# Patient Record
Sex: Female | Born: 1945
Health system: Southern US, Community
[De-identification: ages and names within clinical notes are randomized; demographics above are authoritative.]

## PROBLEM LIST (undated history)

## (undated) DIAGNOSIS — I10 Essential (primary) hypertension: Secondary | ICD-10-CM

## (undated) DIAGNOSIS — K219 Gastro-esophageal reflux disease without esophagitis: Secondary | ICD-10-CM

## (undated) DIAGNOSIS — N6019 Diffuse cystic mastopathy of unspecified breast: Secondary | ICD-10-CM

## (undated) DIAGNOSIS — R569 Unspecified convulsions: Secondary | ICD-10-CM

## (undated) DIAGNOSIS — K649 Unspecified hemorrhoids: Secondary | ICD-10-CM

## (undated) DIAGNOSIS — E785 Hyperlipidemia, unspecified: Secondary | ICD-10-CM

## (undated) DIAGNOSIS — N183 Chronic kidney disease, stage 3 unspecified: Secondary | ICD-10-CM

## (undated) DIAGNOSIS — R002 Palpitations: Secondary | ICD-10-CM

## (undated) DIAGNOSIS — G4733 Obstructive sleep apnea (adult) (pediatric): Secondary | ICD-10-CM

## (undated) DIAGNOSIS — F329 Major depressive disorder, single episode, unspecified: Secondary | ICD-10-CM

## (undated) DIAGNOSIS — R51 Headache: Secondary | ICD-10-CM

## (undated) DIAGNOSIS — F32A Depression, unspecified: Secondary | ICD-10-CM

## (undated) DIAGNOSIS — R519 Headache, unspecified: Secondary | ICD-10-CM

## (undated) HISTORY — PX: CARPAL TUNNEL RELEASE: SHX101

## (undated) HISTORY — DX: Gastro-esophageal reflux disease without esophagitis: K21.9

## (undated) HISTORY — DX: Essential (primary) hypertension: I10

## (undated) HISTORY — DX: Chronic kidney disease, stage 3 unspecified: N18.30

## (undated) HISTORY — PX: BREAST BIOPSY: SHX20

## (undated) HISTORY — DX: Diffuse cystic mastopathy of unspecified breast: N60.19

## (undated) HISTORY — PX: TRIGGER FINGER RELEASE: SHX641

## (undated) HISTORY — DX: Obstructive sleep apnea (adult) (pediatric): G47.33

## (undated) HISTORY — DX: Palpitations: R00.2

## (undated) HISTORY — DX: Unspecified hemorrhoids: K64.9

---
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
## 2004-09-21 ENCOUNTER — Encounter: Payer: Self-pay | Admitting: Family Medicine

## 2004-10-22 ENCOUNTER — Encounter: Payer: Self-pay | Admitting: Family Medicine

## 2004-11-21 ENCOUNTER — Encounter: Payer: Self-pay | Admitting: Family Medicine

## 2004-12-22 ENCOUNTER — Encounter: Payer: Self-pay | Admitting: Family Medicine

## 2004-12-22 HISTORY — PX: TOE SURGERY: SHX1073

## 2004-12-22 HISTORY — PX: ANKLE SURGERY: SHX546

## 2005-01-08 ENCOUNTER — Inpatient Hospital Stay: Payer: Self-pay | Admitting: Internal Medicine

## 2005-01-16 ENCOUNTER — Ambulatory Visit: Payer: Self-pay | Admitting: Obstetrics and Gynecology

## 2005-01-22 ENCOUNTER — Encounter: Payer: Self-pay | Admitting: Family Medicine

## 2005-02-19 ENCOUNTER — Encounter: Payer: Self-pay | Admitting: Family Medicine

## 2005-03-04 ENCOUNTER — Ambulatory Visit: Payer: Self-pay | Admitting: Obstetrics and Gynecology

## 2005-03-22 ENCOUNTER — Encounter: Payer: Self-pay | Admitting: Family Medicine

## 2005-03-24 ENCOUNTER — Inpatient Hospital Stay: Payer: Self-pay | Admitting: Specialist

## 2005-11-04 ENCOUNTER — Ambulatory Visit: Payer: Self-pay | Admitting: Internal Medicine

## 2005-12-11 ENCOUNTER — Ambulatory Visit: Payer: Self-pay | Admitting: Otolaryngology

## 2005-12-16 ENCOUNTER — Inpatient Hospital Stay: Payer: Self-pay | Admitting: Internal Medicine

## 2005-12-16 ENCOUNTER — Other Ambulatory Visit: Payer: Self-pay

## 2006-01-06 ENCOUNTER — Ambulatory Visit: Payer: Self-pay | Admitting: Internal Medicine

## 2006-01-16 ENCOUNTER — Ambulatory Visit: Payer: Self-pay

## 2006-01-29 ENCOUNTER — Ambulatory Visit: Payer: Self-pay | Admitting: Internal Medicine

## 2006-02-09 ENCOUNTER — Ambulatory Visit: Payer: Self-pay | Admitting: Internal Medicine

## 2006-02-13 ENCOUNTER — Ambulatory Visit: Payer: Self-pay | Admitting: Internal Medicine

## 2006-03-02 ENCOUNTER — Ambulatory Visit: Payer: Self-pay | Admitting: Internal Medicine

## 2006-04-02 ENCOUNTER — Inpatient Hospital Stay: Payer: Self-pay | Admitting: Internal Medicine

## 2006-04-02 ENCOUNTER — Other Ambulatory Visit: Payer: Self-pay

## 2006-04-18 ENCOUNTER — Ambulatory Visit: Payer: Self-pay | Admitting: Neurology

## 2006-04-27 ENCOUNTER — Encounter: Payer: Self-pay | Admitting: Internal Medicine

## 2006-05-22 ENCOUNTER — Encounter: Payer: Self-pay | Admitting: Internal Medicine

## 2006-06-21 ENCOUNTER — Encounter: Payer: Self-pay | Admitting: Internal Medicine

## 2006-07-22 ENCOUNTER — Encounter: Payer: Self-pay | Admitting: Internal Medicine

## 2006-08-22 ENCOUNTER — Encounter: Payer: Self-pay | Admitting: Internal Medicine

## 2006-09-21 ENCOUNTER — Encounter: Payer: Self-pay | Admitting: Internal Medicine

## 2006-10-22 ENCOUNTER — Encounter: Payer: Self-pay | Admitting: Internal Medicine

## 2006-11-21 ENCOUNTER — Encounter: Payer: Self-pay | Admitting: Internal Medicine

## 2006-11-23 ENCOUNTER — Ambulatory Visit: Payer: Self-pay | Admitting: Specialist

## 2006-12-22 ENCOUNTER — Encounter: Payer: Self-pay | Admitting: Internal Medicine

## 2006-12-22 HISTORY — PX: COLONOSCOPY: SHX174

## 2007-01-06 ENCOUNTER — Ambulatory Visit: Payer: Self-pay | Admitting: General Surgery

## 2007-01-22 ENCOUNTER — Encounter: Payer: Self-pay | Admitting: Internal Medicine

## 2007-02-20 ENCOUNTER — Encounter: Payer: Self-pay | Admitting: Internal Medicine

## 2007-02-24 ENCOUNTER — Ambulatory Visit: Payer: Self-pay | Admitting: Obstetrics and Gynecology

## 2007-03-09 ENCOUNTER — Encounter: Payer: Self-pay | Admitting: Specialist

## 2007-03-23 ENCOUNTER — Encounter: Payer: Self-pay | Admitting: Internal Medicine

## 2007-03-23 ENCOUNTER — Encounter: Payer: Self-pay | Admitting: Specialist

## 2007-04-12 ENCOUNTER — Ambulatory Visit: Payer: Self-pay | Admitting: Gastroenterology

## 2007-04-22 ENCOUNTER — Encounter: Payer: Self-pay | Admitting: Internal Medicine

## 2007-05-23 ENCOUNTER — Encounter: Payer: Self-pay | Admitting: Internal Medicine

## 2007-06-22 ENCOUNTER — Encounter: Payer: Self-pay | Admitting: Internal Medicine

## 2008-01-05 ENCOUNTER — Ambulatory Visit: Payer: Self-pay | Admitting: General Surgery

## 2008-02-24 ENCOUNTER — Encounter: Payer: Self-pay | Admitting: Specialist

## 2008-03-16 ENCOUNTER — Ambulatory Visit: Payer: Self-pay | Admitting: Specialist

## 2008-03-21 ENCOUNTER — Ambulatory Visit: Payer: Self-pay | Admitting: Internal Medicine

## 2008-03-22 ENCOUNTER — Encounter: Payer: Self-pay | Admitting: Specialist

## 2008-04-21 ENCOUNTER — Encounter: Payer: Self-pay | Admitting: Specialist

## 2008-07-17 ENCOUNTER — Ambulatory Visit: Payer: Self-pay | Admitting: Internal Medicine

## 2008-07-31 ENCOUNTER — Ambulatory Visit: Payer: Self-pay | Admitting: Internal Medicine

## 2008-08-04 ENCOUNTER — Ambulatory Visit: Payer: Self-pay | Admitting: Internal Medicine

## 2008-12-22 HISTORY — PX: MOUTH SURGERY: SHX715

## 2009-01-08 ENCOUNTER — Ambulatory Visit: Payer: Self-pay | Admitting: Obstetrics and Gynecology

## 2009-01-26 ENCOUNTER — Ambulatory Visit: Payer: Self-pay | Admitting: Internal Medicine

## 2009-03-21 ENCOUNTER — Encounter: Payer: Self-pay | Admitting: Internal Medicine

## 2009-03-22 ENCOUNTER — Encounter: Payer: Self-pay | Admitting: Internal Medicine

## 2009-04-21 ENCOUNTER — Encounter: Payer: Self-pay | Admitting: Internal Medicine

## 2009-05-22 ENCOUNTER — Encounter: Payer: Self-pay | Admitting: Internal Medicine

## 2009-08-01 ENCOUNTER — Other Ambulatory Visit: Payer: Self-pay | Admitting: Internal Medicine

## 2010-01-14 ENCOUNTER — Ambulatory Visit: Payer: Self-pay | Admitting: General Surgery

## 2010-01-17 ENCOUNTER — Ambulatory Visit: Payer: Self-pay | Admitting: Internal Medicine

## 2010-01-23 ENCOUNTER — Ambulatory Visit: Payer: Self-pay | Admitting: Internal Medicine

## 2010-07-18 ENCOUNTER — Other Ambulatory Visit: Payer: Self-pay | Admitting: Internal Medicine

## 2010-07-24 ENCOUNTER — Ambulatory Visit: Payer: Self-pay | Admitting: Internal Medicine

## 2011-01-27 ENCOUNTER — Other Ambulatory Visit: Payer: Self-pay | Admitting: Internal Medicine

## 2011-01-28 ENCOUNTER — Ambulatory Visit: Payer: Self-pay | Admitting: Internal Medicine

## 2011-04-02 ENCOUNTER — Ambulatory Visit: Payer: Self-pay | Admitting: General Practice

## 2012-01-27 ENCOUNTER — Other Ambulatory Visit: Payer: Self-pay | Admitting: Internal Medicine

## 2012-01-27 LAB — BASIC METABOLIC PANEL
Anion Gap: 9 (ref 7–16)
BUN: 15 mg/dL (ref 7–18)
Calcium, Total: 9 mg/dL (ref 8.5–10.1)
Chloride: 105 mmol/L (ref 98–107)
Co2: 28 mmol/L (ref 21–32)
Creatinine: 0.9 mg/dL (ref 0.60–1.30)
EGFR (Non-African Amer.): >60
Osmolality: 283 (ref 275–301)
Sodium: 142 mmol/L (ref 136–145)

## 2012-02-02 ENCOUNTER — Ambulatory Visit: Payer: Self-pay | Admitting: General Surgery

## 2012-02-04 ENCOUNTER — Ambulatory Visit: Payer: Self-pay | Admitting: Internal Medicine

## 2013-02-02 ENCOUNTER — Ambulatory Visit: Payer: Self-pay | Admitting: General Surgery

## 2014-02-09 ENCOUNTER — Ambulatory Visit: Payer: Self-pay | Admitting: General Surgery

## 2014-02-12 ENCOUNTER — Encounter: Payer: Self-pay | Admitting: General Surgery

## 2014-02-23 ENCOUNTER — Ambulatory Visit (INDEPENDENT_AMBULATORY_CARE_PROVIDER_SITE_OTHER): Payer: Medicare HMO | Admitting: General Surgery

## 2014-02-23 ENCOUNTER — Encounter: Payer: Self-pay | Admitting: General Surgery

## 2014-02-23 VITALS — BP 130/74 | HR 72 | Resp 14 | Ht 64.0 in | Wt 222.0 lb

## 2014-02-23 DIAGNOSIS — Z1239 Encounter for other screening for malignant neoplasm of breast: Secondary | ICD-10-CM

## 2014-02-23 DIAGNOSIS — N6019 Diffuse cystic mastopathy of unspecified breast: Secondary | ICD-10-CM

## 2014-02-23 NOTE — Patient Instructions (Addendum)
Patient to return to return in one year bilateral screening mammogram. Patient to continue to perform self breast exams monthy.

## 2014-02-23 NOTE — Progress Notes (Signed)
Patient ID: Abigail Moody, female   DOB: [birth year], 68 y.o.   MRN: [id number]  Chief Complaint  Patient presents with  . Follow-up    1 year follow up bilateral screening mammogram    HPI Abigail Moody is a 68 y.o. female who presents for a breast evaluation. The most recent mammogram was done on 02/08/14. Patient does perform regular self breast checks and gets regular mammograms done.    HPI  Past Medical History  Diagnosis Date  . Hypertension   . Diffuse cystic mastopathy   . GERD (gastroesophageal reflux disease)   . Hemorrhoid     Past Surgical History  Procedure Laterality Date  . Carpal tunnel release Bilateral   . Ankle surgery Right 2006  . Mouth surgery  2010  . Toe surgery Left 2006  . Breast biopsy Bilateral   . Tonsillectomy  1959  . Colonoscopy  2008    Dr. Epifanio Lesches  . Trigger finger release Right 2013-2014?    Family History  Problem Relation Age of Onset  . Cancer Father     Social History History  Substance Use Topics  . Smoking status: Never Smoker   . Smokeless tobacco: Never Used  . Alcohol Use: Yes    Allergies  Allergen Reactions  . Erythromycin Itching  . Levaquin [Levofloxacin] Other (See Comments)    Lethargic, no energy, cannot function for daily activities per patient.     Current Outpatient Prescriptions  Medication Sig Dispense Refill  . aspirin 325 MG tablet Take 325 mg by mouth daily.      . calcium-vitamin D 250-100 MG-UNIT per tablet Take 1 tablet by mouth 2 (two) times daily.      . fluticasone (FLONASE) 50 MCG/ACT nasal spray       . meloxicam (MOBIC) 15 MG tablet Take 15 mg by mouth daily.      . Multiple Vitamins-Minerals (MULTIVITAMIN WITH MINERALS) tablet Take 1 tablet by mouth daily.      Marland Kitchen PROAIR HFA 108 (90 BASE) MCG/ACT inhaler        No current facility-administered medications for this visit.    Review of Systems Review of Systems  Constitutional: Negative.   Respiratory: Negative.    Cardiovascular: Negative.     Blood pressure 130/74, pulse 72, resp. rate 14, height 5\' 4"  (1.626 m), weight 222 lb (100.699 kg).  Physical Exam Physical Exam  Constitutional: She is oriented to person, place, and time. She appears well-developed and well-nourished.  Eyes: Conjunctivae are normal.  Neck: Neck supple. No mass and no thyromegaly present.  Cardiovascular: Normal rate, regular rhythm and normal heart sounds.   Pulmonary/Chest: Breath sounds normal. Right breast exhibits no inverted nipple, no mass, no nipple discharge, no skin change and no tenderness. Left breast exhibits no inverted nipple, no mass, no nipple discharge, no skin change and no tenderness.  Lymphadenopathy:    She has no cervical adenopathy.    She has no axillary adenopathy.  Neurological: She is alert and oriented to person, place, and time.  Skin: Skin is warm and dry.    Data Reviewed Mammogram reviewed   Assessment    Stable exam FCD, prior biopsies    Plan    Patient to return to return in one year bilateral screening mammogram.       Bon Dowis G 02/24/2014, 6:28 AM

## 2014-02-24 ENCOUNTER — Encounter: Payer: Self-pay | Admitting: General Surgery

## 2014-02-24 DIAGNOSIS — N6019 Diffuse cystic mastopathy of unspecified breast: Secondary | ICD-10-CM | POA: Insufficient documentation

## 2014-03-02 ENCOUNTER — Ambulatory Visit: Payer: Self-pay | Admitting: Internal Medicine

## 2015-02-12 ENCOUNTER — Ambulatory Visit: Payer: Self-pay | Admitting: General Surgery

## 2015-02-22 ENCOUNTER — Encounter: Payer: Self-pay | Admitting: General Surgery

## 2015-02-22 ENCOUNTER — Ambulatory Visit (INDEPENDENT_AMBULATORY_CARE_PROVIDER_SITE_OTHER): Payer: PPO | Admitting: General Surgery

## 2015-02-22 VITALS — BP 124/82 | HR 64 | Resp 12 | Ht 64.0 in | Wt 212.0 lb

## 2015-02-22 DIAGNOSIS — Z87898 Personal history of other specified conditions: Secondary | ICD-10-CM | POA: Diagnosis not present

## 2015-02-22 DIAGNOSIS — Z1239 Encounter for other screening for malignant neoplasm of breast: Secondary | ICD-10-CM | POA: Diagnosis not present

## 2015-02-22 DIAGNOSIS — Z86018 Personal history of other benign neoplasm: Secondary | ICD-10-CM

## 2015-02-22 NOTE — Patient Instructions (Addendum)
Continue self breast exams. Call office for any new breast issues or concerns. Patient to return in one year with bilateral screening mammogram

## 2015-02-22 NOTE — Progress Notes (Signed)
Patient ID: Abigail Moody, female   DOB: 12-Oct-1946, 69 y.o.   MRN: [id number]  Chief Complaint  Patient presents with  . Follow-up    mammogram    HPI Abigail Moody is a 69 y.o. female.  who presents for her follow up breast evaluation. The most recent mammogram was done on 02-12-15.  Patient does perform regular self breast checks and gets regular mammograms done.  No new breast issues.  HPI  Past Medical History  Diagnosis Date  . Hypertension   . Diffuse cystic mastopathy   . GERD (gastroesophageal reflux disease)   . Hemorrhoid     Past Surgical History  Procedure Laterality Date  . Carpal tunnel release Bilateral   . Ankle surgery Right 2006  . Mouth surgery  2010  . Toe surgery Left 2006  . Breast biopsy Bilateral   . Tonsillectomy  1959  . Colonoscopy  2008    Dr. Epifanio Lesches  . Trigger finger release Right 2013-2014?    Family History  Problem Relation Age of Onset  . Cancer Father     prostate  . Cancer Brother     half brother/lung    Social History History  Substance Use Topics  . Smoking status: Never Smoker   . Smokeless tobacco: Never Used  . Alcohol Use: Yes    Allergies  Allergen Reactions  . Erythromycin Itching  . Levaquin [Levofloxacin] Other (See Comments)    Lethargic, no energy, cannot function for daily activities per patient.     Current Outpatient Prescriptions  Medication Sig Dispense Refill  . aspirin 325 MG tablet Take 325 mg by mouth daily.    . calcium-vitamin D 250-100 MG-UNIT per tablet Take 1 tablet by mouth 2 (two) times daily.    Marland Kitchen escitalopram (LEXAPRO) 10 MG tablet Take 10 mg by mouth daily.    . fluticasone (FLONASE) 50 MCG/ACT nasal spray     . meloxicam (MOBIC) 15 MG tablet Take 15 mg by mouth daily.    . Multiple Vitamins-Minerals (MULTIVITAMIN WITH MINERALS) tablet Take 1 tablet by mouth daily.    Marland Kitchen PROAIR HFA 108 (90 BASE) MCG/ACT inhaler      No current facility-administered medications for this visit.     Review of Systems Review of Systems  Constitutional: Negative.   Respiratory: Negative.   Cardiovascular: Negative.     Blood pressure 124/82, pulse 64, resp. rate 12, height 5\' 4"  (1.626 m), weight 212 lb (96.163 kg).  Physical Exam Physical Exam  Constitutional: She is oriented to person, place, and time. She appears well-developed and well-nourished.  Eyes: Conjunctivae are normal. No scleral icterus.  Neck: Neck supple.  Cardiovascular: Normal rate, regular rhythm and normal heart sounds.   Pulmonary/Chest: Effort normal and breath sounds normal. Right breast exhibits no inverted nipple, no mass, no nipple discharge, no skin change and no tenderness. Left breast exhibits no inverted nipple, no mass, no nipple discharge, no skin change and no tenderness.  Abdominal: Soft. Normal appearance and bowel sounds are normal. There is no hepatomegaly. There is no tenderness.  Lymphadenopathy:    She has no cervical adenopathy.    She has no axillary adenopathy.  Neurological: She is alert and oriented to person, place, and time.  Skin: Skin is warm and dry.  Multiple skin tags and keratotic lesions over trunk, neck-none suspicious  Data Reviewed Mammogram reviewed and stable.  Assessment    Stable physical exam. History of benign breast lumps.  Plan     Patient to return in one year with bilateral screening mammogram.         Junie Panning G 02/22/2015, 5:00 PM

## 2015-02-27 ENCOUNTER — Encounter: Payer: Self-pay | Admitting: General Surgery

## 2015-11-30 ENCOUNTER — Encounter: Payer: Self-pay | Admitting: Emergency Medicine

## 2015-11-30 ENCOUNTER — Emergency Department
Admission: EM | Admit: 2015-11-30 | Discharge: 2015-11-30 | Disposition: A | Discharge status: Home / Self Care | Payer: PRIVATE HEALTH INSURANCE | Attending: Emergency Medicine | Admitting: Emergency Medicine

## 2015-11-30 ENCOUNTER — Emergency Department: Payer: PRIVATE HEALTH INSURANCE

## 2015-11-30 DIAGNOSIS — I10 Essential (primary) hypertension: Secondary | ICD-10-CM | POA: Insufficient documentation

## 2015-11-30 DIAGNOSIS — M75101 Unspecified rotator cuff tear or rupture of right shoulder, not specified as traumatic: Secondary | ICD-10-CM | POA: Diagnosis not present

## 2015-11-30 DIAGNOSIS — S4991XA Unspecified injury of right shoulder and upper arm, initial encounter: Secondary | ICD-10-CM | POA: Diagnosis present

## 2015-11-30 DIAGNOSIS — Y9389 Activity, other specified: Secondary | ICD-10-CM | POA: Diagnosis not present

## 2015-11-30 DIAGNOSIS — Y9289 Other specified places as the place of occurrence of the external cause: Secondary | ICD-10-CM | POA: Insufficient documentation

## 2015-11-30 DIAGNOSIS — Y998 Other external cause status: Secondary | ICD-10-CM | POA: Insufficient documentation

## 2015-11-30 DIAGNOSIS — Z791 Long term (current) use of non-steroidal anti-inflammatories (NSAID): Secondary | ICD-10-CM | POA: Diagnosis not present

## 2015-11-30 DIAGNOSIS — Z79899 Other long term (current) drug therapy: Secondary | ICD-10-CM | POA: Diagnosis not present

## 2015-11-30 DIAGNOSIS — W010XXA Fall on same level from slipping, tripping and stumbling without subsequent striking against object, initial encounter: Secondary | ICD-10-CM | POA: Insufficient documentation

## 2015-11-30 DIAGNOSIS — Z7982 Long term (current) use of aspirin: Secondary | ICD-10-CM | POA: Diagnosis not present

## 2015-11-30 MED ORDER — ETODOLAC 500 MG PO TABS: 500.0000 mg | ORAL_TABLET | Freq: Two times a day (BID) | ORAL | Status: DC

## 2015-11-30 NOTE — ED Notes (Signed)
Patient presents to the ED after falling.  Patient states she was getting on the elevator to go home from work and the elevator wasn't level with the floor and patient states she tripped and fell into the elevator.  Patient is complaining of right shoulder pain, and right breast pain.  Patient denies loss of consciousness, denies hitting her head.

## 2015-11-30 NOTE — ED Provider Notes (Addendum)
CSN: [id number]     Arrival date & time 11/30/15  1817 History   First MD Initiated Contact with Patient 11/30/15 1904     Chief Complaint  Patient presents with  . Fall     (Consider location/radiation/quality/duration/timing/severity/associated sxs/prior Treatment) HPI  69 year old female was leaving to go home from work at [employer] when she tripped getting onto the elevator. Patient states the elevator was higher than the entrance to the elevator. She tripped, fell landing onto her right side. She complained of mild right breast pain that has improved as well as right shoulder pain along the lateral deltoid that is currently 2 out of 10. Pain is sharp, increased with in the right shoulder with attempted abduction or flexion. She gets relief with the arm by her side.  She has limited range of motion of the right shoulder. She denies any previous injury or surgeries to the right shoulder. She denies any head injury, neck pain, back pain or lower extremity discomfort. No loss of consciousness, nausea, vomiting. No numbness tingling or weakness in the upper or lower extremities. She has not had any medications for pain.  Past Medical History  Diagnosis Date  . Hypertension   . Diffuse cystic mastopathy   . GERD (gastroesophageal reflux disease)   . Hemorrhoid    Past Surgical History  Procedure Laterality Date  . Carpal tunnel release Bilateral   . Ankle surgery Right 2006  . Mouth surgery  2010  . Toe surgery Left 2006  . Breast biopsy Bilateral   . Tonsillectomy  1959  . Colonoscopy  2008    Dr. Epifanio Lesches  . Trigger finger release Right 2013-2014?   Family History  Problem Relation Age of Onset  . Cancer Father     prostate  . Cancer Brother     half brother/lung   Social History  Substance Use Topics  . Smoking status: Never Smoker   . Smokeless tobacco: Never Used  . Alcohol Use: Yes   OB History    No data available     Review of Systems  Constitutional: Negative for  fever, chills, activity change and fatigue.  HENT: Negative for congestion, sinus pressure and sore throat.   Eyes: Negative for visual disturbance.  Respiratory: Negative for cough, chest tightness and shortness of breath.   Cardiovascular: Negative for chest pain and leg swelling.  Gastrointestinal: Negative for nausea, vomiting, abdominal pain and diarrhea.  Genitourinary: Negative for dysuria.  Musculoskeletal: Positive for arthralgias. Negative for gait problem.  Skin: Negative for rash.  Neurological: Negative for weakness, numbness and headaches.  Hematological: Negative for adenopathy.  Psychiatric/Behavioral: Negative for behavioral problems, confusion and agitation.      Allergies  Erythromycin and Levaquin  Home Medications   Prior to Admission medications   Medication Sig Start Date End Date Taking? Authorizing Provider  aspirin 325 MG tablet Take 325 mg by mouth daily.    Historical Provider, MD  calcium-vitamin D 250-100 MG-UNIT per tablet Take 1 tablet by mouth 2 (two) times daily.    Historical Provider, MD  escitalopram (LEXAPRO) 10 MG tablet Take 10 mg by mouth daily.    Historical Provider, MD  etodolac (LODINE) 500 MG tablet Take 1 tablet (500 mg total) by mouth 2 (two) times daily. 11/30/15   Duanne Guess, PA-C  fluticasone Asencion Islam) 50 MCG/ACT nasal spray  02/12/14   Historical Provider, MD  meloxicam (MOBIC) 15 MG tablet Take 15 mg by mouth daily.    Historical Provider,  MD  Multiple Vitamins-Minerals (MULTIVITAMIN WITH MINERALS) tablet Take 1 tablet by mouth daily.    Historical Provider, MD  PROAIR HFA 108 (90 BASE) MCG/ACT inhaler  01/28/14   Historical Provider, MD   BP 169/88 mmHg  Pulse 78  Temp(Src) 98.5 F (36.9 C) (Oral)  Resp 16  Ht 5\' 4"  (1.626 m)  Wt 90.719 kg  BMI 34.31 kg/m2  SpO2 95% Physical Exam  Constitutional: She is oriented to person, place, and time. She appears well-developed and well-nourished. No distress.  HENT:  Head:  Normocephalic and atraumatic.  Mouth/Throat: Oropharynx is clear and moist.  Eyes: EOM are normal. Pupils are equal, round, and reactive to light. Right eye exhibits no discharge. Left eye exhibits no discharge.  Neck: Normal range of motion. Neck supple.  Cardiovascular: Normal rate and intact distal pulses.   Pulmonary/Chest: Effort normal. No respiratory distress.  Musculoskeletal:  Right Upper Extremity: Examination of the right shoulder and arm showed no bony abnormality or edema.  Patient has 60 of active abduction and flexion. Passively she can be increased to 100 of abduction and flexion..  The patient has moderate pain with shoulder motion, mostly abduction and flexion.  The patient has a mildly positive Hawkins test and a positive Impingement test. The patient is minimally along the deltoid muscle.  There is mild subacromial space tenderness with no AC joint tenderness.  The patient has no instability of the shoulder with anterior-posterior motion.  There is a negative sulcus sign.  The rotator cuff muscle strength is 4/5 with supraspinatus, 5/5 with internal rotation, and 5/5 with external rotation.  There is no crepitus with range of motion activities.    Neurological: The patient has sensation that is intact to light touch and pinprick bilaterally.  The patient has normal grip strength.  The patient has full biceps, wrist extension, grip, and interosseous strength.  The patient has 2 + DTRs bilaterally.  Vascular: The patient has less than 2 second capillary refill.  The patient has normal ulnar and radial pulses.  The patient has normal warmth to touch.     Neurological: She is alert and oriented to person, place, and time. She has normal reflexes.  Skin: Skin is warm and dry.  Psychiatric: She has a normal mood and affect. Her behavior is normal. Thought content normal.    ED Course  Procedures (including critical care time) Labs Review Labs Reviewed - No data to  display  Imaging Review Dg Shoulder Right  11/30/2015  CLINICAL DATA:  Acute right shoulder pain after fall tonight. EXAM: RIGHT SHOULDER - 2+ VIEW COMPARISON:  None. FINDINGS: There is no evidence of fracture or dislocation. Mild degenerative changes seen involving the right acromioclavicular joint. Soft tissues are unremarkable. IMPRESSION: Mild degenerative joint disease of the right acromioclavicular joint. No acute abnormality seen in the right shoulder. Electronically Signed   By: Marijo Conception, M.D.   On: 11/30/2015 19:54   I have personally reviewed and evaluated these images and lab results as part of my medical decision-making.   EKG Interpretation None      MDM   Final diagnoses:  Rotator cuff syndrome, right    69 year old female fell at work, injured her right shoulder. She denies any other pain or injury to her body. X-rays of the right shoulder negative for any acute bony abnormality, she has not before meals joint osteoarthritis. Pain is currently mild but she does have limited range of motion and weakness with supraspinatus strength testing.  We will treat with etodolac 500 mg 1 tab by mouth twice a day. She will take with meals. She will follow-up with Dr. Sabra Heck in 5-7 days. Return to the ER for any worsening symptoms urgent changes in her health. Work on gentle range of motion of the right shoulder. Sling was dispensed to help with pain as needed. She will come out to work on pendulum exercises.    Duanne Guess, PA-C 11/30/15 2007  Nance Pear, MD 11/30/15 2012  Duanne Guess, PA-C 11/30/15 2016  Nance Pear, MD 11/30/15 [phone]

## 2015-11-30 NOTE — Discharge Instructions (Signed)
Rotator Cuff Injury °Rotator cuff injury is any type of injury to the set of muscles and tendons that make up the stabilizing unit of your shoulder. This unit holds the ball of your upper arm bone (humerus) in the socket of your shoulder blade (scapula).  °CAUSES °Injuries to your rotator cuff most commonly come from sports or activities that cause your arm to be moved repeatedly over your head. Examples of this include throwing, weight lifting, swimming, or racquet sports. Long lasting (chronic) irritation of your rotator cuff can cause soreness and swelling (inflammation), bursitis, and eventual damage to your tendons, such as a tear (rupture). °SIGNS AND SYMPTOMS °Acute rotator cuff tear: °· Sudden tearing sensation followed by severe pain shooting from your upper shoulder down your arm toward your elbow. °· Decreased range of motion of your shoulder because of pain and muscle spasm. °· Severe pain. °· Inability to raise your arm out to the side because of pain and loss of muscle power (large tears). °Chronic rotator cuff tear: °· Pain that usually is worse at night and may interfere with sleep. °· Gradual weakness and decreased shoulder motion as the pain worsens. °· Decreased range of motion. °Rotator cuff tendinitis:  °· Deep ache in your shoulder and the outside upper arm over your shoulder. °· Pain that comes on gradually and becomes worse when lifting your arm to the side or turning it inward. °DIAGNOSIS °Rotator cuff injury is diagnosed through a medical history, physical exam, and imaging exam. The medical history helps determine the type of rotator cuff injury. Your health care provider will look at your injured shoulder, feel the injured area, and ask you to move your shoulder in different positions. X-ray exams typically are done to rule out other causes of shoulder pain, such as fractures. MRI is the exam of choice for the most severe shoulder injuries because the images show muscles and tendons.    °TREATMENT  °Chronic tear: °· Medicine for pain, such as acetaminophen or ibuprofen. °· Physical therapy and range-of-motion exercises may be helpful in maintaining shoulder function and strength. °· Steroid injections into your shoulder joint. °· Surgical repair of the rotator cuff if the injury does not heal with noninvasive treatment. °Acute tear: °· Anti-inflammatory medicines such as ibuprofen and naproxen to help reduce pain and swelling. °· A sling to help support your arm and rest your rotator cuff muscles. Long-term use of a sling is not advised. It may cause significant stiffening of the shoulder joint. °· Surgery may be considered within a few weeks, especially in younger, active people, to return the shoulder to full function. °· Indications for surgical treatment include the following: °¨ Age younger than 60 years. °¨ Rotator cuff tears that are complete. °¨ Physical therapy, rest, and anti-inflammatory medicines have been used for 6-8 weeks, with no improvement. °¨ Employment or sporting activity that requires constant shoulder use. °Tendinitis: °· Anti-inflammatory medicines such as ibuprofen and naproxen to help reduce pain and swelling. °· A sling to help support your arm and rest your rotator cuff muscles. Long-term use of a sling is not advised. It may cause significant stiffening of the shoulder joint. °· Severe tendinitis may require: °¨ Steroid injections into your shoulder joint. °¨ Physical therapy. °¨ Surgery. °HOME CARE INSTRUCTIONS  °· Apply ice to your injury: °¨ Put ice in a plastic bag. °¨ Place a towel between your skin and the bag. °¨ Leave the ice on for 20 minutes, 2-3 times a day. °· If you   have a shoulder immobilizer (sling and straps), wear it until told otherwise by your health care provider. °· You may want to sleep on several pillows or in a recliner at night to lessen swelling and pain. °· Only take over-the-counter or prescription medicines for pain, discomfort, or fever as  directed by your health care provider. °· Do simple hand squeezing exercises with a soft rubber ball to decrease hand swelling. °SEEK MEDICAL CARE IF:  °· Your shoulder pain increases, or new pain or numbness develops in your arm, hand, or fingers. °· Your hand or fingers are colder than your other hand. °SEEK IMMEDIATE MEDICAL CARE IF:  °· Your arm, hand, or fingers are numb or tingling. °· Your arm, hand, or fingers are increasingly swollen and painful, or they turn white or blue. °MAKE SURE YOU: °· Understand these instructions. °· Will watch your condition. °· Will get help right away if you are not doing well or get worse. °  °This information is not intended to replace advice given to you by your health care provider. Make sure you discuss any questions you have with your health care provider. °  °Document Released: 12/05/2000 Document Revised: 12/13/2013 Document Reviewed: 07/20/2013 °Elsevier Interactive Patient Education ©2016 [employer] ° °

## 2015-12-06 ENCOUNTER — Other Ambulatory Visit: Payer: Self-pay | Admitting: *Deleted

## 2015-12-06 DIAGNOSIS — Z1231 Encounter for screening mammogram for malignant neoplasm of breast: Secondary | ICD-10-CM

## 2015-12-18 ENCOUNTER — Other Ambulatory Visit (HOSPITAL_COMMUNITY): Payer: Self-pay | Admitting: Family Medicine

## 2015-12-18 DIAGNOSIS — S4991XA Unspecified injury of right shoulder and upper arm, initial encounter: Secondary | ICD-10-CM

## 2015-12-19 ENCOUNTER — Ambulatory Visit (HOSPITAL_COMMUNITY): Payer: Self-pay

## 2015-12-31 ENCOUNTER — Other Ambulatory Visit (HOSPITAL_COMMUNITY): Payer: Self-pay

## 2015-12-31 ENCOUNTER — Ambulatory Visit (HOSPITAL_COMMUNITY)
Admission: RE | Admit: 2015-12-31 | Discharge: 2015-12-31 | Disposition: A | Discharge status: Home / Self Care | Payer: PRIVATE HEALTH INSURANCE | Source: Ambulatory Visit | Attending: Family Medicine | Admitting: Family Medicine

## 2015-12-31 DIAGNOSIS — M7551 Bursitis of right shoulder: Secondary | ICD-10-CM | POA: Diagnosis not present

## 2015-12-31 DIAGNOSIS — M25511 Pain in right shoulder: Secondary | ICD-10-CM | POA: Diagnosis not present

## 2015-12-31 DIAGNOSIS — S4991XA Unspecified injury of right shoulder and upper arm, initial encounter: Secondary | ICD-10-CM | POA: Insufficient documentation

## 2015-12-31 DIAGNOSIS — W010XXA Fall on same level from slipping, tripping and stumbling without subsequent striking against object, initial encounter: Secondary | ICD-10-CM | POA: Diagnosis not present

## 2015-12-31 DIAGNOSIS — M75101 Unspecified rotator cuff tear or rupture of right shoulder, not specified as traumatic: Secondary | ICD-10-CM | POA: Diagnosis not present

## 2015-12-31 DIAGNOSIS — M7581 Other shoulder lesions, right shoulder: Secondary | ICD-10-CM | POA: Diagnosis not present

## 2016-01-02 ENCOUNTER — Other Ambulatory Visit: Payer: Self-pay

## 2016-01-09 ENCOUNTER — Ambulatory Visit: Payer: PRIVATE HEALTH INSURANCE | Attending: Orthopedic Surgery

## 2016-01-09 DIAGNOSIS — M25511 Pain in right shoulder: Secondary | ICD-10-CM

## 2016-01-09 DIAGNOSIS — R293 Abnormal posture: Secondary | ICD-10-CM

## 2016-01-09 DIAGNOSIS — R531 Weakness: Secondary | ICD-10-CM | POA: Diagnosis present

## 2016-01-09 DIAGNOSIS — M75101 Unspecified rotator cuff tear or rupture of right shoulder, not specified as traumatic: Secondary | ICD-10-CM

## 2016-01-09 NOTE — Patient Instructions (Signed)
Postural education Ice 20 mins, 3 times a day Rest- if it hurst, don't do it.  Positioning with pillow at night, under shoulder and between body and hand for improved comfort for sleep.  PT POC

## 2016-01-09 NOTE — Therapy (Signed)
[place], [place], [address] Phone: [phone]   Fax:  [phone]  Physical Therapy Evaluation  Patient Details  Name: Abigail Moody MRN: [id number] Date of Birth: [birth year] Referring Provider: [name]  Encounter Date: 01/09/2016      PT End of Session - 01/09/16 1334    Visit Number 1   Number of Visits 16   Date for PT Re-Evaluation 03/04/16   PT Start Time 0930   PT Stop Time 1015   PT Time Calculation (min) 45 min   Activity Tolerance Patient tolerated treatment well;Patient limited by pain   Behavior During Therapy [facility] for tasks assessed/performed      Past Medical History  Diagnosis Date  . Hypertension   . Diffuse cystic mastopathy   . GERD (gastroesophageal reflux disease)   . Hemorrhoid     Past Surgical History  Procedure Laterality Date  . Carpal tunnel release Bilateral   . Ankle surgery Right 2006  . Mouth surgery  2010  . Toe surgery Left 2006  . Breast biopsy Bilateral   . Tonsillectomy  1959  . Colonoscopy  2008    Dr. Epifanio Lesches  . Trigger finger release Right 2013-2014?    There were no vitals filed for this visit.  Visit Diagnosis:  RCT (rotator cuff tear), right - Plan: PT plan of care cert/re-cert  Pain in joint of right shoulder - Plan: PT plan of care cert/re-cert  Poor posture - Plan: PT plan of care cert/re-cert  Weakness - Plan: PT plan of care cert/re-cert      Subjective Assessment - 01/09/16 0936    Subjective Pt injured R shoulder on Dec 9th, 2015 when pt tripped at work. Pt unable to recall how she fell or braced herself. Pt reports R shoulder pain has progressively worsened since. Pt saw MD on Jan 9th an received cortisone shot. Pt had Xray on Dec 9th and that was negative. Pt had MRI on Jan 9th which showed tendinosis of the supraspinatus, infraspinatous, and subscapularis tendons and partial thickness tears of supraspinatous and subscap  tendons.     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 Yes   Pain Score 3    Pain Location Shoulder  1-5/10    Pain Orientation Right   Pain Descriptors / Indicators Shooting;Sharp;Aching;Constant   Pain Type Acute pain   Pain Onset More than a month ago   Pain Frequency Constant   Aggravating Factors  Reaching, overhead activites   Pain Relieving Factors rest, ice, medication   Effect of Pain on Daily Activities unable to do hair,             [facility] PT Assessment - 01/09/16 0947    Assessment   Medical Diagnosis R RTC tear   Referring Provider S. Norris   Onset Date/Surgical Date 11/30/15   Hand Dominance Left   Next MD Visit 01/14/16   Prior Therapy none   Precautions   Precautions None   Balance Screen   Has the patient fallen in the past 6 months Yes   How many times? 2   Has the patient had a decrease in activity level because of a fear of falling?  No   Is the patient reluctant to leave their home because of a fear of falling?  No   Home [employer] residence   Prior Function   Level of Independence Independent   Observation/Other Assessments   Focus on Therapeutic Outcomes (FOTO)  Intake: 73% limited, Predicted 43% limited   Sensation   Additional Comments TPs at R bicep, UT, anterior shoulder ( greater tubercle.    Posture/Postural Control   Posture/Postural Control Postural limitations   Postural Limitations Rounded Shoulders;Forward head;Increased thoracic kyphosis   ROM / Strength   AROM / PROM / Strength AROM;PROM;Strength   AROM   AROM Assessment Site Shoulder   Right/Left Shoulder Right   Right Shoulder Flexion 60 Degrees   Right Shoulder ABduction 57 Degrees   Right Shoulder Internal Rotation --  FIR: T12   Right Shoulder External Rotation --  FER: C1    PROM   PROM Assessment Site Shoulder   Right/Left Shoulder Right   Right Shoulder Extension 45 Degrees   Right Shoulder Flexion 140 Degrees  ERP   Right Shoulder ABduction 45 Degrees  ERP   Right Shoulder Internal Rotation 65 Degrees  @ 45 ABD   Right Shoulder External Rotation 50 Degrees  @ 45 ABD, ERP   Strength   Strength Assessment Site Shoulder   Right/Left Shoulder Right   Right Shoulder Flexion 2-/5   Right Shoulder ABduction 2-/5   Right Shoulder Internal Rotation 3+/5   Right Shoulder External Rotation 2/5                   [facility] Adult PT Treatment/Exercise - 01/09/16 0947    Self-Care   Self-Care RICE;Posture;Heat/Ice Application;Other Self-Care Comments   RICE rest - if it hurts, don't do it.    Posture postural education    Heat/Ice Application ice 20 mins, 3 times a week.    Other Self-Care Comments  Positioning with pillow at night, under shoulder and between body and hand for improved comfort for sleep.    Modalities   Modalities Cryotherapy   Cryotherapy   Number Minutes Cryotherapy 10 Minutes   Cryotherapy Location Shoulder  R shoulder, pt supine.    Type of Cryotherapy Ice pack   Manual Therapy   Manual Therapy Joint mobilization;Soft tissue mobilization;Passive ROM   Soft tissue mobilization STM to R biceps    Passive ROM R shoulder all directions                 PT Education - 01/09/16 1333    Education provided Yes   Education Details see Pt instructions : Posture, ice, positioning, rest.    Person(s) Educated Patient   Methods Explanation;Demonstration   Comprehension Verbalized understanding          PT Short Term Goals - 01/09/16 1343    PT SHORT TERM GOAL #1   Title Pt will be I with initial HEP for continued strengthening and mobility by 02/09/16.    Time 4   Period Weeks   Status New   PT SHORT TERM GOAL #2   Title R shoulder AROM will improve to 90 degrees pain-free for improved overhead reaching activities by  02/09/16   Time 4   Period Weeks   Status New   PT SHORT TERM GOAL #3   Title Pt will be able to demo and verbalize proper posture as it related to upper body, shoulder, and cervical spine by 02/09/16.    Time 4   Period Weeks   Status New  PT Long Term Goals - 01/09/16 1345    PT LONG TERM GOAL #1   Title FOTO score will improve from 73% limited to <43% to demo improved function and mobility by 03/04/16.    Time 8   Period Weeks   Status New   PT LONG TERM GOAL #2   Title Pt will be able to lift 2# overhead x 10 reps without pain in order to return to pain free functional activities for lifting overhead by 03/04/16.    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    Time 8   Period Weeks   Status New   PT LONG TERM GOAL #4   Title Pt pain will be <2/10 throughout day while participating in ADLs and work by 03/04/16.    Time 8   Period Weeks   Status New               Plan - 01/09/16 1339    Clinical Impression Statement Pt fell at work on Dec 9th when she tripped and injured her R shoulder. Pt had MRI on Jan 9th which showed tendinosis of the supraspinatus, infraspinatous, and subscapularis tendons and partial thickness tears of supraspinatous and subscap tendons.  Signs and symptoms are compatible with this presentation with pain, impaired mobility/ROM, and impaired strength, which limit pt's functional abilities with reaching, lifting, carrying, dressing, grooming.  Pt will benefit from oupt PT for 3 times a week for 8 weeks for UE strengthening, stretching, pt education in order to address these impairments and functional limitations and return pt to pain-free PLOF.    Pt will benefit from skilled therapeutic intervention in order to improve on the following deficits Decreased activity tolerance;Decreased  coordination;Decreased endurance;Decreased range of motion;Decreased mobility;Decreased strength;Impaired UE functional use;Impaired flexibility;Impaired perceived functional ability;Postural dysfunction;Pain   Rehab Potential Fair   PT Frequency 3x / week   PT Duration 8 weeks   PT Treatment/Interventions ADLs/Self Care Home Management;Cryotherapy;Electrical Stimulation;Iontophoresis 4mg /ml Dexamethasone;Moist Heat;Ultrasound;Functional mobility training;Therapeutic activities;Therapeutic exercise;Neuromuscular re-education;Patient/family education;Taping;Dry needling;Passive range of motion;Manual techniques   PT Next Visit Plan Gentle PROM, Manual therapy technique, Est HEP: pendulums. sub max isometrics to tolerance.    PT Home Exercise Plan Prescribe next visit: pendulum. isometrics   Consulted and Agree with Plan of Care Patient         Problem List Patient Active Problem List   Diagnosis Date Noted  . Fibrocystic breast 02/24/2014    Dollene Cleveland , PT, DPT  01/09/2016, 1:58 PM  [facility] [address] [place], [place], [address] Phone: [phone]   Fax:  [phone]  Name: Abigail Moody MRN: [id number] Date of Birth: 03-15-1946

## 2016-01-14 ENCOUNTER — Ambulatory Visit: Payer: PRIVATE HEALTH INSURANCE

## 2016-01-14 DIAGNOSIS — M75101 Unspecified rotator cuff tear or rupture of right shoulder, not specified as traumatic: Secondary | ICD-10-CM | POA: Diagnosis not present

## 2016-01-14 DIAGNOSIS — R293 Abnormal posture: Secondary | ICD-10-CM

## 2016-01-14 DIAGNOSIS — M25511 Pain in right shoulder: Secondary | ICD-10-CM

## 2016-01-14 DIAGNOSIS — R531 Weakness: Secondary | ICD-10-CM

## 2016-01-14 NOTE — Patient Instructions (Signed)
HOLD 5 secs, DO 10 reps, 3 times a day. 10% of max effort (pain- free).   Strengthening: Isometric Flexion  Using wall for resistance, press right fist into ball using light pressure. Hold ____ seconds. Repeat ____ times per set. Do ____ sets per session. Do ____ sessions per day.  SHOULDER: Abduction (Isometric)  Use wall as resistance. Press arm against pillow. Keep elbow straight. Hold ___ seconds. ___ reps per set, ___ sets per day, ___ days per week  Extension (Isometric)  Place left bent elbow and back of arm against wall. Press elbow against wall. Hold ____ seconds. Repeat ____ times. Do ____ sessions per day.  Internal Rotation (Isometric)  Place palm of right fist against door frame, with elbow bent. Press fist against door frame. Hold ____ seconds. Repeat ____ times. Do ____ sessions per day.   External Rotation (Isometric)  Place back of left fist against door frame, with elbow bent. Press fist against door frame. Hold ____ seconds. Repeat ____ times. Do ____ sessions per day.  Copyright  VHI. All rights reserved.

## 2016-01-14 NOTE — Therapy (Signed)
[place], [place], [address] Phone: [phone]   Fax:  [phone]  Physical Therapy Treatment  Patient Details  Name: Abigail Moody MRN: [id number] Date of Birth: 1946-11-02 Referring Provider: [name]  Encounter Date: 01/14/2016      PT End of Session - 01/14/16 1735    Visit Number 2   Number of Visits 16   Date for PT Re-Evaluation 03/04/16   PT Start Time [id number]   PT Stop Time 1100   PT Time Calculation (min) 45 min   Activity Tolerance Patient tolerated treatment well;Patient limited by pain   Behavior During Therapy [facility] for tasks assessed/performed      Past Medical History  Diagnosis Date  . Hypertension   . Diffuse cystic mastopathy   . GERD (gastroesophageal reflux disease)   . Hemorrhoid     Past Surgical History  Procedure Laterality Date  . Carpal tunnel release Bilateral   . Ankle surgery Right 2006  . Mouth surgery  2010  . Toe surgery Left 2006  . Breast biopsy Bilateral   . Tonsillectomy  1959  . Colonoscopy  2008    Dr. Epifanio Lesches  . Trigger finger release Right 2013-2014?    There were no vitals filed for this visit.  Visit Diagnosis:  RCT (rotator cuff tear), right  Pain in joint of right shoulder  Poor posture  Weakness      Subjective Assessment - 01/14/16 1024    Subjective Pt reports it feels a little better since last visit. 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 Pain today rates 3/10 at rest dull and sharp with movements 6/10 . Pain continues to increase at night time . Pt reports  compliance with icing program.     Currently in Pain? Yes   Pain Score 3    Pain Location Shoulder   Pain Orientation Right   Pain Descriptors / Indicators Shooting;Sharp;Constant;Aching   Pain Type Acute pain   Pain Onset More than a month ago   Pain Frequency Constant                         [facility] Adult PT Treatment/Exercise  - 01/14/16 1038    Exercises   Exercises Shoulder   Shoulder Exercises: ROM/Strengthening   Pendulum 10 x each    Other ROM/Strengthening Exercises ball on table AAROM flexion and ER / IR x 10 reps each .    Shoulder Exercises: Isometric Strengthening   Flexion Other (comment)   Flexion Limitations 10 x 5 "   Extension Limitations 10 x 5 "   External Rotation Limitations 10 x 5 "   Internal Rotation Limitations 10 x 5 "   ABduction Limitations 10 x 5 "   Modalities   Modalities Cryotherapy   Cryotherapy   Number Minutes Cryotherapy 10 Minutes   Cryotherapy Location Shoulder   Type of Cryotherapy Ice pack   Manual Therapy   Manual Therapy Joint mobilization;Soft tissue mobilization;Passive ROM   Soft tissue mobilization STM to R biceps    Passive ROM R shoulder all directions                 PT Education - 01/14/16 1734    Education provided Yes   Education Details HEP- Isometrics    Person(s) Educated Patient   Methods Demonstration;Explanation   Comprehension Verbalized understanding;Returned demonstration;Tactile cues required  PT Short Term Goals - 01/09/16 1343    PT SHORT TERM GOAL #1   Title Pt will be I with initial HEP for continued strengthening and mobility by 02/09/16.    Time 4   Period Weeks   Status New   PT SHORT TERM GOAL #2   Title R shoulder AROM will improve to 90 degrees pain-free for improved overhead reaching activities by 02/09/16   Time 4   Period Weeks   Status New   PT SHORT TERM GOAL #3   Title Pt will be able to demo and verbalize proper posture as it related to upper body, shoulder, and cervical spine by 02/09/16.    Time 4   Period Weeks   Status New           PT Long Term Goals - 01/09/16 1345    PT LONG TERM GOAL #1   Title FOTO score will improve from 73% limited to <43% to demo improved function and mobility by 03/04/16.    Time 8   Period Weeks   Status New   PT LONG TERM GOAL #2   Title Pt will be able  to lift 2# overhead x 10 reps without pain in order to return to pain free functional activities for lifting overhead by 03/04/16.    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    Time 8   Period Weeks   Status New   PT LONG TERM GOAL #4   Title Pt pain will be <2/10 throughout day while participating in ADLs and work by 03/04/16.    Time 8   Period Weeks   Status New               Plan - 01/14/16 1031    Clinical Impression Statement Pt had difficulty coordinating pendulum exercises today instructed for HEP. Pt creates movement at shoulder versus with body, which activates mm and increased pain at shoulder. Performed passively and movment was pain- free. Pt able to perform isometrics pain free in all directions and prescribed for HEP .    PT Next Visit Plan Gentle PROM, Manual therapy technique, Review HEP. Prescribe pain- free AAROM    PT Home Exercise Plan isometrics and pendulum prescriebd this visit   Consulted and Agree with Plan of Care Patient        Problem List Patient Active Problem List   Diagnosis Date Noted  . Fibrocystic breast 02/24/2014    Abigail Moody, PT 01/14/2016, 5:39 PM  [facility] [address] [place], [place], [address] Phone: [phone]   Fax:  [phone]  Name: Abigail Moody MRN: [id number] Date of Birth: 08-23-1946

## 2016-01-16 ENCOUNTER — Ambulatory Visit: Payer: PRIVATE HEALTH INSURANCE

## 2016-01-16 DIAGNOSIS — R531 Weakness: Secondary | ICD-10-CM

## 2016-01-16 DIAGNOSIS — M75101 Unspecified rotator cuff tear or rupture of right shoulder, not specified as traumatic: Secondary | ICD-10-CM

## 2016-01-16 DIAGNOSIS — M25511 Pain in right shoulder: Secondary | ICD-10-CM

## 2016-01-16 DIAGNOSIS — R293 Abnormal posture: Secondary | ICD-10-CM

## 2016-01-16 NOTE — Therapy (Signed)
[place], [place], [address] Phone: [phone]   Fax:  [phone]  Physical Therapy Treatment  Patient Details  Name: Abigail Moody MRN: [id number] Date of Birth: 05-12-1946 Referring Provider: [name]  Encounter Date: 01/16/2016      PT End of Session - 01/16/16 1158    Visit Number 3   Number of Visits 16   PT Start Time 1150   PT Stop Time 1240   PT Time Calculation (min) 50 min   Activity Tolerance Patient tolerated treatment well;Patient limited by pain   Behavior During Therapy [facility] for tasks assessed/performed      Past Medical History  Diagnosis Date  . Hypertension   . Diffuse cystic mastopathy   . GERD (gastroesophageal reflux disease)   . Hemorrhoid     Past Surgical History  Procedure Laterality Date  . Carpal tunnel release Bilateral   . Ankle surgery Right 2006  . Mouth surgery  2010  . Toe surgery Left 2006  . Breast biopsy Bilateral   . Tonsillectomy  1959  . Colonoscopy  2008    Dr. Epifanio Lesches  . Trigger finger release Right 2013-2014?    There were no vitals filed for this visit.  Visit Diagnosis:  RCT (rotator cuff tear), right  Pain in joint of right shoulder  Poor posture  Weakness      Subjective Assessment - 01/16/16 1149    Subjective Pt reports of increased pain today. Pt admits to using it more. Discussed avoiding activities that may increase pain.    Currently in Pain? Yes   Pain Score 4    Pain Location Shoulder   Pain Orientation Right   Pain Descriptors / Indicators Shooting;Sharp;Aching   Pain Type Acute pain   Pain Onset More than a month ago   Pain Frequency Constant                         [facility] Adult PT Treatment/Exercise - 01/16/16 0001    Shoulder Exercises: Seated   Retraction 10 reps  HOLD 5 secs    Shoulder Exercises: Standing   Other Standing Exercises UE RANGER on floor 20 x  flexion and IR/ER   Shoulder  Exercises: ROM/Strengthening   Pendulum 20 x each   circles both directions , flex/ ext, side to side.    Other ROM/Strengthening Exercises ball on table AAROM flexion and ER / IR x 20 reps each .    Shoulder Exercises: Isometric Strengthening   Flexion Other (comment)   Flexion Limitations 10 x 5 "   Extension Limitations 10 x 5 "   External Rotation Limitations 10 x 5 "   Internal Rotation Limitations 10 x 5 "   ABduction Limitations 10 x 5 "   Modalities   Modalities Cryotherapy   Cryotherapy   Number Minutes Cryotherapy 10 Minutes   Cryotherapy Location Shoulder   Type of Cryotherapy Ice pack   Manual Therapy   Manual Therapy Joint mobilization;Soft tissue mobilization;Passive ROM   Soft tissue mobilization STM to R biceps    Passive ROM R shoulder all directions                 PT Education - 01/16/16 1157    Education provided Yes   Education Details Reviewed isometric HEP- gave written program this visit- forgot at last visit.    Person(s) Educated Patient   Methods Explanation;Demonstration   Comprehension  Need further instruction;Verbalized understanding;Returned demonstration          PT Short Term Goals - 01/09/16 1343    PT SHORT TERM GOAL #1   Title Pt will be I with initial HEP for continued strengthening and mobility by 02/09/16.    Time 4   Period Weeks   Status New   PT SHORT TERM GOAL #2   Title R shoulder AROM will improve to 90 degrees pain-free for improved overhead reaching activities by 02/09/16   Time 4   Period Weeks   Status New   PT SHORT TERM GOAL #3   Title Pt will be able to demo and verbalize proper posture as it related to upper body, shoulder, and cervical spine by 02/09/16.    Time 4   Period Weeks   Status New           PT Long Term Goals - 01/09/16 1345    PT LONG TERM GOAL #1   Title FOTO score will improve from 73% limited to <43% to demo improved function and mobility by 03/04/16.    Time 8   Period Weeks    Status New   PT LONG TERM GOAL #2   Title Pt will be able to lift 2# overhead x 10 reps without pain in order to return to pain free functional activities for lifting overhead by 03/04/16.    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    Time 8   Period Weeks   Status New   PT LONG TERM GOAL #4   Title Pt pain will be <2/10 throughout day while participating in ADLs and work by 03/04/16.    Time 8   Period Weeks   Status New               Plan - 01/16/16 1159    Clinical Impression Statement Revewed isometrics pain-free in all directions. No increase in pain noted. Pt tolerated isometrics better today without any pain. R Shoulder PROM painful initially with mm gaurding, but was abolished when able to relax mm.    PT Next Visit Plan Gentle PROM, Manual therapy technique, Review HEP. Prescribe pain- free AAROM including table slides next visit.    PT Home Exercise Plan isometrics prescribed this visit   Consulted and Agree with Plan of Care Patient        Problem List Patient Active Problem List   Diagnosis Date Noted  . Fibrocystic breast 02/24/2014    Dollene Cleveland , PT  01/16/2016, 12:38 PM  [facility] [address] [place], [place], [address] Phone: [phone]   Fax:  [phone]  Name: Abigail Moody MRN: [id number] Date of Birth: [birth year]

## 2016-01-18 ENCOUNTER — Ambulatory Visit: Payer: PRIVATE HEALTH INSURANCE | Admitting: Physical Therapy

## 2016-01-18 DIAGNOSIS — R531 Weakness: Secondary | ICD-10-CM

## 2016-01-18 DIAGNOSIS — M25511 Pain in right shoulder: Secondary | ICD-10-CM

## 2016-01-18 DIAGNOSIS — R293 Abnormal posture: Secondary | ICD-10-CM

## 2016-01-18 DIAGNOSIS — M75101 Unspecified rotator cuff tear or rupture of right shoulder, not specified as traumatic: Secondary | ICD-10-CM

## 2016-01-18 NOTE — Therapy (Signed)
[place], [place], [address] Phone: [phone]   Fax:  [phone]  Physical Therapy Treatment  Patient Details  Name: Abigail Moody MRN: [id number] Date of Birth: 18-Aug-1946 Referring Provider: [name]  Encounter Date: 01/18/2016      PT End of Session - 01/18/16 1056    Visit Number 4   Number of Visits 16   Date for PT Re-Evaluation 03/04/16   PT Start Time 0931   PT Stop Time 1021   PT Time Calculation (min) 50 min   Activity Tolerance Patient tolerated treatment well;Patient limited by pain   Behavior During Therapy [facility] for tasks assessed/performed      Past Medical History  Diagnosis Date  . Hypertension   . Diffuse cystic mastopathy   . GERD (gastroesophageal reflux disease)   . Hemorrhoid     Past Surgical History  Procedure Laterality Date  . Carpal tunnel release Bilateral   . Ankle surgery Right 2006  . Mouth surgery  2010  . Toe surgery Left 2006  . Breast biopsy Bilateral   . Tonsillectomy  1959  . Colonoscopy  2008    Dr. Epifanio Lesches  . Trigger finger release Right 2013-2014?    There were no vitals filed for this visit.  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 Yes   Pain Score 4    Pain Location Shoulder   Pain Orientation Right   Pain Descriptors / Indicators Sore   Pain Type Acute pain   Pain Onset More than a month ago   Pain Frequency Constant   Aggravating Factors  reaching, overhead activities   Pain Relieving Factors ice, rest, meds                         [facility] Adult PT Treatment/Exercise - 01/18/16 0945    Shoulder Exercises: Supine   External Rotation Right;AAROM;10 reps   External  Rotation Limitations with cane   Flexion AAROM;10 reps;Right   Flexion Limitations limited motion due to pain; cane   Other Supine Exercises chest press with cane x 10   Shoulder Exercises: Pulleys   Flexion 2 minutes   ABduction 2 minutes   ABduction Limitations scaption   Shoulder Exercises: Isometric Strengthening   Flexion Limitations 10 x 5 "   Extension Limitations 10 x 5 "   External Rotation Limitations 10 x 5 "   Internal Rotation Limitations 10 x 5 "   Cryotherapy   Number Minutes Cryotherapy 10 Minutes   Cryotherapy Location Shoulder   Type of Cryotherapy Ice pack   Manual Therapy   Passive ROM R shoulder all directions                   PT Short Term Goals - 01/09/16 1343    PT SHORT TERM GOAL #1   Title Pt will be I with initial HEP for continued strengthening and mobility by 02/09/16.    Time 4   Period Weeks   Status New   PT SHORT TERM GOAL #2   Title R shoulder AROM will improve to 90 degrees pain-free for improved overhead reaching activities by  02/09/16   Time 4   Period Weeks   Status New   PT SHORT TERM GOAL #3   Title Pt will be able to demo and verbalize proper posture as it related to upper body, shoulder, and cervical spine by 02/09/16.    Time 4   Period Weeks   Status New           PT Long Term Goals - 01/09/16 1345    PT LONG TERM GOAL #1   Title FOTO score will improve from 73% limited to <43% to demo improved function and mobility by 03/04/16.    Time 8   Period Weeks   Status New   PT LONG TERM GOAL #2   Title Pt will be able to lift 2# overhead x 10 reps without pain in order to return to pain free functional activities for lifting overhead by 03/04/16.    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    Time 8   Period Weeks   Status New   PT LONG TERM GOAL #4   Title Pt  pain will be <2/10 throughout day while participating in ADLs and work by 03/04/16.    Time 8   Period Weeks   Status New               Plan - 01/18/16 1056    Clinical Impression Statement Pt with decreased ROM with AA exercises; PROM is pretty good with limited mobility.  Pt continues to have pain limiting movement.     PT Next Visit Plan Gentle PROM, Manual therapy technique, Review HEP. Prescribe pain- free AAROM including table slides next visit.    PT Home Exercise Plan isometrics prescribed this visit   Consulted and Agree with Plan of Care Patient        Problem List Patient Active Problem List   Diagnosis Date Noted  . Fibrocystic breast 02/24/2014   Laureen Abrahams, PT, DPT 01/18/2016 10:59 AM  [facility] [address] [place], [place], [address] Phone: [phone]   Fax:  [phone]  Name: Abigail Moody MRN: [id number] Date of Birth: 1946/01/23

## 2016-01-21 ENCOUNTER — Ambulatory Visit: Payer: PRIVATE HEALTH INSURANCE

## 2016-01-21 DIAGNOSIS — R293 Abnormal posture: Secondary | ICD-10-CM

## 2016-01-21 DIAGNOSIS — M25511 Pain in right shoulder: Secondary | ICD-10-CM

## 2016-01-21 DIAGNOSIS — M75101 Unspecified rotator cuff tear or rupture of right shoulder, not specified as traumatic: Secondary | ICD-10-CM | POA: Diagnosis not present

## 2016-01-21 DIAGNOSIS — R531 Weakness: Secondary | ICD-10-CM

## 2016-01-21 NOTE — Patient Instructions (Signed)
   PRESS And HOLD 5 secs . 15 reps each   Strengthening: Isometric Flexion  Using wall for resistance, press right fist into ball using light pressure. Hold ____ seconds. Repeat ____ times per set. Do ____ sets per session. Do ____ sessions per day.  SHOULDER: Abduction (Isometric)  Use wall as resistance. Press arm against pillow. Keep elbow straight. Hold ___ seconds. ___ reps per set, ___ sets per day, ___ days per week  Extension (Isometric)  Place left bent elbow and back of arm against wall. Press elbow against wall. Hold ____ seconds. Repeat ____ times. Do ____ sessions per day.  Internal Rotation (Isometric)  Place palm of right fist against door frame, with elbow bent. Press fist against door frame. Hold ____ seconds. Repeat ____ times. Do ____ sessions per day.  External Rotation (Isometric)  Place back of left fist against door frame, with elbow bent. Press fist against door frame. Hold ____ seconds. Repeat ____ times. Do ____ sessions per day.  Copyright  VHI. All rights reserved.   .External Rotation (Assistive)    Seated with elbow bent at right angle and held against side, slide arm on table surface in an outward arc. Repeat _15___ times. Do __3__ sessions per day. Activity: Use this motion to push light object out of the way.  Copyright  VHI. All rights reserved.   Closed Chain: Shoulder Flexion - sitting- slide Flexion (Passive)    Sitting upright, slide forearm forward along table, bending from the waist until a stretch is felt. Hold _5___ seconds. Repeat __15__ times. Do __3__ sessions per day.  Copyright  VHI. All rights reserved.

## 2016-01-21 NOTE — Therapy (Signed)
[place], [place], [address] Phone: [phone]   Fax:  [phone]  Physical Therapy Treatment  Patient Details  Name: Abigail Moody MRN: [id number] Date of Birth: 1946/09/19 Referring Provider: [name]  Encounter Date: 01/21/2016      PT End of Session - 01/21/16 1146    Visit Number 5   Number of Visits 16   Date for PT Re-Evaluation 03/04/16   PT Start Time [id number]   PT Stop Time 1155   PT Time Calculation (min) 50 min   Activity Tolerance Patient tolerated treatment well;Patient limited by pain   Behavior During Therapy [facility] for tasks assessed/performed      Past Medical History  Diagnosis Date  . Hypertension   . Diffuse cystic mastopathy   . GERD (gastroesophageal reflux disease)   . Hemorrhoid     Past Surgical History  Procedure Laterality Date  . Carpal tunnel release Bilateral   . Ankle surgery Right 2006  . Mouth surgery  2010  . Toe surgery Left 2006  . Breast biopsy Bilateral   . Tonsillectomy  1959  . Colonoscopy  2008    Dr. Epifanio Lesches  . Trigger finger release Right 2013-2014?    There were no vitals filed for this visit.  Visit Diagnosis:  RCT (rotator cuff tear), right  Pain in joint of right shoulder  Poor posture  Weakness      Subjective Assessment - 01/21/16 1108    Subjective Pt suspects shoulder is getting better. Pt notes "more mobility with it" and is able to "do a little bit more. " No pain at rest. "Twinges" with certain movements.    Currently in Pain? Yes   Pain Score 0-No pain   Pain Location Shoulder   Pain Orientation Right   Pain Descriptors / Indicators Sore   Pain Type Acute pain   Pain Onset More than a month ago                         [facility] Adult PT Treatment/Exercise - 01/21/16 0001    Shoulder Exercises: Seated   Retraction 20 reps  Standing   External Rotation AAROM;10 reps;15 reps   Flexion AAROM;15 reps   Shoulder Exercises: Standing   Other Standing Exercises UE RANGER on floor 5 x  flexion standing with increased pain, so discontinued.    Other Standing Exercises physioball flexion on table, ER, Flex, ABD x 15 reps   Shoulder Exercises: Pulleys   Flexion 2 minutes   Flexion Limitations pain - free    ABduction Other (comment)   ABduction Limitations Pain with ABD, so disconitnued.    Shoulder Exercises: ROM/Strengthening   Pendulum 20x each    Shoulder Exercises: Isometric Strengthening   Flexion Limitations 15 x 5 "   Extension Limitations 15 x 5 "   External Rotation Limitations 15 x 5 "   Internal Rotation Limitations 15 x 5 "   ABduction Limitations 15 x 5 "   Cryotherapy   Number Minutes Cryotherapy 10 Minutes   Cryotherapy Location Shoulder   Type of Cryotherapy Ice pack   Manual Therapy   Passive ROM R SHOULDER PROM all directions, gentle joint ditraction.                 PT Education - 01/21/16 1124    Education provided Yes   Education Details Gave pt prescribed isometric HEP again. Added table slides flexion  and ER.    Person(s) Educated Patient   Methods Explanation;Demonstration   Comprehension Verbalized understanding;Need further instruction          PT Short Term Goals - 01/21/16 1245    PT SHORT TERM GOAL #1   Title Pt will be I with initial HEP for continued strengthening and mobility by 02/09/16.    Time 4   Period Weeks   Status On-going   PT SHORT TERM GOAL #2   Time 4   Period Weeks   Status On-going   PT SHORT TERM GOAL #3   Title Pt will be able to demo and verbalize proper posture as it related to upper body, shoulder, and cervical spine by 02/09/16.    Time 4   Period Weeks   Status On-going           PT Long Term Goals - 01/21/16 1245    PT LONG TERM GOAL #1   Title FOTO score will improve from 73% limited to <43% to demo improved function and mobility by 03/04/16.    Time 8   Period Weeks   Status On-going   PT LONG TERM  GOAL #2   Title Pt will be able to lift 2# overhead x 10 reps without pain in order to return to pain free functional activities for lifting overhead by 03/04/16.    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    Time 8   Period Weeks   Status On-going   PT LONG TERM GOAL #4   Title Pt pain will be <2/10 throughout day while participating in ADLs and work by 03/04/16.    Time 8   Period Weeks   Status On-going               Plan - 01/21/16 1114    Clinical Impression Statement No pain with isometrics today, but pt does not remember isometrics being prescribed for HEP. Reprinted isometric ther ex HEP and added table AAROM for HEP. Pt had increased pain with pulley in ABD, so discontinued.    PT Next Visit Plan Gentle PROM, Manual therapy technique, Review HEP. Prescribe pain-free AAROM.   PT Home Exercise Plan isometrics  and  table AAROM ther ex   Consulted and Agree with Plan of Care Patient        Problem List Patient Active Problem List   Diagnosis Date Noted  . Fibrocystic breast 02/24/2014    Dollene Cleveland, PT 01/21/2016, 12:47 PM  [facility] [address] [place], [place], [address] Phone: [phone]   Fax:  [phone]  Name: MOONYEEN LANGELL MRN: [id number] Date of Birth: [birth year]

## 2016-01-23 ENCOUNTER — Ambulatory Visit: Payer: PRIVATE HEALTH INSURANCE | Attending: Orthopedic Surgery | Admitting: Physical Therapy

## 2016-01-23 DIAGNOSIS — M75101 Unspecified rotator cuff tear or rupture of right shoulder, not specified as traumatic: Secondary | ICD-10-CM | POA: Insufficient documentation

## 2016-01-23 DIAGNOSIS — R293 Abnormal posture: Secondary | ICD-10-CM | POA: Insufficient documentation

## 2016-01-23 DIAGNOSIS — R531 Weakness: Secondary | ICD-10-CM | POA: Insufficient documentation

## 2016-01-23 DIAGNOSIS — M25511 Pain in right shoulder: Secondary | ICD-10-CM | POA: Diagnosis present

## 2016-01-23 NOTE — Therapy (Signed)
[place], [place], [address] Phone: [phone]   Fax:  [phone]  Physical Therapy Treatment  Patient Details  Name: BLAKENEY SUITT MRN: [id number] Date of Birth: 1946-11-15 Referring Provider: [name]  Encounter Date: 01/23/2016      PT End of Session - 01/23/16 1219    Visit Number 6   Number of Visits 16   Date for PT Re-Evaluation 03/04/16   PT Start Time [id number]   PT Stop Time 1229   PT Time Calculation (min) 44 min   Activity Tolerance Patient tolerated treatment well;Patient limited by pain   Behavior During Therapy [facility] for tasks assessed/performed      Past Medical History  Diagnosis Date  . Hypertension   . Diffuse cystic mastopathy   . GERD (gastroesophageal reflux disease)   . Hemorrhoid     Past Surgical History  Procedure Laterality Date  . Carpal tunnel release Bilateral   . Ankle surgery Right 2006  . Mouth surgery  2010  . Toe surgery Left 2006  . Breast biopsy Bilateral   . Tonsillectomy  1959  . Colonoscopy  2008    Dr. Epifanio Lesches  . Trigger finger release Right 2013-2014?    There were no vitals filed for this visit.  Visit Diagnosis:  RCT (rotator cuff tear), right  Pain in joint of right shoulder  Poor posture  Weakness      Subjective Assessment - 01/23/16 1144    Subjective R shoulder is sore.  Has been using ice more; feels like she may have pulled something last time.   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 Yes   Pain Score 2    Pain Location Shoulder   Pain Orientation Right   Pain Descriptors / Indicators Sore;Sharp   Pain Type Acute pain   Pain Onset More than a month ago   Pain Frequency Intermittent   Aggravating Factors  reaching; overhead activities   Pain Relieving Factors rest, ice, meds                         [facility] Adult PT Treatment/Exercise - 01/23/16 1148    Shoulder  Exercises: Supine   External Rotation Right;AAROM;10 reps   External Rotation Limitations with cane   Flexion AAROM;10 reps;Right   Flexion Limitations limited motion due to pain; cane   Other Supine Exercises chest press with cane x 10   Shoulder Exercises: Seated   Retraction 10 reps;Theraband   Theraband Level (Shoulder Retraction) Level 1 (Yellow)   Horizontal ABduction Right;15 reps;AAROM   Horizontal ABduction Limitations UE ranger   Flexion AAROM;15 reps   Flexion Limitations UE ranger   Other Seated Exercises scaption and flexion on physioball x 10   Other Seated Exercises chair push up x 10   Shoulder Exercises: Sidelying   External Rotation Right;10 reps   Flexion Right;10 reps  with foam roll   Shoulder Exercises: Standing   Flexion AAROM;Right;10 reps   Flexion Limitations wall ladder   Shoulder Exercises: Pulleys   Flexion 2 minutes   ABduction 1 minute  0-90   ABduction Limitations scaption   Cryotherapy   Number Minutes Cryotherapy 10 Minutes   Cryotherapy Location Shoulder   Type of Cryotherapy Ice pack                  PT Short Term Goals - 01/21/16 1245  PT SHORT TERM GOAL #1   Title Pt will be I with initial HEP for continued strengthening and mobility by 02/09/16.    Time 4   Period Weeks   Status On-going   PT SHORT TERM GOAL #2   Time 4   Period Weeks   Status On-going   PT SHORT TERM GOAL #3   Title Pt will be able to demo and verbalize proper posture as it related to upper body, shoulder, and cervical spine by 02/09/16.    Time 4   Period Weeks   Status On-going           PT Long Term Goals - 01/21/16 1245    PT LONG TERM GOAL #1   Title FOTO score will improve from 73% limited to <43% to demo improved function and mobility by 03/04/16.    Time 8   Period Weeks   Status On-going   PT LONG TERM GOAL #2   Title Pt will be able to lift 2# overhead x 10 reps without pain in order to return to pain free functional activities  for lifting overhead by 03/04/16.    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    Time 8   Period Weeks   Status On-going   PT LONG TERM GOAL #4   Title Pt pain will be <2/10 throughout day while participating in ADLs and work by 03/04/16.    Time 8   Period Weeks   Status On-going               Plan - 01/23/16 1220    Clinical Impression Statement Pt tolerated AA and gravity minimized exercises today well.  Still has episodes of sharp pain with activation of muscles in R shoulder (short duration).  Will continue to benefit from PT to maximize function.   PT Next Visit Plan  Gentle PROM, Manual therapy technique, Review HEP. Prescribe pain-free AAROM.   PT Home Exercise Plan isometrics  and  table AAROM ther ex        Problem List Patient Active Problem List   Diagnosis Date Noted  . Fibrocystic breast 02/24/2014   Laureen Abrahams, PT, DPT 01/23/2016 1:02 PM  [facility] [address] [place], [place], [address] Phone: [phone]   Fax:  [phone]  Name: Abigail Moody MRN: [id number] Date of Birth: 1946/07/05

## 2016-01-25 ENCOUNTER — Ambulatory Visit: Payer: PRIVATE HEALTH INSURANCE | Admitting: Physical Therapy

## 2016-01-25 DIAGNOSIS — M25511 Pain in right shoulder: Secondary | ICD-10-CM

## 2016-01-25 DIAGNOSIS — M75101 Unspecified rotator cuff tear or rupture of right shoulder, not specified as traumatic: Secondary | ICD-10-CM | POA: Diagnosis not present

## 2016-01-25 DIAGNOSIS — R531 Weakness: Secondary | ICD-10-CM

## 2016-01-25 DIAGNOSIS — R293 Abnormal posture: Secondary | ICD-10-CM

## 2016-01-25 NOTE — Therapy (Signed)
[place], [place], [address] Phone: [phone]   Fax:  [phone]  Physical Therapy Treatment  Patient Details  Name: Abigail Moody MRN: [id number] Date of Birth: Dec 07, 1946 Referring Provider: [name]  Encounter Date: 01/25/2016      PT End of Session - 01/25/16 1010    Visit Number 7   Number of Visits 16   Date for PT Re-Evaluation 03/04/16   PT Start Time 0930   PT Stop Time 1020   PT Time Calculation (min) 50 min   Activity Tolerance Patient tolerated treatment well   Behavior During Therapy [facility] for tasks assessed/performed      Past Medical History  Diagnosis Date  . Hypertension   . Diffuse cystic mastopathy   . GERD (gastroesophageal reflux disease)   . Hemorrhoid     Past Surgical History  Procedure Laterality Date  . Carpal tunnel release Bilateral   . Ankle surgery Right 2006  . Mouth surgery  2010  . Toe surgery Left 2006  . Breast biopsy Bilateral   . Tonsillectomy  1959  . Colonoscopy  2008    Dr. Epifanio Lesches  . Trigger finger release Right 2013-2014?    There were no vitals filed for this visit.  Visit Diagnosis:  RCT (rotator cuff tear), right  Pain in joint of right shoulder  Poor posture  Weakness      Subjective Assessment - 01/25/16 0933    Subjective Sore after Wednesday's session; still a little sore today.   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 Yes   Pain Score 2    Pain Location Shoulder   Pain Orientation Right   Pain Descriptors / Indicators Sharp;Sore   Pain Type Acute pain   Pain Onset More than a month ago   Pain Frequency Intermittent   Aggravating Factors  reaching, overhead activities   Pain Relieving Factors rest, ice, meds                         [facility] Adult PT Treatment/Exercise - 01/25/16 0935    Shoulder Exercises: Supine   Protraction Right;10 reps   Protraction  Limitations supported UE to decreased static flexion hold   Flexion AROM;Right   ABduction AROM;Right;10 reps   ABduction Limitations to tolerance (80-90 degrees)   Shoulder Exercises: Sidelying   External Rotation Right;10 reps   External Rotation Weight (lbs) 1   Flexion Right;10 reps;Weights;Limitations   Flexion Weight (lbs) 1   Flexion Limitations with foam roll   Shoulder Exercises: Standing   Other Standing Exercises UE ranger at #17: flexion and scaption x 15   Shoulder Exercises: Pulleys   Flexion 2 minutes   ABduction 2 minutes   ABduction Limitations scaption, mild increase in pain   Shoulder Exercises: Therapy Ball   Flexion 10 reps   Flexion Limitations seated with 5 sec hold   Other Therapy Ball Exercises standing er with ball on mat x 10   Shoulder Exercises: ROM/Strengthening   Rhythmic Stabilization, Supine 3x20 sec RUE   Cryotherapy   Number Minutes Cryotherapy 10 Minutes   Cryotherapy Location Shoulder   Type of Cryotherapy Ice pack                  PT Short Term Goals - 01/21/16 1245    PT SHORT TERM GOAL #1   Title Pt will be I with initial  HEP for continued strengthening and mobility by 02/09/16.    Time 4   Period Weeks   Status On-going   PT SHORT TERM GOAL #2   Time 4   Period Weeks   Status On-going   PT SHORT TERM GOAL #3   Title Pt will be able to demo and verbalize proper posture as it related to upper body, shoulder, and cervical spine by 02/09/16.    Time 4   Period Weeks   Status On-going           PT Long Term Goals - 01/21/16 1245    PT LONG TERM GOAL #1   Title FOTO score will improve from 73% limited to <43% to demo improved function and mobility by 03/04/16.    Time 8   Period Weeks   Status On-going   PT LONG TERM GOAL #2   Title Pt will be able to lift 2# overhead x 10 reps without pain in order to return to pain free functional activities for lifting overhead by 03/04/16.    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    Time 8   Period Weeks   Status On-going   PT LONG TERM GOAL #4   Title Pt pain will be <2/10 throughout day while participating in ADLs and work by 03/04/16.    Time 8   Period Weeks   Status On-going               Plan - 01/25/16 1010    Clinical Impression Statement Pt able to perform some active exercises against gravity, scaption and abduction continue to be most painful motions. Progressing well towards goals.   PT Next Visit Plan  Gentle PROM, Manual therapy technique, Review HEP. Prescribe pain-free AAROM.   PT Home Exercise Plan isometrics  and  table AAROM ther ex   Consulted and Agree with Plan of Care Patient        Problem List Patient Active Problem List   Diagnosis Date Noted  . Fibrocystic breast 02/24/2014   Laureen Abrahams, PT, DPT 01/25/2016 10:12 AM  [facility] [address] [place], [place], [address] Phone: [phone]   Fax:  [phone]  Name: Abigail Moody MRN: [id number] Date of Birth: [birth year]

## 2016-01-28 ENCOUNTER — Ambulatory Visit: Payer: PRIVATE HEALTH INSURANCE | Admitting: Physical Therapy

## 2016-01-28 DIAGNOSIS — R531 Weakness: Secondary | ICD-10-CM

## 2016-01-28 DIAGNOSIS — M25511 Pain in right shoulder: Secondary | ICD-10-CM

## 2016-01-28 DIAGNOSIS — R293 Abnormal posture: Secondary | ICD-10-CM

## 2016-01-28 DIAGNOSIS — M75101 Unspecified rotator cuff tear or rupture of right shoulder, not specified as traumatic: Secondary | ICD-10-CM

## 2016-01-28 NOTE — Therapy (Signed)
[place], [place], [address] Phone: [phone]   Fax:  [phone]  Physical Therapy Treatment  Patient Details  Name: Abigail Moody MRN: [id number] Date of Birth: September 23, 1946 Referring Provider: [name]  Encounter Date: 01/28/2016      PT End of Session - 01/28/16 1023    Visit Number 8   Number of Visits 16   Date for PT Re-Evaluation 03/04/16   PT Start Time 0935   PT Stop Time 1025   PT Time Calculation (min) 50 min   Activity Tolerance Patient tolerated treatment well   Behavior During Therapy [facility] for tasks assessed/performed      Past Medical History  Diagnosis Date  . Hypertension   . Diffuse cystic mastopathy   . GERD (gastroesophageal reflux disease)   . Hemorrhoid     Past Surgical History  Procedure Laterality Date  . Carpal tunnel release Bilateral   . Ankle surgery Right 2006  . Mouth surgery  2010  . Toe surgery Left 2006  . Breast biopsy Bilateral   . Tonsillectomy  1959  . Colonoscopy  2008    Dr. Epifanio Lesches  . Trigger finger release Right 2013-2014?    There were no vitals filed for this visit.  Visit Diagnosis:  RCT (rotator cuff tear), right  Pain in joint of right shoulder  Poor posture  Weakness      Subjective Assessment - 01/28/16 0938    Subjective No pain                         [facility] Adult PT Treatment/Exercise - 01/28/16 0945    Self-Care   Other Self-Care Comments  Anatomy, healing process,  ttears discussed,  tendon/inflamation discussed.    Shoulder Exercises: Seated   Other Seated Exercises rolling shoulders.    Shoulder Exercises: Standing   Other Standing Exercises ball press   rolling 3 directions.     Shoulder Exercises: ROM/Strengthening   Other ROM/Strengthening Exercises cane standing abduction, flexion ER/IR  Cues to try using arm a little VS PROM.  5 to 10 reps   Other ROM/Strengthening Exercises UE ranger   Painfree.     Shoulder Exercises: Isometric Strengthening   Flexion Limitations 10 X 5   External Rotation --  5 X 5 painful so stopped   Internal Rotation Limitations 10 X4   ADduction --  10 X10   Cryotherapy   Number Minutes Cryotherapy 10 Minutes   Cryotherapy Location Shoulder   Type of Cryotherapy --  cold pack   Manual Therapy   Manual therapy comments Soft tissue work [place], shoulder                   PT Short Term Goals - 01/28/16 1026    PT SHORT TERM GOAL #1   Title Pt will be I with initial HEP for continued strengthening and mobility by 02/09/16.    Baseline minor cues   Time 4   Period Weeks   Status On-going   PT SHORT TERM GOAL #2   Title R shoulder AROM will improve to 90 degrees pain-free for improved overhead reaching activities by 02/09/16   Time 4   Period Weeks   Status On-going   PT SHORT TERM GOAL #3   Title Pt will be able to demo and verbalize proper posture as it related to upper body, shoulder, and cervical spine by 02/09/16.  Baseline able to do in clinic with minor cues   Time 4   Period Weeks   Status On-going           PT Long Term Goals - 01/21/16 1245    PT LONG TERM GOAL #1   Title FOTO score will improve from 73% limited to <43% to demo improved function and mobility by 03/04/16.    Time 8   Period Weeks   Status On-going   PT LONG TERM GOAL #2   Title Pt will be able to lift 2# overhead x 10 reps without pain in order to return to pain free functional activities for lifting overhead by 03/04/16.    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    Time 8   Period Weeks   Status On-going   PT LONG TERM GOAL #4   Title Pt pain will be <2/10 throughout day while participating in ADLs and work by 03/04/16.    Time 8   Period Weeks   Status On-going                Plan - 01/28/16 1024    Clinical Impression Statement Brief pain intermittantly  during exercises.  Sore post exercise VS painful at rest.     PT Next Visit Plan AA Cane standing for home?  MD note measure and check goals.     PT Home Exercise Plan continue   Consulted and Agree with Plan of Care Patient        Problem List Patient Active Problem List   Diagnosis Date Noted  . Fibrocystic breast 02/24/2014    [facility] 01/28/2016, 10:44 AM  [facility] [address] [place], [place], [address] Phone: [phone]   Fax:  [phone]  Name: Abigail Moody MRN: [id number] Date of Birth: 20-Dec-1946    Melvenia Needles, PTA 01/28/2016 10:44 AM Phone: [phone] Fax: [phone]

## 2016-01-30 ENCOUNTER — Ambulatory Visit: Payer: PRIVATE HEALTH INSURANCE | Admitting: Physical Therapy

## 2016-01-30 DIAGNOSIS — R293 Abnormal posture: Secondary | ICD-10-CM

## 2016-01-30 DIAGNOSIS — M25511 Pain in right shoulder: Secondary | ICD-10-CM

## 2016-01-30 DIAGNOSIS — M75101 Unspecified rotator cuff tear or rupture of right shoulder, not specified as traumatic: Secondary | ICD-10-CM

## 2016-01-30 DIAGNOSIS — R531 Weakness: Secondary | ICD-10-CM

## 2016-01-30 NOTE — Patient Instructions (Signed)
     Resistive Band Rowing    With resistive band anchored in door, grasp both ends. Keeping elbows bent, pull back, squeezing shoulder blades together. Hold _0___ seconds. Repeat __10__ times. Do __1__ sessions per day.    Yellow band.  Emphasis on pain free exercise.   http://gt2.exer.us/98   Copyright  VHI. All rights reserved.   Also issued from exercise drawer:  Bilateral External rotation, 10 X.   0 seconds holding.  Daily .Marland Kitchen Emphasis on pain free exercise.  Yellow band.

## 2016-01-30 NOTE — Therapy (Signed)
[place], [place], [address] Phone: [phone]   Fax:  [phone]  Physical Therapy Treatment  Patient Details  Name: Abigail Moody MRN: [id number] Date of Birth: Mar 23, 1946 Referring Provider: [name]  Encounter Date: 01/30/2016      PT End of Session - 01/30/16 1300    Visit Number 9   Number of Visits 16   Date for PT Re-Evaluation 03/04/16   PT Start Time 0935   PT Stop Time 1025   PT Time Calculation (min) 50 min   Activity Tolerance Patient tolerated treatment well   Behavior During Therapy [facility] for tasks assessed/performed      Past Medical History  Diagnosis Date  . Hypertension   . Diffuse cystic mastopathy   . GERD (gastroesophageal reflux disease)   . Hemorrhoid     Past Surgical History  Procedure Laterality Date  . Carpal tunnel release Bilateral   . Ankle surgery Right 2006  . Mouth surgery  2010  . Toe surgery Left 2006  . Breast biopsy Bilateral   . Tonsillectomy  1959  . Colonoscopy  2008    Dr. Epifanio Lesches  . Trigger finger release Right 2013-2014?    There were no vitals filed for this visit.  Visit Diagnosis:  RCT (rotator cuff tear), right  Pain in joint of right shoulder  Poor posture  Weakness      Subjective Assessment - 01/30/16 1229    Subjective Pain at rest 1/10,  pain with movement 3/10   Currently in Pain? Yes   Pain Score --  1 to 3/10    Pain Location Shoulder   Pain Orientation Right   Pain Descriptors / Indicators Sore;Sharp   Pain Radiating Towards deltoid intermittantly   Pain Frequency Intermittent   Aggravating Factors  reaching.    Pain Relieving Factors rest , ice, medication            [facility] PT Assessment - 01/30/16 0001    PROM   Right Shoulder Flexion 155 Degrees   Right Shoulder ABduction 60 Degrees   Right Shoulder Internal Rotation --  Reaches TI   Right Shoulder External Rotation 54 Degrees  humerus neutral                     [facility] Adult PT Treatment/Exercise - 01/30/16 0945    Shoulder Exercises: Supine   Other Supine Exercises cane chest press 5X  with 3/10 pain   Other Supine Exercises Cane flexion 5 X with 3/10 pain,     Shoulder Exercises: Seated   Row Strengthening;Both;10 reps;Theraband   Theraband Level (Shoulder Row) Level 1 (Yellow)   External Rotation Strengthening;Right;10 reps;Theraband   Theraband Level (Shoulder External Rotation) Level 1 (Yellow)   Flexion AAROM;10 reps   Abduction AAROM;10 reps   Shoulder Exercises: Standing   Retraction Limitations Scapular retraction 10X 5 second hold   Other Standing Exercises flexion cane tried , painful so stopped 2 reps with modifications.    Shoulder Exercises: Isometric Strengthening   Flexion Limitations 10 X5   Extension Limitations 10 X 5   Internal Rotation Limitations 10 X 5   ABduction Limitations 10 X 5   Cryotherapy   Number Minutes Cryotherapy 10 Minutes   Cryotherapy Location Shoulder   Type of Cryotherapy --  cold pack                PT Education - 01/30/16 1259    Education  provided Yes   Education Details rows and ER, yellow bands   Person(s) Educated Patient   Methods Explanation;Demonstration;Verbal cues;Handout   Comprehension Verbalized understanding;Returned demonstration          PT Short Term Goals - 01/30/16 9357    PT SHORT TERM GOAL #1   Title Pt will be I with initial HEP for continued strengthening and mobility by 02/09/16.    Baseline understands   Time 4   Period Weeks   Status Achieved   PT SHORT TERM GOAL #2   Title R shoulder AROM will improve to 90 degrees pain-free for improved overhead reaching activities by 02/09/16   Baseline 155  3/10 pain      Time 4   Period Weeks   Status Partially Met   PT SHORT TERM GOAL #3   Title Pt will be able to demo and verbalize proper posture as it related to upper body, shoulder, and cervical spine by 02/09/16.    Baseline  able to verbalize and demonstrate   Time 4   Period Weeks   Status Achieved           PT Long Term Goals - 01/21/16 1245    PT LONG TERM GOAL #1   Title FOTO score will improve from 73% limited to <43% to demo improved function and mobility by 03/04/16.    Time 8   Period Weeks   Status On-going   PT LONG TERM GOAL #2   Title Pt will be able to lift 2# overhead x 10 reps without pain in order to return to pain free functional activities for lifting overhead by 03/04/16.    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    Time 8   Period Weeks   Status On-going   PT LONG TERM GOAL #4   Title Pt pain will be <2/10 throughout day while participating in ADLs and work by 03/04/16.    Time 8   Period Weeks   Status On-going               Plan - 01/30/16 1301    Clinical Impression Statement ROM improving,  Isometric exercises mostly comfortable.  Cane exercises 3/10 pain.   She is independent with initial home exercises(STG#1) Pain with reaching 3/10, She understands and demonstrates good posture and how it relates to her shoulder.     PT Next Visit Plan See how MD visit went.  review row and ER bands.  Progress with AAROM  UE Ranger vs cane. FOTO next visit.   PT Home Exercise Plan ER and rows with yellow bands   Consulted and Agree with Plan of Care Patient        Problem List Patient Active Problem List   Diagnosis Date Noted  . Fibrocystic breast 02/24/2014    [facility] 01/30/2016, 1:10 PM  [place], [place], [address] Phone: [phone]   Fax:  [phone]  Name: Abigail Moody MRN: [id number] Date of Birth: 1946/05/26    Melvenia Needles, PTA 01/30/2016 1:10 PM Phone: [phone] Fax: [phone]

## 2016-02-01 ENCOUNTER — Ambulatory Visit: Payer: PRIVATE HEALTH INSURANCE | Admitting: Physical Therapy

## 2016-02-01 DIAGNOSIS — R531 Weakness: Secondary | ICD-10-CM

## 2016-02-01 DIAGNOSIS — R293 Abnormal posture: Secondary | ICD-10-CM

## 2016-02-01 DIAGNOSIS — M75101 Unspecified rotator cuff tear or rupture of right shoulder, not specified as traumatic: Secondary | ICD-10-CM

## 2016-02-01 DIAGNOSIS — M25511 Pain in right shoulder: Secondary | ICD-10-CM

## 2016-02-01 NOTE — Therapy (Signed)
[place], [place], [address] Phone: [phone]   Fax:  [phone]  Physical Therapy Treatment  Patient Details  Name: Abigail Moody MRN: [id number] Date of Birth: 23-Aug-1946 Referring Provider: [name]  Encounter Date: 02/01/2016      PT End of Session - 02/01/16 0951    Visit Number 10   Number of Visits 16   Date for PT Re-Evaluation 03/04/16   PT Start Time 0931   PT Stop Time 1030   PT Time Calculation (min) 59 min      Past Medical History  Diagnosis Date  . Hypertension   . Diffuse cystic mastopathy   . GERD (gastroesophageal reflux disease)   . Hemorrhoid     Past Surgical History  Procedure Laterality Date  . Carpal tunnel release Bilateral   . Ankle surgery Right 2006  . Mouth surgery  2010  . Toe surgery Left 2006  . Breast biopsy Bilateral   . Tonsillectomy  1959  . Colonoscopy  2008    Dr. Epifanio Lesches  . Trigger finger release Right 2013-2014?    There were no vitals filed for this visit.  Visit Diagnosis:  RCT (rotator cuff tear), right  Pain in joint of right shoulder  Poor posture  Weakness      Subjective Assessment - 02/01/16 1001    Subjective I saw MD yesterday and we have decided to do RTC surgery and debridement. Waiting to schedule. MD recommended to continue PT until surgery.    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 Yes   Pain Score 2   or 3   Pain Location Shoulder   Pain Orientation Right   Pain Descriptors / Indicators Sore;Sharp            [facility] PT Assessment - 02/01/16 0001    Observation/Other Assessments   Focus on Therapeutic Outcomes (FOTO)  57% limitation improved from 73% limited   AROM   Right Shoulder Flexion 90 Degrees   PROM   Right Shoulder Flexion 140 Degrees   Right Shoulder ABduction 90 Degrees   Right Shoulder Internal Rotation 70 Degrees   Right Shoulder External Rotation 60 Degrees   Strength   Right Shoulder Flexion 2-/5   Right Shoulder ABduction 2-/5   Right Shoulder Internal Rotation 4+/5   Right Shoulder External Rotation 4-/5                     [facility] Adult PT Treatment/Exercise - 02/01/16 0001    Shoulder Exercises: Supine   Other Supine Exercises supine cane all exercies x 10 -discomfort not pain   Shoulder Exercises: Seated   Flexion AAROM;10 reps   Flexion Limitations UE ranger seated and standing numerous reps   Shoulder Exercises: ROM/Strengthening   Other ROM/Strengthening Exercises UE ranger  Painfree.     Cryotherapy   Number Minutes Cryotherapy 10 Minutes   Cryotherapy Location Shoulder   Type of Cryotherapy Ice pack   Manual Therapy   Passive ROM all planes to tolerance                  PT Short Term Goals - 01/30/16 5110    PT SHORT TERM GOAL #1   Title Pt will be I with initial HEP for continued strengthening and mobility by 02/09/16.    Baseline understands   Time 4  Period Weeks   Status Achieved   PT SHORT TERM GOAL #2   Title R shoulder AROM will improve to 90 degrees pain-free for improved overhead reaching activities by 02/09/16   Baseline 155  3/10 pain      Time 4   Period Weeks   Status Partially Met   PT SHORT TERM GOAL #3   Title Pt will be able to demo and verbalize proper posture as it related to upper body, shoulder, and cervical spine by 02/09/16.    Baseline able to verbalize and demonstrate   Time 4   Period Weeks   Status Achieved           PT Long Term Goals - 01/21/16 1245    PT LONG TERM GOAL #1   Title FOTO score will improve from 73% limited to <43% to demo improved function and mobility by 03/04/16.    Time 8   Period Weeks   Status On-going   PT LONG TERM GOAL #2   Title Pt will be able to lift 2# overhead x 10 reps without pain in order to return to pain free functional activities for lifting overhead by 03/04/16.    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    Time 8   Period Weeks   Status On-going   PT LONG TERM GOAL #4   Title Pt pain will be <2/10 throughout day while participating in ADLs and work by 03/04/16.    Time 8   Period Weeks   Status On-going               Plan - 02/01/16 1003    Clinical Impression Statement RTC surgery to be scheduled. Will continue PT for now. Performed PROM, AAROM to tolerance with pt reporting discomfort but mild increase in pain.    PT Next Visit Plan Has pt scheduled surgery?  review row and ER bands.  Progress with AAROM  UE Ranger vs cane.         Problem List Patient Active Problem List   Diagnosis Date Noted  . Fibrocystic breast 02/24/2014    Dorene Ar, PTA 02/01/2016, 10:39 AM  [facility] [address] [place], [place], [address] Phone: [phone]   Fax:  [phone]  Name: Abigail Moody MRN: [id number] Date of Birth: Feb 05, 1946

## 2016-02-04 ENCOUNTER — Ambulatory Visit: Payer: PRIVATE HEALTH INSURANCE

## 2016-02-04 DIAGNOSIS — M25511 Pain in right shoulder: Secondary | ICD-10-CM

## 2016-02-04 DIAGNOSIS — R293 Abnormal posture: Secondary | ICD-10-CM

## 2016-02-04 DIAGNOSIS — M75101 Unspecified rotator cuff tear or rupture of right shoulder, not specified as traumatic: Secondary | ICD-10-CM | POA: Diagnosis not present

## 2016-02-04 DIAGNOSIS — R531 Weakness: Secondary | ICD-10-CM

## 2016-02-04 NOTE — Therapy (Signed)
[place], [place], [address] Phone: [phone]   Fax:  [phone]  Physical Therapy Treatment  Patient Details  Name: ARELIS NEUMEIER MRN: [id number] Date of Birth: 03-23-1946 Referring Provider: [name]  Encounter Date: 02/04/2016      PT End of Session - 02/04/16 1013    Visit Number 11   Number of Visits 16   Date for PT Re-Evaluation 03/04/16   PT Start Time 0932   PT Stop Time 1020   PT Time Calculation (min) 48 min   Activity Tolerance Patient tolerated treatment well   Behavior During Therapy [facility] for tasks assessed/performed      Past Medical History  Diagnosis Date  . Hypertension   . Diffuse cystic mastopathy   . GERD (gastroesophageal reflux disease)   . Hemorrhoid     Past Surgical History  Procedure Laterality Date  . Carpal tunnel release Bilateral   . Ankle surgery Right 2006  . Mouth surgery  2010  . Toe surgery Left 2006  . Breast biopsy Bilateral   . Tonsillectomy  1959  . Colonoscopy  2008    Dr. Epifanio Lesches  . Trigger finger release Right 2013-2014?    There were no vitals filed for this visit.  Visit Diagnosis:  RCT (rotator cuff tear), right  Pain in joint of right shoulder  Poor posture  Weakness      Subjective Assessment - 02/04/16 0938    Subjective Pain is 4/10 . Still waiting to hear about surgery. Pt suspects her cortisone shot is "wearing off" since she is hurting more.    Currently in Pain? Yes   Pain Score 4    Pain Location Shoulder   Pain Orientation Right   Pain Descriptors / Indicators Aching;Sharp   Pain Type Acute pain                         [facility] Adult PT Treatment/Exercise - 02/04/16 0001    Shoulder Exercises: Standing   Other Standing Exercises UE ranger from floor.    Other Standing Exercises physioball flexion on table  10 x 2    Shoulder Exercises: Pulleys   Flexion 1 minute   Flexion Limitations  increased pain, discontinued   Modalities   Modalities Electrical Stimulation   Cryotherapy   Number Minutes Cryotherapy 15 Minutes  with E stim    Cryotherapy Location Shoulder   Type of Cryotherapy Ice pack   Electrical Stimulation   Electrical Stimulation Location Shoulder   Electrical Stimulation Action IFC   Electrical Stimulation Parameters To tolerance -7 Hz   Electrical Stimulation Goals Pain   Manual Therapy   Soft tissue mobilization STM to biceps, deltoid,  and anterior shoulder with decreased pain form 5/10 to 4/10 .    Passive ROM all planes to tolerance                PT Education - 02/04/16 1013    Education provided No          PT Short Term Goals - 02/04/16 1018    PT SHORT TERM GOAL #1   Title Pt will be I with initial HEP for continued strengthening and mobility by 02/09/16.    Baseline understands   Time 4   Period Weeks   Status Achieved   PT SHORT TERM GOAL #2   Title R shoulder AROM will improve to 90 degrees pain-free for improved overhead reaching  activities by 02/09/16   Baseline 155  3/10 pain      Time 4   Period Weeks   Status Partially Met   PT SHORT TERM GOAL #3   Title Pt will be able to demo and verbalize proper posture as it related to upper body, shoulder, and cervical spine by 02/09/16.    Baseline able to verbalize and demonstrate   Time 4   Period Weeks   Status Achieved           PT Long Term Goals - 02/04/16 1019    PT LONG TERM GOAL #1   Title FOTO score will improve from 73% limited to <43% to demo improved function and mobility by 03/04/16.    Baseline 02/01/16: 57% limited   Time 8   Period Weeks   Status On-going   PT LONG TERM GOAL #2   Title Pt will be able to lift 2# overhead x 10 reps without pain in order to return to pain free functional activities for lifting overhead by 03/04/16.    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    Time 8   Period Weeks   Status On-going   PT LONG TERM GOAL #4   Title Pt pain will be <2/10 throughout day while participating in ADLs and work by 03/04/16.    Time 8   Period Weeks   Status On-going               Plan - 02/04/16 1015    Clinical Impression Statement Pt with increased pain today with AAROM ther ex. Incresed from 4/10 to 5/10 after ther ex. DIscontinued ther ex and ended with MT (STM) with PROM and IFC with ice and pain decreased from 5/10 to 3/10 after tx.  Per pt report, MD ordered pt to continue PT until surgery in order to maintain ROM.    PT Next Visit Plan Has pt scheduled surgery?  review row. Progress with AAROM to tolerance.  UE Ranger vs cane.   Consulted and Agree with Plan of Care Patient        Problem List Patient Active Problem List   Diagnosis Date Noted  . Fibrocystic breast 02/24/2014    Dollene Cleveland, PT 02/04/2016, 10:31 AM  [facility] [address] [place], [place], [address] Phone: [phone]   Fax:  [phone]  Name: SHANTELLA BLUBAUGH MRN: [id number] Date of Birth: 1946-10-03

## 2016-02-06 ENCOUNTER — Encounter: Payer: Worker's Compensation | Admitting: Physical Therapy

## 2016-02-08 ENCOUNTER — Ambulatory Visit: Payer: PRIVATE HEALTH INSURANCE | Admitting: Physical Therapy

## 2016-02-08 DIAGNOSIS — M25511 Pain in right shoulder: Secondary | ICD-10-CM

## 2016-02-08 DIAGNOSIS — M75101 Unspecified rotator cuff tear or rupture of right shoulder, not specified as traumatic: Secondary | ICD-10-CM | POA: Diagnosis not present

## 2016-02-08 DIAGNOSIS — R293 Abnormal posture: Secondary | ICD-10-CM

## 2016-02-08 DIAGNOSIS — R531 Weakness: Secondary | ICD-10-CM

## 2016-02-08 NOTE — Therapy (Addendum)
[place], [place], [address] Phone: [phone]   Fax:  [phone]  Physical Therapy Treatment  and Discharge Summary  Patient Details  Name: Abigail Moody MRN: [id number] Date of Birth: 11-Mar-1946 Referring Provider: [name]  Encounter Date: 02/08/2016      PT End of Session - 02/08/16 1021    Visit Number 12   Number of Visits 16   Date for PT Re-Evaluation 03/04/16   PT Start Time 2778      Past Medical History  Diagnosis Date  . Hypertension   . Diffuse cystic mastopathy   . GERD (gastroesophageal reflux disease)   . Hemorrhoid     Past Surgical History  Procedure Laterality Date  . Carpal tunnel release Bilateral   . Ankle surgery Right 2006  . Mouth surgery  2010  . Toe surgery Left 2006  . Breast biopsy Bilateral   . Tonsillectomy  1959  . Colonoscopy  2008    Dr. Epifanio Lesches  . Trigger finger release Right 2013-2014?    There were no vitals filed for this visit.  Visit Diagnosis:  No diagnosis found.      Subjective Assessment - 02/08/16 1017    Subjective They have not scheduled surgery yet.    Currently in Pain? Yes   Pain Score 4    Pain Location Shoulder   Pain Orientation Right   Aggravating Factors  reaching   Pain Relieving Factors rest ice meds            [facility] PT Assessment - 02/08/16 0001    PROM   Right Shoulder Flexion 140 Degrees   Right Shoulder ABduction 90 Degrees   Right Shoulder Internal Rotation 75 Degrees   Right Shoulder External Rotation 65 Degrees                     [facility] Adult PT Treatment/Exercise - 02/08/16 0001    Shoulder Exercises: Supine   Other Supine Exercises supine cane all exercies x 10 -discomfort not pain   Shoulder Exercises: Standing   External Rotation Right;20 reps;Theraband   Theraband Level (Shoulder External Rotation) Level 1 (Yellow)   Internal Rotation Right;20 reps;Theraband   Theraband Level  (Shoulder Internal Rotation) Level 1 (Yellow)   Extension Both;10 reps;20 reps;Theraband   Theraband Level (Shoulder Extension) Level 1 (Yellow)   Row 20 reps;Theraband   Theraband Level (Shoulder Row) Level 1 (Yellow)   Shoulder Exercises: Pulleys   Flexion 1 minute   Shoulder Exercises: ROM/Strengthening   Other ROM/Strengthening Exercises --  wall ladder for flexion x 10   Other ROM/Strengthening Exercises UE ranger  Painfree.     Shoulder Exercises: Isometric Strengthening   Flexion Limitations 10 X5   ADduction 5X10"   Cryotherapy   Number Minutes Cryotherapy 15 Minutes   Cryotherapy Location Shoulder   Type of Cryotherapy Other (comment)  vaso no pressure   Electrical Stimulation   Electrical Stimulation Location Shoulder   Electrical Stimulation Action IFC x 15 min   Electrical Stimulation Parameters to tolerance   Electrical Stimulation Goals Pain   Manual Therapy   Passive ROM all planes to tolerance                  PT Short Term Goals - 02/04/16 1018    PT SHORT TERM GOAL #1   Title Pt will be I with initial HEP for continued strengthening and mobility by 02/09/16.    Baseline  understands   Time 4   Period Weeks   Status Achieved   PT SHORT TERM GOAL #2   Title R shoulder AROM will improve to 90 degrees pain-free for improved overhead reaching activities by 02/09/16   Baseline 155  3/10 pain      Time 4   Period Weeks   Status Partially Met   PT SHORT TERM GOAL #3   Title Pt will be able to demo and verbalize proper posture as it related to upper body, shoulder, and cervical spine by 02/09/16.    Baseline able to verbalize and demonstrate   Time 4   Period Weeks   Status Achieved           PT Long Term Goals - 02/04/16 1019    PT LONG TERM GOAL #1   Title FOTO score will improve from 73% limited to <43% to demo improved function and mobility by 03/04/16.    Baseline 02/01/16: 57% limited   Time 8   Period Weeks   Status On-going   PT LONG  TERM GOAL #2   Title Pt will be able to lift 2# overhead x 10 reps without pain in order to return to pain free functional activities for lifting overhead by 03/04/16.    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    Time 8   Period Weeks   Status On-going   PT LONG TERM GOAL #4   Title Pt pain will be <2/10 throughout day while participating in ADLs and work by 03/04/16.    Time 8   Period Weeks   Status On-going               Plan - 02/08/16 1102    Clinical Impression Statement Pain increased from 4/10 to 5/10 with AAROM so repeated IFC and used vaso for pain post session. Increased pain with abduction ROM.    PT Next Visit Plan Has pt scheduled surgery?  review row. Progress with AAROM to tolerance.  UE Ranger vs cane.        Problem List Patient Active Problem List   Diagnosis Date Noted  . Fibrocystic breast 02/24/2014    Dorene Ar, PTA 02/08/2016, 12:15 PM   Addendum created 02/27/2016 By Dollene Cleveland, PT, DPT 02/27/2016 8:16 AM Phone: [phone] Fax: [phone]  PHYSICAL THERAPY DISCHARGE SUMMARY  Visits from Start of Care: 12  Current functional level related to goals / functional outcomes: Difficulty with dressing, bathing, grooming, reaching, lifting carrying. STGs #1 & #3 achieved. All others not met.     Remaining deficits: Pain, ROM, strength. Pt is planned to have surgery for RCT repair.  Date unknown.   Education / Equipment: HEP provided   Plan: Patient agrees to discharge.  Patient goals were partially met. Patient is being discharged due to not returning since the last visit.  ?????       [place], PT, DPT 02/27/2016 8:24 AM Phone: [phone] Fax: [phone]  Outpatient Rehabilitation [facility] [address] [place], [place],  [address] Phone: [phone]   Fax:  [phone]  Name: Abigail Moody MRN: [id number] Date of Birth: 04/06/1946

## 2016-02-12 DIAGNOSIS — F3342 Major depressive disorder, recurrent, in full remission: Secondary | ICD-10-CM | POA: Diagnosis not present

## 2016-02-12 DIAGNOSIS — K219 Gastro-esophageal reflux disease without esophagitis: Secondary | ICD-10-CM | POA: Diagnosis not present

## 2016-02-12 DIAGNOSIS — I10 Essential (primary) hypertension: Secondary | ICD-10-CM | POA: Diagnosis not present

## 2016-02-12 DIAGNOSIS — J4520 Mild intermittent asthma, uncomplicated: Secondary | ICD-10-CM | POA: Diagnosis not present

## 2016-02-12 DIAGNOSIS — E784 Other hyperlipidemia: Secondary | ICD-10-CM | POA: Diagnosis not present

## 2016-02-12 DIAGNOSIS — M25511 Pain in right shoulder: Secondary | ICD-10-CM | POA: Diagnosis not present

## 2016-02-14 ENCOUNTER — Ambulatory Visit
Admission: RE | Admit: 2016-02-14 | Discharge: 2016-02-14 | Disposition: A | Discharge status: Home / Self Care | Payer: PPO | Source: Ambulatory Visit | Attending: General Surgery | Admitting: General Surgery

## 2016-02-14 DIAGNOSIS — Z1231 Encounter for screening mammogram for malignant neoplasm of breast: Secondary | ICD-10-CM | POA: Diagnosis not present

## 2016-02-18 ENCOUNTER — Encounter: Payer: Self-pay | Admitting: General Surgery

## 2016-02-18 ENCOUNTER — Ambulatory Visit (INDEPENDENT_AMBULATORY_CARE_PROVIDER_SITE_OTHER): Payer: PPO | Admitting: General Surgery

## 2016-02-18 VITALS — BP 142/90 | HR 76 | Resp 16 | Ht 64.0 in | Wt 220.0 lb

## 2016-02-18 DIAGNOSIS — R3129 Other microscopic hematuria: Secondary | ICD-10-CM | POA: Diagnosis not present

## 2016-02-18 DIAGNOSIS — I10 Essential (primary) hypertension: Secondary | ICD-10-CM | POA: Diagnosis not present

## 2016-02-18 DIAGNOSIS — Z87898 Personal history of other specified conditions: Secondary | ICD-10-CM

## 2016-02-18 DIAGNOSIS — E559 Vitamin D deficiency, unspecified: Secondary | ICD-10-CM | POA: Diagnosis not present

## 2016-02-18 DIAGNOSIS — Z86018 Personal history of other benign neoplasm: Secondary | ICD-10-CM

## 2016-02-18 NOTE — Progress Notes (Signed)
Patient ID: Abigail Moody, female   DOB: 1946/09/01, 70 y.o.   MRN: [id number]  Chief Complaint  Patient presents with  . Follow-up    Mammogram    HPI Abigail Moody is a 70 y.o. female. who presents for a breast cancer follow up The most recent mammogram was done on .  Patient does perform regular self breast checks and gets regular mammograms done.  She has had a right shoulder injussry from a fall few months ago and is due to have surgery on rotator cuff. I have reviewed the history of present illness with the patient.   HPI  Past Medical History  Diagnosis Date  . Hypertension   . Diffuse cystic mastopathy   . GERD (gastroesophageal reflux disease)   . Hemorrhoid     Past Surgical History  Procedure Laterality Date  . Carpal tunnel release Bilateral   . Ankle surgery Right 2006  . Mouth surgery  2010  . Toe surgery Left 2006  . Breast biopsy Bilateral   . Tonsillectomy  1959  . Colonoscopy  2008    Dr. Epifanio Lesches  . Trigger finger release Right 2013-2014?    Family History  Problem Relation Age of Onset  . Cancer Father     prostate  . Cancer Brother     half brother/lung  . Breast cancer Other     Social History Social History  Substance Use Topics  . Smoking status: Never Smoker   . Smokeless tobacco: Never Used  . Alcohol Use: Yes    Allergies  Allergen Reactions  . Erythromycin Itching  . Levaquin [Levofloxacin] Other (See Comments)    Lethargic, no energy, cannot function for daily activities per patient.     Current Outpatient Prescriptions  Medication Sig Dispense Refill  . amLODipine (NORVASC) 5 MG tablet Take 5 mg by mouth daily.    Marland Kitchen aspirin 325 MG tablet Take 325 mg by mouth daily.    Marland Kitchen atorvastatin (LIPITOR) 10 MG tablet Take 10 mg by mouth daily.    . Biotin 10 MG TABS Take by mouth.    . calcium-vitamin D 250-100 MG-UNIT per tablet Take 1 tablet by mouth 2 (two) times daily.    Marland Kitchen escitalopram (LEXAPRO) 10 MG tablet Take 10 mg  by mouth daily.    . ferrous sulfate 325 (65 FE) MG tablet Take 325 mg by mouth daily with breakfast.    . fluticasone (FLONASE) 50 MCG/ACT nasal spray     . hydrochlorothiazide (HYDRODIURIL) 25 MG tablet Take 25 mg by mouth daily.    . meloxicam (MOBIC) 15 MG tablet Take 15 mg by mouth daily.    . Multiple Vitamins-Minerals (MULTIVITAMIN WITH MINERALS) tablet Take 1 tablet by mouth daily.    Marland Kitchen omeprazole (PRILOSEC) 20 MG capsule Take 20 mg by mouth daily.    Marland Kitchen PROAIR HFA 108 (90 BASE) MCG/ACT inhaler      No current facility-administered medications for this visit.    Review of Systems Review of Systems  Constitutional: Negative.   Respiratory: Negative.   Cardiovascular: Negative.     Blood pressure 142/90, pulse 76, resp. rate 16, height 5\' 4"  (1.626 m), weight 220 lb (99.791 kg).  Physical Exam Physical Exam  Constitutional: She is oriented to person, place, and time. She appears well-developed and well-nourished.  Eyes: Conjunctivae are normal. No scleral icterus.  Neck: Neck supple.  Cardiovascular: Normal rate and normal heart sounds.   Pulmonary/Chest: Effort normal and breath sounds normal.  Right breast exhibits no inverted nipple, no mass, no nipple discharge, no skin change and no tenderness. Left breast exhibits no inverted nipple, no mass, no nipple discharge, no skin change and no tenderness.  Abdominal: Soft. Bowel sounds are normal. There is no hepatomegaly. There is no tenderness.  Lymphadenopathy:    She has no cervical adenopathy.    She has no axillary adenopathy.  Neurological: She is alert and oriented to person, place, and time.  Skin: Skin is warm.    Data Reviewed Mammogram reviewed-stable  Assessment    Stable exam. History of breast masses    Plan  Follow up in 1 year b/l screening mammogram.     PCP:  Adin Hector This information has been scribed by Gaspar Cola CMA.     Randell Detter G 02/19/2016, 11:09 AM

## 2016-02-18 NOTE — Patient Instructions (Signed)
Follow up in 1 year b/l diagnostic mammogram.

## 2016-02-19 ENCOUNTER — Encounter: Payer: Self-pay | Admitting: General Surgery

## 2016-02-25 DIAGNOSIS — G4710 Hypersomnia, unspecified: Secondary | ICD-10-CM | POA: Diagnosis not present

## 2016-02-25 DIAGNOSIS — J4520 Mild intermittent asthma, uncomplicated: Secondary | ICD-10-CM | POA: Diagnosis not present

## 2016-02-25 DIAGNOSIS — K219 Gastro-esophageal reflux disease without esophagitis: Secondary | ICD-10-CM | POA: Diagnosis not present

## 2016-02-25 DIAGNOSIS — I10 Essential (primary) hypertension: Secondary | ICD-10-CM | POA: Diagnosis not present

## 2016-02-25 DIAGNOSIS — M1990 Unspecified osteoarthritis, unspecified site: Secondary | ICD-10-CM | POA: Diagnosis not present

## 2016-02-25 DIAGNOSIS — G4730 Sleep apnea, unspecified: Secondary | ICD-10-CM | POA: Diagnosis not present

## 2016-02-25 DIAGNOSIS — E784 Other hyperlipidemia: Secondary | ICD-10-CM | POA: Diagnosis not present

## 2016-02-25 DIAGNOSIS — R3129 Other microscopic hematuria: Secondary | ICD-10-CM | POA: Diagnosis not present

## 2016-02-25 DIAGNOSIS — R808 Other proteinuria: Secondary | ICD-10-CM | POA: Diagnosis not present

## 2016-02-25 DIAGNOSIS — F3342 Major depressive disorder, recurrent, in full remission: Secondary | ICD-10-CM | POA: Diagnosis not present

## 2016-02-25 DIAGNOSIS — E559 Vitamin D deficiency, unspecified: Secondary | ICD-10-CM | POA: Diagnosis not present

## 2016-03-18 ENCOUNTER — Ambulatory Visit: Payer: PRIVATE HEALTH INSURANCE | Attending: Orthopedic Surgery | Admitting: Physical Therapy

## 2016-03-18 ENCOUNTER — Encounter: Payer: Self-pay | Admitting: Physical Therapy

## 2016-03-18 DIAGNOSIS — R531 Weakness: Secondary | ICD-10-CM | POA: Diagnosis present

## 2016-03-18 DIAGNOSIS — M25511 Pain in right shoulder: Secondary | ICD-10-CM | POA: Insufficient documentation

## 2016-03-18 DIAGNOSIS — M75101 Unspecified rotator cuff tear or rupture of right shoulder, not specified as traumatic: Secondary | ICD-10-CM | POA: Diagnosis present

## 2016-03-18 NOTE — Therapy (Signed)
[place] MAIN [facility] SERVICES [address] [place], [place], [address] Phone: [phone]   Fax:  [phone]  Physical Therapy Evaluation  Patient Details  Name: Abigail Moody MRN: [id number] Date of Birth: 25-Jun-1946 Referring Provider: Netta Moody  Encounter Date: 03/18/2016      PT End of Session - 03/18/16 1720    Visit Number 1   Number of Visits 25   Date for PT Re-Evaluation 2016/07/04   Authorization Type g codes   PT Start Time 0510   PT Stop Time 0600   PT Time Calculation (min) 50 min      Past Medical History  Diagnosis Date  . Hypertension   . Diffuse cystic mastopathy   . GERD (gastroesophageal reflux disease)   . Hemorrhoid     Past Surgical History  Procedure Laterality Date  . Carpal tunnel release Bilateral   . Ankle surgery Right 2006  . Mouth surgery  2010  . Toe surgery Left 2006  . Breast biopsy Bilateral   . Tonsillectomy  1959  . Colonoscopy  2008    Dr. Epifanio Lesches  . Trigger finger release Right 2013-2014?    There were no vitals filed for this visit.  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, reaching overhead   Pain Onset More than a month ago            Abigail Moody PT Assessment - 03/19/16 0001    Assessment   Referring Provider Abigail Moody      PAIN: 10/10 to right shoulder POSTURE: WNL  PROM/AROM:  Right Shoulder flex 60, IR 20, ER 20  STRENGTH:  Graded on a 0-5 scale Muscle Group Left Right  Shoulder flex 5 1  Shoulder Abd 5 1  Shoulder Ext 5 1  Shoulder IR/ER 5 1  Elbow 5 2  Wrist/hand 5 3                                       SENSATION: WFL  FUNCTIONAL MOBILITY: Guarded and difficult    OUTCOME MEASURES: TEST Outcome  Interpretation  Quick dash  100% 0= no disabiity                                              PT Education - 03/18/16 1719    Education provided Yes   Education Details Sling    Person(s) Educated Patient   Methods Explanation   Comprehension Verbalized understanding          PT Short Term Goals - 02/04/16 1018    PT SHORT TERM GOAL #1   Title Pt will be I with initial HEP for continued strengthening and mobility by 02/09/16.    Baseline understands   Time 4   Period Weeks   Status Achieved   PT SHORT TERM GOAL #2   Title R shoulder AROM will improve to 90 degrees pain-free for improved overhead reaching activities by 02/09/16   Baseline 155  3/10 pain  Time 4   Period Weeks   Status Partially Met   PT SHORT TERM GOAL #3   Title Pt will be able to demo and verbalize proper posture as it related to upper body, shoulder, and cervical spine by 02/09/16.    Baseline able to verbalize and demonstrate   Time 4   Period Weeks   Status Achieved           PT Long Term Goals - 03/19/16 7035    PT LONG TERM GOAL #1   Title Patient will improve shoulder AROM to > 140 degrees of flexion, scaption, and abduction for improved ability to perform overhead activities   Time 12   Period Weeks   Status New   PT LONG TERM GOAL #2   Title Patient will demonstrate adequate shoulder ROM and strength to be able to shave and dress independently with pain less than 3/10   Time 12   Period Weeks   Status New   PT LONG TERM GOAL #3   Title R shoulder flexion AROM will iprove to 140 degrees in order to 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    Time 12   Period Weeks   Status New   PT LONG TERM GOAL #4   Title Pt pain will be <2/10 throughout day while participating in ADLs and work by 05/04/16.    Time 12   Status New               Plan - 03/18/16 1721    Clinical Impression Statement Patient is s/p surgery 03/12/16 for  rotator cuff . She has decreased ROM and strength and needs assist with ADL's.    Pt will benefit from skilled therapeutic intervention in order to improve on the following deficits Impaired flexibility;Decreased strength;Decreased range of motion;Decreased activity tolerance   Rehab Potential Good   PT Frequency 2x / week   PT Duration 12 weeks   PT Treatment/Interventions Electrical Stimulation;Cryotherapy;Moist Heat;Ultrasound;Manual techniques;Therapeutic activities;Therapeutic exercise   PT Home Exercise Plan Pendulum         Problem List Patient Active Problem List   Diagnosis Date Noted  . Fibrocystic breast 02/24/2014  [name], PT, DPT  [name] 03/19/2016, 9:58 AM  [place] MAIN [facility] SERVICES [address] [place], [place], [address] Phone: [phone]   Fax:  [phone]  Name: Abigail Moody MRN: [id number] Date of Birth: [birth year]

## 2016-03-20 ENCOUNTER — Ambulatory Visit: Payer: PRIVATE HEALTH INSURANCE | Admitting: Physical Therapy

## 2016-03-20 ENCOUNTER — Encounter: Payer: Self-pay | Admitting: Physical Therapy

## 2016-03-20 DIAGNOSIS — M75101 Unspecified rotator cuff tear or rupture of right shoulder, not specified as traumatic: Secondary | ICD-10-CM | POA: Diagnosis not present

## 2016-03-20 DIAGNOSIS — M25511 Pain in right shoulder: Secondary | ICD-10-CM

## 2016-03-20 DIAGNOSIS — R531 Weakness: Secondary | ICD-10-CM

## 2016-03-20 NOTE — Therapy (Signed)
[place] MAIN [facility] SERVICES [address] [place], [place], [address] Phone: [phone]   Fax:  [phone]  Physical Therapy Treatment  Patient Details  Name: PATTON SWISHER MRN: [id number] Date of Birth: May 20, 1946 Referring Provider: Netta Cedars  Encounter Date: 03/20/2016      PT End of Session - 03/20/16 1711    Visit Number 2   Number of Visits 25   Date for PT Re-Evaluation 06/20/16   Authorization Type g codes   PT Start Time 0445   PT Stop Time 0530   PT Time Calculation (min) 45 min      Past Medical History  Diagnosis Date  . Hypertension   . Diffuse cystic mastopathy   . GERD (gastroesophageal reflux disease)   . Hemorrhoid     Past Surgical History  Procedure Laterality Date  . Carpal tunnel release Bilateral   . Ankle surgery Right 2006  . Mouth surgery  2010  . Toe surgery Left 2006  . Breast biopsy Bilateral   . Tonsillectomy  1959  . Colonoscopy  2008    Dr. Epifanio Lesches  . Trigger finger release Right 2013-2014?    There were no vitals filed for this visit.  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  IFC with e-stim x 20 minutes crossed pattern electrodes PROM to left shoulder in supine and sidelying position  Patients pain level changes from 7/10 to 15/10 with treatment.                          PT Education - 03/20/16 1711    Education provided Yes   Education Details wearing sling   Person(s) Educated Patient   Methods Explanation   Comprehension Verbalized understanding          PT Short  Term Goals - 02/04/16 1018    PT SHORT TERM GOAL #1   Title Pt will be I with initial HEP for continued strengthening and mobility by 02/09/16.    Baseline understands   Time 4   Period Weeks   Status Achieved   PT SHORT TERM GOAL #2   Title R shoulder AROM will improve to 90 degrees pain-free for improved overhead reaching activities by 02/09/16   Baseline 155  3/10 pain      Time 4   Period Weeks   Status Partially Met   PT SHORT TERM GOAL #3   Title Pt will be able to demo and verbalize proper posture as it related to upper body, shoulder, and cervical spine by 02/09/16.    Baseline able to verbalize and demonstrate   Time 4   Period Weeks   Status Achieved           PT Long Term Goals - 03/19/16 4695    PT LONG TERM GOAL #1   Title Patient will improve shoulder AROM to > 140 degrees of flexion, scaption, and abduction for improved ability to perform overhead activities  Time 12   Period Weeks   Status New   PT LONG TERM GOAL #2   Title Patient will demonstrate adequate shoulder ROM and strength to be able to shave and dress independently with pain less than 3/10   Time 12   Period Weeks   Status New   PT LONG TERM GOAL #3   Title R shoulder flexion AROM will iprove to 140 degrees in order to 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    Time 12   Period Weeks   Status New   PT LONG TERM GOAL #4   Title Pt pain will be <2/10 throughout day while participating in ADLs and work by 05/04/16.    Time 12   Status New               Plan - 03/20/16 1712    Clinical Impression Statement Patient has high pain level 7/10 prior to PROM and therapy today. She is able to tolerate very little PROM in supine and sidelying positions with e-stim. She was instructed in how to put on the sling.    Pt will benefit from skilled therapeutic intervention in order to improve on the following deficits Impaired flexibility;Decreased strength;Decreased range of  motion;Decreased activity tolerance   Rehab Potential Good   PT Frequency 2x / week   PT Duration 12 weeks   PT Treatment/Interventions Electrical Stimulation;Cryotherapy;Moist Heat;Ultrasound;Manual techniques;Therapeutic activities;Therapeutic exercise   PT Home Exercise Plan Pendulum        Problem List Patient Active Problem List   Diagnosis Date Noted  . Fibrocystic breast 02/24/2014  [name], PT, DPT  Arelia Sneddon S 03/20/2016, 5:16 PM  [place] MAIN [facility] SERVICES [address] [place], [place], [address] Phone: [phone]   Fax:  [phone]  Name: KEYRI SALBERG MRN: [id number] Date of Birth: 01-26-1946

## 2016-03-24 ENCOUNTER — Encounter: Payer: Self-pay | Admitting: Physical Therapy

## 2016-03-24 ENCOUNTER — Ambulatory Visit: Payer: PRIVATE HEALTH INSURANCE | Attending: Orthopedic Surgery | Admitting: Physical Therapy

## 2016-03-24 DIAGNOSIS — M25611 Stiffness of right shoulder, not elsewhere classified: Secondary | ICD-10-CM | POA: Diagnosis present

## 2016-03-24 DIAGNOSIS — M75101 Unspecified rotator cuff tear or rupture of right shoulder, not specified as traumatic: Secondary | ICD-10-CM | POA: Diagnosis present

## 2016-03-24 DIAGNOSIS — R531 Weakness: Secondary | ICD-10-CM | POA: Insufficient documentation

## 2016-03-24 DIAGNOSIS — M6281 Muscle weakness (generalized): Secondary | ICD-10-CM | POA: Insufficient documentation

## 2016-03-24 DIAGNOSIS — M25511 Pain in right shoulder: Secondary | ICD-10-CM | POA: Diagnosis present

## 2016-03-24 NOTE — Therapy (Signed)
[place] MAIN [facility] SERVICES [address] [place], [place], [address] Phone: [phone]   Fax:  [phone]  Physical Therapy Treatment  Patient Details  Name: Abigail Moody MRN: [id number] Date of Birth: 03-30-1946 Referring Provider: Netta Cedars  Encounter Date: 03/24/2016      PT End of Session - 03/24/16 1406    Visit Number 3   Number of Visits 25   Date for PT Re-Evaluation 07-08-2016   Authorization Type g codes   PT Start Time 0145   PT Stop Time 0225   PT Time Calculation (min) 40 min   Equipment Utilized During Treatment Other (comment)  sling   Activity Tolerance Patient limited by pain      Past Medical History  Diagnosis Date  . Hypertension   . Diffuse cystic mastopathy   . GERD (gastroesophageal reflux disease)   . Hemorrhoid     Past Surgical History  Procedure Laterality Date  . Carpal tunnel release Bilateral   . Ankle surgery Right 2006  . Mouth surgery  2010  . Toe surgery Left 2006  . Breast biopsy Bilateral   . Tonsillectomy  1959  . Colonoscopy  2008    Dr. Epifanio Lesches  . Trigger finger release Right 2013-2014?    There were no vitals filed for this visit.  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 Patient was instructed in pendulum exercises and donning sling  correctly.                            PT Education - 03/24/16 1406    Education provided Yes   Education Details wearing sling   Person(s) Educated Patient   Methods Explanation   Comprehension Verbalized understanding          PT Short Term Goals - 02/04/16 1018    PT SHORT TERM GOAL #1   Title Pt will be I with initial HEP for continued strengthening and mobility by 02/09/16.    Baseline understands   Time 4   Period Weeks   Status Achieved   PT SHORT TERM GOAL #2   Title R shoulder AROM will improve to 90 degrees pain-free for improved overhead reaching activities by 02/09/16   Baseline 155  3/10 pain      Time 4   Period Weeks   Status Partially Met   PT SHORT TERM GOAL #3   Title Pt will be able to demo and verbalize proper posture as it related to upper body, shoulder, and cervical spine by 02/09/16.    Baseline able to verbalize and demonstrate   Time 4   Period Weeks   Status Achieved  PT Long Term Goals - 03/19/16 0948    PT LONG TERM GOAL #1   Title Patient will improve shoulder AROM to > 140 degrees of flexion, scaption, and abduction for improved ability to perform overhead activities   Time 12   Period Weeks   Status New   PT LONG TERM GOAL #2   Title Patient will demonstrate adequate shoulder ROM and strength to be able to shave and dress independently with pain less than 3/10   Time 12   Period Weeks   Status New   PT LONG TERM GOAL #3   Title R shoulder flexion AROM will iprove to 140 degrees in order to 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    Time 12   Period Weeks   Status New   PT LONG TERM GOAL #4   Title Pt pain will be <2/10 throughout day while participating in ADLs and work by 05/04/16.    Time 12   Status New               Plan - 03/24/16 1407    Clinical Impression Statement Patient was instructed in pedulum exercise and PROM to RUE. She has high level of pain during  PROM.    Pt will benefit from skilled therapeutic intervention in order to improve on the following deficits Impaired flexibility;Decreased strength;Decreased range of motion;Decreased activity tolerance   Rehab Potential Good   PT Frequency 2x / week   PT Duration 12 weeks   PT Treatment/Interventions Electrical Stimulation;Cryotherapy;Moist Heat;Ultrasound;Manual techniques;Therapeutic activities;Therapeutic exercise   PT Home Exercise Plan Pendulum        Problem List Patient Active Problem List   Diagnosis Date Noted  . Fibrocystic breast 02/24/2014    [name] 03/24/2016, 2:09 PM  [place] MAIN [facility] SERVICES [address] [place], [place], [address] Phone: [phone]   Fax:  [phone]  Name: Abigail Moody MRN: [id number] Date of Birth: 1946-07-11

## 2016-03-26 ENCOUNTER — Ambulatory Visit: Payer: PRIVATE HEALTH INSURANCE | Admitting: Physical Therapy

## 2016-03-26 ENCOUNTER — Encounter: Payer: Self-pay | Admitting: Physical Therapy

## 2016-03-26 DIAGNOSIS — M75101 Unspecified rotator cuff tear or rupture of right shoulder, not specified as traumatic: Secondary | ICD-10-CM | POA: Diagnosis not present

## 2016-03-26 DIAGNOSIS — R531 Weakness: Secondary | ICD-10-CM

## 2016-03-26 NOTE — Therapy (Signed)
[place] MAIN [facility] SERVICES [address] [place], [place], [address] Phone: [phone]   Fax:  [phone]  Physical Therapy Treatment  Patient Details  Name: Abigail Moody MRN: [id number] Date of Birth: February 04, 1946 Referring Provider: Netta Cedars  Encounter Date: 03/26/2016      PT End of Session - 03/26/16 1457    Visit Number 4   Number of Visits 25   Date for PT Re-Evaluation 06-24-16   Authorization Type g codes   PT Start Time 0145   PT Stop Time 0225   PT Time Calculation (min) 40 min   Equipment Utilized During Treatment Other (comment)  sling   Activity Tolerance Patient limited by pain      Past Medical History  Diagnosis Date  . Hypertension   . Diffuse cystic mastopathy   . GERD (gastroesophageal reflux disease)   . Hemorrhoid     Past Surgical History  Procedure Laterality Date  . Carpal tunnel release Bilateral   . Ankle surgery Right 2006  . Mouth surgery  2010  . Toe surgery Left 2006  . Breast biopsy Bilateral   . Tonsillectomy  1959  . Colonoscopy  2008    Dr. Epifanio Lesches  . Trigger finger release Right 2013-2014?    There were no vitals filed for this visit.  Visit Diagnosis:  RCT (rotator cuff tear), right  Weakness      Subjective Assessment - 03/26/16 1456    Subjective Patient reports that her shoulder is hurting 3/10 and her surgery was 03/12/16. She continues to have orders for PROM and no abduction to right shoulder.    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                             PT Education - 03/26/16 1456    Education provided Yes   Education Details donning sling corectly   Person(s) Educated Patient   Methods  Explanation          PT Short Term Goals - 02/04/16 1018    PT SHORT TERM GOAL #1   Title Pt will be I with initial HEP for continued strengthening and mobility by 02/09/16.    Baseline understands   Time 4   Period Weeks   Status Achieved   PT SHORT TERM GOAL #2   Title R shoulder AROM will improve to 90 degrees pain-free for improved overhead reaching activities by 02/09/16   Baseline 155  3/10 pain      Time 4   Period Weeks   Status Partially Met   PT SHORT TERM GOAL #3   Title Pt will be able to demo and verbalize proper posture as it related to upper body, shoulder, and cervical spine by 02/09/16.    Baseline able to verbalize and demonstrate   Time 4   Period Weeks   Status Achieved           PT Long Term Goals - 03/19/16 1884    PT LONG TERM GOAL #1   Title Patient will improve shoulder AROM to > 140 degrees of flexion, scaption, and abduction for improved ability to perform  overhead activities   Time 12   Period Weeks   Status New   PT LONG TERM GOAL #2   Title Patient will demonstrate adequate shoulder ROM and strength to be able to shave and dress independently with pain less than 3/10   Time 12   Period Weeks   Status New   PT LONG TERM GOAL #3   Title R shoulder flexion AROM will iprove to 140 degrees in order to 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    Time 12   Period Weeks   Status New   PT LONG TERM GOAL #4   Title Pt pain will be <2/10 throughout day while participating in ADLs and work by 05/04/16.    Time 12   Status New               Plan - 03/26/16 1457    Clinical Impression Statement Patient is allowed to perform IR/ER with elbow at side, PROM exercises flex/IR/ER, pendulum exercises and recieved e-stim IFC for pain control.   Pt will benefit from skilled therapeutic intervention in order to improve on the following deficits Impaired flexibility;Decreased strength;Decreased range of motion;Decreased activity  tolerance   Rehab Potential Good   PT Frequency 2x / week   PT Duration 12 weeks   PT Treatment/Interventions Electrical Stimulation;Cryotherapy;Moist Heat;Ultrasound;Manual techniques;Therapeutic activities;Therapeutic exercise   PT Home Exercise Plan Pendulum        Problem List Patient Active Problem List   Diagnosis Date Noted  . Fibrocystic breast 02/24/2014    [name] 03/26/2016, 2:59 PM  [place] MAIN [facility] SERVICES [address] [place], [place], [address] Phone: [phone]   Fax:  [phone]  Name: Abigail Moody MRN: [id number] Date of Birth: 09-14-1946

## 2016-03-31 ENCOUNTER — Encounter: Payer: PRIVATE HEALTH INSURANCE | Admitting: Physical Therapy

## 2016-04-01 ENCOUNTER — Encounter: Payer: Self-pay | Admitting: Physical Therapy

## 2016-04-01 ENCOUNTER — Ambulatory Visit: Payer: PRIVATE HEALTH INSURANCE | Admitting: Physical Therapy

## 2016-04-01 DIAGNOSIS — M25511 Pain in right shoulder: Secondary | ICD-10-CM

## 2016-04-01 DIAGNOSIS — M6281 Muscle weakness (generalized): Secondary | ICD-10-CM

## 2016-04-01 DIAGNOSIS — M75101 Unspecified rotator cuff tear or rupture of right shoulder, not specified as traumatic: Secondary | ICD-10-CM | POA: Diagnosis not present

## 2016-04-01 DIAGNOSIS — M25611 Stiffness of right shoulder, not elsewhere classified: Secondary | ICD-10-CM

## 2016-04-01 NOTE — Therapy (Signed)
[place] MAIN [facility] SERVICES [address] [place], [place], [address] Phone: [phone]   Fax:  [phone]  Physical Therapy Treatment  Patient Details  Name: Abigail Moody MRN: [id number] Date of Birth: [birth year] Referring Provider: Netta Cedars  Encounter Date: 04/01/2016      PT End of Session - 04/01/16 1757    Visit Number 5   Number of Visits 25   Date for PT Re-Evaluation 06/10/16   Authorization Type --   Authorization Time Period --   PT Start Time 1612   PT Stop Time 1645   PT Time Calculation (min) 33 min   Equipment Utilized During Treatment Other (comment)  sling   Activity Tolerance Patient limited by pain   Behavior During Therapy [facility] for tasks assessed/performed      Past Medical History  Diagnosis Date  . Hypertension   . Diffuse cystic mastopathy   . GERD (gastroesophageal reflux disease)   . Hemorrhoid     Past Surgical History  Procedure Laterality Date  . Carpal tunnel release Bilateral   . Ankle surgery Right 2006  . Mouth surgery  2010  . Toe surgery Left 2006  . Breast biopsy Bilateral   . Tonsillectomy  1959  . Colonoscopy  2008    Dr. Epifanio Lesches  . Trigger finger release Right 2013-2014?    There were no vitals filed for this visit.      Subjective Assessment - 04/01/16 1636    Subjective Patient reports that her shoulder is hurting 3/10 and her surgery was 03/12/16. She continues to have orders for PROM and no abduction to right shoulder.    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 Yes   Pain Score 4   R shoulder   Pain Descriptors / Indicators Aching   Pain Type Surgical pain   Pain Onset More than a month ago       Manual: PROM R shoulder: flexion, ER/IR at side x 20 each within precautions. Frequent cues for pursed lip breathing to facilitate relaxation and pt's tolerance. Pt tolerated PROM better this  session with pain 4/10.  Therex: R shouler pendulum 32mn x3  R shoulder isometrics with bent elbow: flexion, ext, IR and ER 10x 5 sec each submaximal contractions Cues for set up and proper technique of exercises and maintaining precautions.                           PT Education - 04/01/16 1756    Education provided Yes   Education Details continue pendulum exercise at home   Person(s) Educated Patient   Methods Explanation   Comprehension Verbalized understanding          PT Short Term Goals - 02/04/16 1018    PT SHORT TERM GOAL #1   Title Pt will be I with initial HEP for continued strengthening and mobility by 02/09/16.    Baseline understands   Time 4   Period Weeks   Status Achieved   PT SHORT TERM GOAL #2   Title R shoulder AROM will improve to 90 degrees pain-free for improved overhead reaching activities by 02/09/16   Baseline 155  3/10 pain      Time 4   Period Weeks   Status Partially Met   PT SHORT TERM GOAL #3   Title Pt will be able  to demo and verbalize proper posture as it related to upper body, shoulder, and cervical spine by 02/09/16.    Baseline able to verbalize and demonstrate   Time 4   Period Weeks   Status Achieved           PT Long Term Goals - 04/01/16 1803    PT LONG TERM GOAL #1   Title Patient will improve shoulder AROM to > 140 degrees of flexion, scaption, and abduction for improved ability to perform overhead activities   Time 12   Period Weeks   Status On-going   PT LONG TERM GOAL #2   Title Patient will demonstrate adequate shoulder ROM and strength to be able to shave and dress independently with pain less than 3/10   Time 12   Period Weeks   Status On-going   PT LONG TERM GOAL #3   Title R shoulder flexion AROM will iprove to 140 degrees in order to 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    Time 12   Period Weeks   Status On-going   PT LONG TERM GOAL #4   Title Pt pain will be  <2/10 throughout day while participating in ADLs and work by 05/04/16.    Time 12   Period Weeks   Status On-going               Plan - 04/01/16 1759    Clinical Impression Statement Pt tolerated treatment better this session with less pain with ROM. R shoulder flexion reached 100 degress.   Rehab Potential Good   PT Frequency 2x / week   PT Duration 12 weeks   PT Treatment/Interventions Electrical Stimulation;Cryotherapy;Moist Heat;Ultrasound;Manual techniques;Therapeutic activities;Therapeutic exercise   PT Next Visit Plan continue PROM and gentle strengthening within protocol   PT Home Exercise Plan Pendulum      Patient will benefit from skilled therapeutic intervention in order to improve the following deficits and impairments:  Impaired flexibility, Decreased strength, Decreased range of motion, Decreased activity tolerance  Visit Diagnosis: Pain in right shoulder - Plan: PT plan of care cert/re-cert  Muscle weakness (generalized) - Plan: PT plan of care cert/re-cert  Stiffness of right shoulder, not elsewhere classified - Plan: PT plan of care cert/re-cert     Problem List Patient Active Problem List   Diagnosis Date Noted  . Fibrocystic breast 02/24/2014    Neoma Laming, PT, DPT  04/01/2016, 6:40 PM [place] MAIN [facility] SERVICES [address] [place], [place], [address] Phone: [phone]   Fax:  [phone]  Name: TAMA GROSZ MRN: [id number] Date of Birth: 1946/08/19

## 2016-04-02 ENCOUNTER — Encounter: Payer: PRIVATE HEALTH INSURANCE | Admitting: Physical Therapy

## 2016-04-03 ENCOUNTER — Encounter: Payer: Self-pay | Admitting: Physical Therapy

## 2016-04-03 ENCOUNTER — Ambulatory Visit: Payer: PRIVATE HEALTH INSURANCE | Admitting: Physical Therapy

## 2016-04-03 DIAGNOSIS — M25511 Pain in right shoulder: Secondary | ICD-10-CM

## 2016-04-03 DIAGNOSIS — M6281 Muscle weakness (generalized): Secondary | ICD-10-CM

## 2016-04-03 DIAGNOSIS — M75101 Unspecified rotator cuff tear or rupture of right shoulder, not specified as traumatic: Secondary | ICD-10-CM | POA: Diagnosis not present

## 2016-04-03 DIAGNOSIS — M25611 Stiffness of right shoulder, not elsewhere classified: Secondary | ICD-10-CM

## 2016-04-03 NOTE — Therapy (Signed)
[place] MAIN [facility] SERVICES [address] [place], [place], [address] Phone: [phone]   Fax:  [phone]  Physical Therapy Treatment  Patient Details  Name: Abigail Moody MRN: [id number] Date of Birth: Jun 27, 1946 Referring Provider: Netta Cedars  Encounter Date: 04/03/2016      PT End of Session - 04/03/16 1635    Visit Number 6   Number of Visits 25   Date for PT Re-Evaluation 06/10/16   PT Start Time 1400   PT Stop Time 1440   PT Time Calculation (min) 40 min   Equipment Utilized During Treatment Other (comment)  sling   Activity Tolerance Patient limited by pain   Behavior During Therapy [facility] for tasks assessed/performed      Past Medical History  Diagnosis Date  . Hypertension   . Diffuse cystic mastopathy   . GERD (gastroesophageal reflux disease)   . Hemorrhoid     Past Surgical History  Procedure Laterality Date  . Carpal tunnel release Bilateral   . Ankle surgery Right 2006  . Mouth surgery  2010  . Toe surgery Left 2006  . Breast biopsy Bilateral   . Tonsillectomy  1959  . Colonoscopy  2008    Dr. Epifanio Lesches  . Trigger finger release Right 2013-2014?    There were no vitals filed for this visit.      Subjective Assessment - 04/03/16 1620    Subjective Patient reports she has been doing well and the pain has been managed decently. She continues to have orders for PROM and no abduction to right shoulder.    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 No/denies   Pain Score 2   R shoulder   Pain Orientation Right   Pain Descriptors / Indicators Aching   Pain Type Surgical pain   Pain Onset More than a month ago     Manual: ROM R shoulder flexion, ER and IR with no abduction per protocol Cues for pursed lip breathing to facilitate relaxation and tolerance of ROM.   R UE Therex: Pendulum 3 x 52mn Isometrics: flexion, ER, IR,  ext at submaximal contraction 10x5sec each  Cues for proper technique of exercises to target appropriate muscles and staying within protocol.  Applied ice to R shoulder at end of session up to 10 min (unbilled)                            PT Education - 04/03/16 1627    Education provided Yes   Education Details continue peudulum exercise, sling   Person(s) Educated Patient   Methods Explanation;Demonstration   Comprehension Verbalized understanding;Returned demonstration          PT Short Term Goals - 02/04/16 1018    PT SHORT TERM GOAL #1   Title Pt will be I with initial HEP for continued strengthening and mobility by 02/09/16.    Baseline understands   Time 4   Period Weeks   Status Achieved   PT SHORT TERM GOAL #2   Title R shoulder AROM will improve to 90 degrees pain-free for improved overhead reaching activities by 02/09/16   Baseline 155  3/10 pain      Time 4   Period Weeks   Status Partially Met   PT SHORT TERM GOAL #3   Title Pt will be able to demo and  verbalize proper posture as it related to upper body, shoulder, and cervical spine by 02/09/16.    Baseline able to verbalize and demonstrate   Time 4   Period Weeks   Status Achieved           PT Long Term Goals - 04/01/16 1803    PT LONG TERM GOAL #1   Title Patient will improve shoulder AROM to > 140 degrees of flexion, scaption, and abduction for improved ability to perform overhead activities   Time 12   Period Weeks   Status On-going   PT LONG TERM GOAL #2   Title Patient will demonstrate adequate shoulder ROM and strength to be able to shave and dress independently with pain less than 3/10   Time 12   Period Weeks   Status On-going   PT LONG TERM GOAL #3   Title R shoulder flexion AROM will iprove to 140 degrees in order to 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    Time 12   Period Weeks   Status On-going   PT LONG TERM GOAL #4   Title Pt pain  will be <2/10 throughout day while participating in ADLs and work by 05/04/16.    Time 12   Period Weeks   Status On-going               Plan - 04/03/16 1629    Clinical Impression Statement Pt tolerated PROM well this session with less stiffness present. Continues to guard against pain and requires cues for relaxation.   Rehab Potential Good   PT Frequency 2x / week   PT Duration 12 weeks   PT Treatment/Interventions Electrical Stimulation;Cryotherapy;Moist Heat;Ultrasound;Manual techniques;Therapeutic activities;Therapeutic exercise   PT Next Visit Plan continue PROM and gentle strengthening within protocol   PT Home Exercise Plan Pendulum   Consulted and Agree with Plan of Care Patient      Patient will benefit from skilled therapeutic intervention in order to improve the following deficits and impairments:  Impaired flexibility, Decreased strength, Decreased range of motion, Decreased activity tolerance  Visit Diagnosis: Muscle weakness (generalized)  Stiffness of right shoulder, not elsewhere classified  Pain in right shoulder     Problem List Patient Active Problem List   Diagnosis Date Noted  . Fibrocystic breast 02/24/2014    Neoma Laming, PT, DPT  04/03/2016, 4:46 PM [place] MAIN [facility] SERVICES [address] [place], [place], [address] Phone: [phone]   Fax:  [phone]  Name: Abigail Moody MRN: [id number] Date of Birth: March 07, 1946

## 2016-04-08 ENCOUNTER — Ambulatory Visit: Payer: PRIVATE HEALTH INSURANCE | Admitting: Physical Therapy

## 2016-04-08 ENCOUNTER — Encounter: Payer: Self-pay | Admitting: Physical Therapy

## 2016-04-08 DIAGNOSIS — M75101 Unspecified rotator cuff tear or rupture of right shoulder, not specified as traumatic: Secondary | ICD-10-CM | POA: Diagnosis not present

## 2016-04-08 DIAGNOSIS — M25611 Stiffness of right shoulder, not elsewhere classified: Secondary | ICD-10-CM

## 2016-04-08 DIAGNOSIS — M6281 Muscle weakness (generalized): Secondary | ICD-10-CM

## 2016-04-08 NOTE — Therapy (Signed)
[place] MAIN [facility] SERVICES [address] [place], [place], [address] Phone: [phone]   Fax:  [phone]  Physical Therapy Treatment  Patient Details  Name: Abigail Moody MRN: [id number] Date of Birth: September 28, 1946 Referring Provider: Netta Cedars  Encounter Date: 04/08/2016      PT End of Session - 04/08/16 1552    Visit Number 7   Number of Visits 25   Date for PT Re-Evaluation 06/10/16   PT Start Time 0315   PT Stop Time 0400   PT Time Calculation (min) 45 min   Equipment Utilized During Treatment Other (comment)  sling   Activity Tolerance Patient limited by pain   Behavior During Therapy [facility] for tasks assessed/performed      Past Medical History  Diagnosis Date  . Hypertension   . Diffuse cystic mastopathy   . GERD (gastroesophageal reflux disease)   . Hemorrhoid     Past Surgical History  Procedure Laterality Date  . Carpal tunnel release Bilateral   . Ankle surgery Right 2006  . Mouth surgery  2010  . Toe surgery Left 2006  . Breast biopsy Bilateral   . Tonsillectomy  1959  . Colonoscopy  2008    Dr. Epifanio Lesches  . Trigger finger release Right 2013-2014?    There were no vitals filed for this visit.      Subjective Assessment - 04/08/16 1551    Subjective Patient reports she has been doing well and the pain has been managed decently. She continues to have orders for PROM and no abduction to right shoulder.    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                            PT Education - 04/08/16 1551    Education provided Yes   Education Details  continue pendleum exercises   Person(s) Educated Patient   Methods Explanation   Comprehension Verbalized understanding             PT Long Term Goals - 04/01/16 1803    PT LONG TERM GOAL #1   Title Patient will improve shoulder AROM to > 140 degrees of flexion, scaption, and abduction for improved ability to perform overhead activities   Time 12   Period Weeks   Status On-going   PT LONG TERM GOAL #2   Title Patient will demonstrate adequate shoulder ROM and strength to be able to shave and dress independently with pain less than 3/10   Time 12   Period Weeks   Status On-going   PT LONG TERM GOAL #3   Title R shoulder flexion AROM will iprove to 140 degrees in order to 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    Time 12   Period Weeks   Status On-going   PT LONG TERM GOAL #4   Title Pt pain will be <2/10 throughout day while participating in ADLs and work by 05/04/16.    Time 12   Period Weeks   Status On-going  Plan - 04/08/16 1553    Clinical Impression Statement Patient tolerates PROM to left shoulder and e stim with some increased pain .    Rehab Potential Good   PT Frequency 2x / week   PT Duration 12 weeks   PT Treatment/Interventions Electrical Stimulation;Cryotherapy;Moist Heat;Ultrasound;Manual techniques;Therapeutic activities;Therapeutic exercise   PT Next Visit Plan continue PROM and gentle strengthening within protocol   PT Home Exercise Plan Pendulum   Consulted and Agree with Plan of Care Patient      Patient will benefit from skilled therapeutic intervention in order to improve the following deficits and impairments:  Impaired flexibility, Decreased strength, Decreased range of motion, Decreased activity tolerance  Visit Diagnosis: Muscle weakness (generalized)  Stiffness of right shoulder, not elsewhere classified  RCT (rotator cuff tear), right     Problem List Patient Active Problem List    Diagnosis Date Noted  . Fibrocystic breast 02/24/2014  [name], PT, DPT  [place] 04/08/2016, 3:56 PM  [place] MAIN [facility] SERVICES [address] [place], [place], [address] Phone: [phone]   Fax:  [phone]  Name: Abigail Moody MRN: [id number] Date of Birth: 21-Jan-1946

## 2016-04-10 ENCOUNTER — Ambulatory Visit: Payer: PRIVATE HEALTH INSURANCE | Admitting: Physical Therapy

## 2016-04-10 ENCOUNTER — Encounter: Payer: Self-pay | Admitting: Physical Therapy

## 2016-04-10 DIAGNOSIS — M75101 Unspecified rotator cuff tear or rupture of right shoulder, not specified as traumatic: Secondary | ICD-10-CM | POA: Diagnosis not present

## 2016-04-10 DIAGNOSIS — M6281 Muscle weakness (generalized): Secondary | ICD-10-CM

## 2016-04-10 NOTE — Therapy (Signed)
[place] MAIN [facility] SERVICES [address] [place], [place], [address] Phone: [phone]   Fax:  [phone]  Physical Therapy Treatment  Patient Details  Name: Abigail Moody MRN: [id number] Date of Birth: 1946-06-13 Referring Provider: Netta Cedars  Encounter Date: 04/10/2016      PT End of Session - 04/10/16 1525    Visit Number 8   Number of Visits 25   Date for PT Re-Evaluation 06/10/16   PT Start Time 0315   PT Stop Time 0355   PT Time Calculation (min) 40 min   Equipment Utilized During Treatment Other (comment)  sling   Activity Tolerance Patient limited by pain   Behavior During Therapy [facility] for tasks assessed/performed      Past Medical History  Diagnosis Date  . Hypertension   . Diffuse cystic mastopathy   . GERD (gastroesophageal reflux disease)   . Hemorrhoid     Past Surgical History  Procedure Laterality Date  . Carpal tunnel release Bilateral   . Ankle surgery Right 2006  . Mouth surgery  2010  . Toe surgery Left 2006  . Breast biopsy Bilateral   . Tonsillectomy  1959  . Colonoscopy  2008    Dr. Epifanio Lesches  . Trigger finger release Right 2013-2014?    There were no vitals filed for this visit.      Subjective Assessment - 04/10/16 1524    Subjective Patient reports she has been doing well and the pain has been managed decently. She continues to have orders for PROM and no abduction to right shoulder.    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                           PT Education - 04/10/16 1525    Education provided Yes   Education Details continue HEP   Person(s) Educated Patient   Methods Explanation   Comprehension Verbalized understanding             PT Long Term Goals - 04/01/16 1803    PT LONG TERM GOAL #1   Title Patient will improve shoulder AROM to > 140 degrees of flexion, scaption, and abduction for improved ability to perform overhead activities   Time 12   Period Weeks   Status On-going   PT LONG TERM GOAL #2   Title Patient will demonstrate adequate shoulder ROM and strength to be able to shave and dress independently with pain less than 3/10   Time 12   Period Weeks   Status On-going   PT LONG TERM GOAL #3   Title R shoulder flexion AROM will iprove to 140 degrees in order to 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    Time 12   Period Weeks   Status On-going   PT LONG TERM GOAL #4   Title Pt pain will be <2/10  throughout day while participating in ADLs and work by 05/04/16.    Time 12   Period Weeks   Status On-going               Plan - 04/10/16 1526    Clinical Impression Statement Patient performs PROM to  right shoulder and e-stim with some inreased pain that goes away.    Rehab Potential Good   PT Frequency 2x / week   PT Duration 12 weeks   PT Treatment/Interventions Electrical Stimulation;Cryotherapy;Moist Heat;Ultrasound;Manual techniques;Therapeutic activities;Therapeutic exercise   PT Next Visit Plan continue PROM and gentle strengthening within protocol   PT Home Exercise Plan Pendulum   Consulted and Agree with Plan of Care Patient      Patient will benefit from skilled therapeutic intervention in order to improve the following deficits and impairments:  Impaired flexibility, Decreased strength, Decreased range of motion, Decreased activity tolerance  Visit Diagnosis: Muscle weakness  (generalized)     Problem List Patient Active Problem List   Diagnosis Date Noted  . Fibrocystic breast 02/24/2014   [name], PT, DPT [place], [place] 04/10/2016, 3:27 PM  [place] MAIN [facility] SERVICES [address] [place], [place], [address] Phone: [phone]   Fax:  [phone]  Name: Abigail Moody MRN: [id number] Date of Birth: Aug 01, 1946

## 2016-04-14 ENCOUNTER — Encounter: Payer: Self-pay | Admitting: Physical Therapy

## 2016-04-14 ENCOUNTER — Ambulatory Visit: Payer: PRIVATE HEALTH INSURANCE | Admitting: Physical Therapy

## 2016-04-14 DIAGNOSIS — M6281 Muscle weakness (generalized): Secondary | ICD-10-CM

## 2016-04-14 DIAGNOSIS — M75101 Unspecified rotator cuff tear or rupture of right shoulder, not specified as traumatic: Secondary | ICD-10-CM | POA: Diagnosis not present

## 2016-04-14 DIAGNOSIS — M25611 Stiffness of right shoulder, not elsewhere classified: Secondary | ICD-10-CM

## 2016-04-14 NOTE — Therapy (Signed)
[place] MAIN [facility] SERVICES [address] [place], [place], [address] Phone: [phone]   Fax:  [phone]  Physical Therapy Treatment  Patient Details  Name: Abigail Moody MRN: [id number] Date of Birth: [birth year] Referring Provider: Netta Cedars  Encounter Date: 04/14/2016      PT End of Session - 04/14/16 1401    Visit Number 9   Number of Visits 25   Date for PT Re-Evaluation 06/10/16   PT Start Time 0145   PT Stop Time 0230   PT Time Calculation (min) 45 min   Equipment Utilized During Treatment Other (comment)  sling   Activity Tolerance Patient limited by pain   Behavior During Therapy [facility] for tasks assessed/performed      Past Medical History  Diagnosis Date  . Hypertension   . Diffuse cystic mastopathy   . GERD (gastroesophageal reflux disease)   . Hemorrhoid     Past Surgical History  Procedure Laterality Date  . Carpal tunnel release Bilateral   . Ankle surgery Right 2006  . Mouth surgery  2010  . Toe surgery Left 2006  . Breast biopsy Bilateral   . Tonsillectomy  1959  . Colonoscopy  2008    Dr. Epifanio Lesches  . Trigger finger release Right 2013-2014?    There were no vitals filed for this visit.      Subjective Assessment - 04/14/16 1400    Subjective Patient reports she has been doing well and the pain has been managed decently. She continues to have orders for PROM and no abduction to right shoulder.    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, reaching overhead   Pain Onset More than a month ago     PROM to RUE shoulder flex/ER/IR x 10 x 2 E-stim IFC crossed pattern to right shoulder Manual therapy AP glides grade 1 and 2, inferior glides grade 1 and 2 x 10 No reports of increased pain during therapy, end range discomfort during PROM                            PT Education - 04/14/16 1400    Education provided Yes   Education Details  continue HEP   Person(s) Educated Patient   Methods Explanation   Comprehension Verbalized understanding             PT Long Term Goals - 04/01/16 1803    PT LONG TERM GOAL #1   Title Patient will improve shoulder AROM to > 140 degrees of flexion, scaption, and abduction for improved ability to perform overhead activities   Time 12   Period Weeks   Status On-going   PT LONG TERM GOAL #2   Title Patient will demonstrate adequate shoulder ROM and strength to be able to shave and dress independently with pain less than 3/10   Time 12   Period Weeks   Status On-going   PT LONG TERM GOAL #3   Title R shoulder flexion AROM will iprove to 140 degrees in order to 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    Time 12   Period Weeks   Status On-going   PT LONG TERM GOAL #4   Title Pt pain will be <2/10 throughout day while participating in ADLs and work by 05/04/16.    Time 12   Period Weeks   Status On-going  Plan - 04/14/16 1401    Clinical Impression Statement Patient is able to perform PROM to right shoulder and e-stim with increased pain during end ranges.   Rehab Potential Good   PT Frequency 2x / week   PT Duration 12 weeks   PT Treatment/Interventions Electrical Stimulation;Cryotherapy;Moist Heat;Ultrasound;Manual techniques;Therapeutic activities;Therapeutic exercise   PT Next Visit Plan continue PROM and gentle strengthening within protocol   PT Home Exercise Plan Pendulum   Consulted and Agree with Plan of Care Patient      Patient will benefit from skilled therapeutic intervention in order to improve the following deficits and impairments:  Impaired flexibility, Decreased strength, Decreased range of motion, Decreased activity tolerance  Visit Diagnosis: Muscle weakness (generalized)  Stiffness of right shoulder, not elsewhere classified     Problem List Patient Active Problem List   Diagnosis Date Noted  .  Fibrocystic breast 02/24/2014  [name], PT, DPT  [place], Minette Headland S 04/14/2016, 2:30 PM  [place] MAIN [facility] SERVICES [address] [place], [place], [address] Phone: [phone]   Fax:  [phone]  Name: Abigail [place] MRN: [id number] Date of Birth: 1946/10/20

## 2016-04-16 ENCOUNTER — Ambulatory Visit: Payer: PRIVATE HEALTH INSURANCE | Admitting: Physical Therapy

## 2016-04-16 ENCOUNTER — Encounter: Payer: Self-pay | Admitting: Physical Therapy

## 2016-04-16 DIAGNOSIS — M75101 Unspecified rotator cuff tear or rupture of right shoulder, not specified as traumatic: Secondary | ICD-10-CM | POA: Diagnosis not present

## 2016-04-16 DIAGNOSIS — M6281 Muscle weakness (generalized): Secondary | ICD-10-CM

## 2016-04-16 NOTE — Therapy (Signed)
[place] MAIN [facility] SERVICES [address] [place], [place], [address] Phone: [phone]   Fax:  [phone]  Physical Therapy Treatment  Patient Details  Name: Abigail [place] MRN: [id number] Date of Birth: 03-02-1946 Referring Provider: Netta Cedars  Encounter Date: 04/16/2016      PT End of Session - 04/16/16 1352    Visit Number 10   Number of Visits 25   Date for PT Re-Evaluation 06/10/16   PT Start Time 0145   PT Stop Time 0230   PT Time Calculation (min) 45 min   Equipment Utilized During Treatment Other (comment)  sling   Activity Tolerance Patient limited by pain   Behavior During Therapy [facility] for tasks assessed/performed      Past Medical History  Diagnosis Date  . Hypertension   . Diffuse cystic mastopathy   . GERD (gastroesophageal reflux disease)   . Hemorrhoid     Past Surgical History  Procedure Laterality Date  . Carpal tunnel release Bilateral   . Ankle surgery Right 2006  . Mouth surgery  2010  . Toe surgery Left 2006  . Breast biopsy Bilateral   . Tonsillectomy  1959  . Colonoscopy  2008    Dr. Epifanio Lesches  . Trigger finger release Right 2013-2014?    There were no vitals filed for this visit.      Subjective Assessment - 04/16/16 1351    Subjective Patient reports she has been doing well and the pain has been managed decently, she does not sleep very well at night.  She continues to have orders for PROM and no abduction to right shoulder.    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, reaching overhead   Pain Onset More than a month ago      PROM RUE flex/ER/IR x 10 x 2 E - stim IFC  Crossed pattern to RUE x 20 mintues Manual therapy AP glides grade 2 and 3, inferior glides grade 2 and 3  no reports of pain in right shoulder                           PT Education - 04/16/16 1351    Education provided Yes   Education Details HEP   Person(s) Educated Patient   Methods Explanation   Comprehension Verbalized understanding             PT Long Term Goals - 04/01/16 1803    PT LONG TERM GOAL #1   Title Patient will improve shoulder AROM to > 140 degrees of flexion, scaption, and abduction for improved ability to perform overhead activities   Time 12   Period Weeks   Status On-going   PT LONG TERM GOAL #2   Title Patient will demonstrate adequate shoulder ROM and strength to be able to shave and dress independently with pain less than 3/10   Time 12   Period Weeks   Status On-going   PT LONG TERM GOAL #3   Title R shoulder flexion AROM will iprove to 140 degrees in order to 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    Time 12   Period Weeks   Status On-going   PT LONG TERM GOAL #4   Title Pt pain will be <2/10 throughout day while participating in ADLs and work by 05/04/16.    Time 12   Period Weeks  Status On-going               Plan - 04/16/16 1352    Clinical Impression Statement Patient has some discomfort in end ranges of flexibiity for right  shoulder flex and ER. She continues to perform PROM exericses    Rehab Potential Good   PT Frequency 2x / week   PT Duration 12 weeks   PT Treatment/Interventions Electrical Stimulation;Cryotherapy;Moist Heat;Ultrasound;Manual techniques;Therapeutic activities;Therapeutic exercise   PT Next Visit Plan continue PROM and gentle strengthening within protocol   PT Home Exercise Plan Pendulum   Consulted and Agree with Plan of Care Patient      Patient will benefit from skilled therapeutic intervention in order to improve the following deficits and impairments:  Impaired flexibility, Decreased strength, Decreased range of motion, Decreased activity tolerance  Visit Diagnosis: Muscle weakness (generalized)     Problem List Patient Active Problem List   Diagnosis Date Noted  . Fibrocystic breast 02/24/2014   [name], PT, DPT [place], Minette Headland S 04/16/2016, 2:25 PM  [place] MAIN [facility] SERVICES [address] [place], [place], [address] Phone: [phone]   Fax:  [phone]  Name: Abigail [place] MRN: [id number] Date of Birth: Jan 08, 1946

## 2016-04-21 ENCOUNTER — Encounter: Payer: Self-pay | Admitting: Physical Therapy

## 2016-04-21 ENCOUNTER — Ambulatory Visit: Payer: PRIVATE HEALTH INSURANCE | Attending: Orthopedic Surgery | Admitting: Physical Therapy

## 2016-04-21 DIAGNOSIS — M6281 Muscle weakness (generalized): Secondary | ICD-10-CM | POA: Diagnosis present

## 2016-04-21 DIAGNOSIS — M25511 Pain in right shoulder: Secondary | ICD-10-CM | POA: Diagnosis present

## 2016-04-21 DIAGNOSIS — M25611 Stiffness of right shoulder, not elsewhere classified: Secondary | ICD-10-CM | POA: Insufficient documentation

## 2016-04-21 NOTE — Therapy (Signed)
[place] MAIN [facility] SERVICES [address] [place], [place], [address] Phone: [phone]   Fax:  [phone]  Physical Therapy Treatment/DC Summary  Patient Details  Name: BAYLEE MALANGA MRN: [id number] Date of Birth: June 11, 1946 Referring Provider: Netta Cedars  Encounter Date: 04/21/2016      PT End of Session - 04/21/16 1404    Visit Number 11   Number of Visits 25   Date for PT Re-Evaluation 06/10/16   PT Start Time 0150   PT Stop Time 0230   PT Time Calculation (min) 40 min   Equipment Utilized During Treatment Other (comment)  sling   Activity Tolerance Patient limited by pain   Behavior During Therapy [facility] for tasks assessed/performed      Past Medical History  Diagnosis Date  . Hypertension   . Diffuse cystic mastopathy   . GERD (gastroesophageal reflux disease)   . Hemorrhoid     Past Surgical History  Procedure Laterality Date  . Carpal tunnel release Bilateral   . Ankle surgery Right 2006  . Mouth surgery  2010  . Toe surgery Left 2006  . Breast biopsy Bilateral   . Tonsillectomy  1959  . Colonoscopy  2008    Dr. Epifanio Lesches  . Trigger finger release Right 2013-2014?    There were no vitals filed for this visit.      Subjective Assessment - 04/21/16 1358    Subjective Patient is having some pain but it is not so severe. She is not taking strong pain medicine. 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 She still has bad nights and cannot get comfortable. She is still wearing the brace.    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, reaching overhead   Pain Onset More than a month ago      Therapeutic exercise: PROM to right shoulder x 10 Manual therapy to right shoulder inferior glide grade AP grade 2 and 3 10 bouts E- stim  IFC to right shoulder crossed pattern  Patient has discomfort in end ranges 0 -140                            PT Education - 04/21/16  1403    Education provided Yes   Education Details HEP   Person(s) Educated Patient   Methods Explanation   Comprehension Verbalized understanding             PT Long Term Goals - 04/01/16 1803    PT LONG TERM GOAL #1   Title Patient will improve shoulder AROM to > 140 degrees of flexion, scaption, and abduction for improved ability to perform overhead activities   Time 12   Period Weeks   Status On-going   PT LONG TERM GOAL #2   Title Patient will demonstrate adequate shoulder ROM and strength to be able to shave and dress independently with pain less than 3/10   Time 12   Period Weeks   Status On-going   PT LONG TERM GOAL #3   Title R shoulder flexion AROM will iprove to 140 degrees in order to 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    Time 12   Period Weeks   Status On-going   PT LONG TERM GOAL #4   Title Pt pain will be <2/10 throughout day while participating in ADLs and work by 05/04/16.  Time 12   Period Weeks   Status On-going               Plan - 04/21/16 1405    Clinical Impression Statement Patient performs PROM to RUE and has endrange and mid range discomfort. She continues to have orders for no abduction to right shoulder and PROM to right shoulder.    Rehab Potential Good   PT Frequency 2x / week   PT Duration 12 weeks   PT Treatment/Interventions Electrical Stimulation;Cryotherapy;Moist Heat;Ultrasound;Manual techniques;Therapeutic activities;Therapeutic exercise   PT Next Visit Plan continue PROM and gentle strengthening within protocol   PT Home Exercise Plan Pendulum   Consulted and Agree with Plan of Care Patient      Patient will benefit from skilled therapeutic intervention in order to improve the following deficits and impairments:  Impaired flexibility, Decreased strength, Decreased range of motion, Decreased activity tolerance  Visit Diagnosis: Muscle weakness (generalized)     Problem List Patient  Active Problem List   Diagnosis Date Noted  . Fibrocystic breast 02/24/2014  [name], PT, DPT  [place], Minette Headland S 04/21/2016, 2:29 PM  [place] MAIN [facility] SERVICES [address] [place], [place], [address] Phone: [phone]   Fax:  [phone]  Name: LACHRISTA HAESSLY MRN: [id number] Date of Birth: 01-Jun-1946

## 2016-04-23 ENCOUNTER — Ambulatory Visit: Payer: PRIVATE HEALTH INSURANCE | Admitting: Physical Therapy

## 2016-04-23 ENCOUNTER — Encounter: Payer: Self-pay | Admitting: Physical Therapy

## 2016-04-23 DIAGNOSIS — M6281 Muscle weakness (generalized): Secondary | ICD-10-CM

## 2016-04-23 NOTE — Therapy (Signed)
[place] MAIN [facility] SERVICES [address] [place], [place], [address] Phone: [phone]   Fax:  [phone]  Physical Therapy Treatment  Patient Details  Name: Abigail [place] MRN: [id number] Date of Birth: Aug 29, 1946 Referring Provider: Netta Cedars  Encounter Date: 04/23/2016      PT End of Session - 04/23/16 1505    Visit Number 12   Number of Visits 25   Date for PT Re-Evaluation 06/10/16   PT Start Time 0200   PT Stop Time 0230   PT Time Calculation (min) 30 min   Equipment Utilized During Treatment Other (comment)  sling   Activity Tolerance Patient limited by pain   Behavior During Therapy [facility] for tasks assessed/performed      Past Medical History  Diagnosis Date  . Hypertension   . Diffuse cystic mastopathy   . GERD (gastroesophageal reflux disease)   . Hemorrhoid     Past Surgical History  Procedure Laterality Date  . Carpal tunnel release Bilateral   . Ankle surgery Right 2006  . Mouth surgery  2010  . Toe surgery Left 2006  . Breast biopsy Bilateral   . Tonsillectomy  1959  . Colonoscopy  2008    Dr. Epifanio Lesches  . Trigger finger release Right 2013-2014?    There were no vitals filed for this visit.      Subjective Assessment - 04/23/16 1447    Subjective Patient saw the MD and has new orders.   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                            PT Education - 04/23/16 1505    Education provided Yes   Education Details pulley instructions   Person(s) Educated Patient   Comprehension Verbalized understanding             PT Long Term Goals - 04/01/16 1803    PT LONG TERM GOAL #1   Title Patient will improve shoulder AROM to > 140 degrees of flexion, scaption, and abduction for improved ability to perform overhead activities   Time 12   Period Weeks   Status On-going   PT LONG TERM GOAL #2   Title Patient will demonstrate adequate shoulder ROM and strength to be able to shave and dress independently with pain less than 3/10   Time 12   Period Weeks   Status On-going   PT LONG TERM GOAL #3   Title R shoulder flexion AROM will iprove to 140 degrees in order to 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    Time 12   Period Weeks   Status On-going   PT LONG TERM GOAL #4   Title Pt pain will be <2/10 throughout day while participating in ADLs and  work by 05/04/16.    Time 12   Period Weeks   Status On-going               Plan - 04/23/16 1506    Clinical Impression Statement Patient performs AAROM in sitting and supine with arm ranger, table top and cane assisted, and theraball for support.    Rehab Potential Good   PT Frequency 2x / week   PT Duration 12 weeks   PT Treatment/Interventions Electrical Stimulation;Cryotherapy;Moist Heat;Ultrasound;Manual techniques;Therapeutic activities;Therapeutic exercise   PT Next Visit Plan continue PROM and gentle strengthening within protocol   PT Home Exercise Plan Pendulum   Consulted and Agree with Plan of Care Patient      Patient will benefit from skilled therapeutic intervention in order to improve the following deficits and impairments:  Impaired flexibility, Decreased strength, Decreased range of motion, Decreased activity tolerance  Visit  Diagnosis: Muscle weakness (generalized)     Problem List Patient Active Problem List   Diagnosis Date Noted  . Fibrocystic breast 02/24/2014  [name], PT, DPT  [place], Minette Headland S 04/23/2016, 3:07 PM  [place] MAIN [facility] SERVICES [address] [place], [place], [address] Phone: [phone]   Fax:  [phone]  Name: Abigail [place] MRN: [id number] Date of Birth: 24-Dec-1945

## 2016-04-28 ENCOUNTER — Encounter: Payer: PRIVATE HEALTH INSURANCE | Admitting: Physical Therapy

## 2016-04-29 ENCOUNTER — Encounter: Payer: Self-pay | Admitting: Physical Therapy

## 2016-04-29 ENCOUNTER — Ambulatory Visit: Payer: PRIVATE HEALTH INSURANCE | Admitting: Physical Therapy

## 2016-04-29 DIAGNOSIS — M6281 Muscle weakness (generalized): Secondary | ICD-10-CM

## 2016-04-30 ENCOUNTER — Encounter: Payer: PRIVATE HEALTH INSURANCE | Admitting: Physical Therapy

## 2016-04-30 NOTE — Therapy (Signed)
[place] MAIN [facility] SERVICES [address] [place], [place], [address] Phone: [phone]   Fax:  [phone]  Physical Therapy Treatment  Patient Details  Name: HEDAYA CHINERY MRN: [id number] Date of Birth: 1946-09-09 Referring Provider: Netta Cedars  Encounter Date: 04/29/2016      PT End of Session - 04/29/16 1801    Visit Number 13   Number of Visits 25   Date for PT Re-Evaluation 06/10/16   PT Start Time (p) 0535   PT Stop Time 0615   PT Time Calculation (min) (p) 40 min   Equipment Utilized During Treatment Other (comment)  sling   Activity Tolerance Patient limited by pain   Behavior During Therapy [facility] for tasks assessed/performed      Past Medical History  Diagnosis Date  . Hypertension   . Diffuse cystic mastopathy   . GERD (gastroesophageal reflux disease)   . Hemorrhoid     Past Surgical History  Procedure Laterality Date  . Carpal tunnel release Bilateral   . Ankle surgery Right 2006  . Mouth surgery  2010  . Toe surgery Left 2006  . Breast biopsy Bilateral   . Tonsillectomy  1959  . Colonoscopy  2008    Dr. Epifanio Lesches  . Trigger finger release Right 2013-2014?    There were no vitals filed for this visit.      Subjective Assessment - 04/29/16 1800    Subjective Patient saw the MD and has new orders progressing to [facility] and then to AROM.   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 Patient has gone back to  work , light duty, and is having increased pain to right shoulder.                           PT Education - 04/29/16 1801    Education provided Yes   Education Details pulley instructions and AAROM exercises   Person(s) Educated Patient   Comprehension Verbalized understanding             PT Long Term Goals - 04/01/16 1803    PT LONG TERM GOAL #1   Title Patient will improve shoulder AROM to > 140 degrees of flexion, scaption, and abduction for improved ability to perform overhead activities   Time 12   Period Weeks   Status On-going   PT LONG TERM GOAL #2   Title Patient will demonstrate adequate shoulder ROM and strength to be able to shave and dress independently with pain less than 3/10   Time 12   Period Weeks   Status On-going   PT LONG TERM GOAL #3   Title R shoulder flexion AROM will iprove to 140 degrees in order to 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    Time 12   Period Weeks   Status On-going   PT LONG TERM  GOAL #4   Title Pt pain will be <2/10 throughout day while participating in ADLs and work by 05/04/16.    Time 12   Period Weeks   Status On-going               Plan - 04/30/16 [id number]    Clinical Impression Statement Patient is able to perform AAROM in sitting and supine with arm ranger, finger ladder, and cane assisted ROM with pain behaviors.    Rehab Potential Good   PT Frequency 2x / week   PT Duration 12 weeks   PT Treatment/Interventions Electrical Stimulation;Cryotherapy;Moist Heat;Ultrasound;Manual techniques;Therapeutic activities;Therapeutic exercise   PT Next Visit Plan continue PROM and gentle strengthening within protocol   PT Home Exercise Plan Pendulum   Consulted and Agree with Plan of Care Patient      Patient will benefit from skilled therapeutic intervention in order to improve the following deficits and impairments:  Impaired flexibility, Decreased strength, Decreased range of  motion, Decreased activity tolerance  Visit Diagnosis: Muscle weakness (generalized)     Problem List Patient Active Problem List   Diagnosis Date Noted  . Fibrocystic breast 02/24/2014  [name], PT, DPT  [place] S 04/30/2016, 9:23 AM  [place] MAIN [facility] SERVICES [address] [place], [place], [address] Phone: [phone]   Fax:  [phone]  Name: CYLAH FRIEDT MRN: [id number] Date of Birth: 1946-09-09

## 2016-05-01 ENCOUNTER — Ambulatory Visit: Payer: PRIVATE HEALTH INSURANCE | Admitting: Physical Therapy

## 2016-05-01 ENCOUNTER — Encounter: Payer: Self-pay | Admitting: Physical Therapy

## 2016-05-01 DIAGNOSIS — M6281 Muscle weakness (generalized): Secondary | ICD-10-CM | POA: Diagnosis not present

## 2016-05-01 DIAGNOSIS — M25511 Pain in right shoulder: Secondary | ICD-10-CM

## 2016-05-01 DIAGNOSIS — M25611 Stiffness of right shoulder, not elsewhere classified: Secondary | ICD-10-CM

## 2016-05-01 NOTE — Patient Instructions (Addendum)
Extension (Isometric)    Place left bent elbow and back of arm against wall. Press elbow against wall. Hold __5__ seconds. Repeat _5-10___ times. Do __2__ sessions per day.  http://gt2.exer.us/112   Copyright  VHI. All rights reserved.  Flexion (Isometric)    Press right fist against wall. Hold __5__ seconds. Repeat __5-10__ times. Do _2___ sessions per day.  http://gt2.exer.us/114   Copyright  VHI. All rights reserved.  Internal Rotation (Isometric)    Place palm of right fist against door frame, with elbow bent. Press fist against door frame. Hold _5___ seconds. Repeat _5-10___ times. Do _2___ sessions per day.  http://gt2.exer.us/108   Copyright  VHI. All rights reserved.  SHOULDER: Abduction (Isometric)    Use wall as resistance. Press arm against pillow. Keep elbow bent. Hold _5__ seconds. _5-10__ reps per set, _2__ sets per day, __5_ days per week  Copyright  VHI. All rights reserved.  Extension - Prone (Dumbbell)    Lie with right arm hanging off side of bed. Lift hand back and up. Repeat __10__ times per set. Do ___2_ sets per session. Do __5__ sessions per week. Use ___0_ lb weight.   Copyright  VHI. All rights reserved.  External Rotation (Eccentric), Active - Side-Lying    Lie on side, right  arm on top, elbow bent to 90, towel under elbow. slowly lift forearm of affected arm. Slowly lower affected arm. _10__ reps per set, 2___ sets per day, _5__ days per week.   http://ecce.exer.us/189   Copyright  VHI. All rights reserved.  FLEXION: Side-Lying - Elbow Flexed (Active)    Lie on left side, right arm bent. Raise arm up above head, keeping elbow bent. Use _0__ lbs. Perform __2_ sets of _10__ repetitions. Perform _2__ sessions per day.  Copyright  VHI. All rights reserved.

## 2016-05-01 NOTE — Therapy (Signed)
[place] MAIN [facility] SERVICES [address] [place], [place], [address] Phone: [phone]   Fax:  [phone]  Physical Therapy Treatment  Patient Details  Name: Abigail Moody MRN: [id number] Date of Birth: [birth year] Referring Provider: Netta Cedars  Encounter Date: 05/01/2016      PT End of Session - 05/01/16 1721    Visit Number 14   Number of Visits 25   Date for PT Re-Evaluation 06/10/16   PT Start Time [id number]   PT Stop Time 1730   PT Time Calculation (min) 45 min   Equipment Utilized During Treatment Other (comment)   Activity Tolerance Patient limited by pain   Behavior During Therapy [facility] for tasks assessed/performed      Past Medical History  Diagnosis Date  . Hypertension   . Diffuse cystic mastopathy   . GERD (gastroesophageal reflux disease)   . Hemorrhoid     Past Surgical History  Procedure Laterality Date  . Carpal tunnel release Bilateral   . Ankle surgery Right 2006  . Mouth surgery  2010  . Toe surgery Left 2006  . Breast biopsy Bilateral   . Tonsillectomy  1959  . Colonoscopy  2008    Dr. Epifanio Lesches  . Trigger finger release Right 2013-2014?    There were no vitals filed for this visit.      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 Yes   Pain Score 6    Pain Location Shoulder   Pain Orientation Right   Pain Descriptors / Indicators Aching;Sore;Throbbing   Pain Type Surgical pain   Pain Onset More than a month ago        TREATMENT: UE ranger RUE in sitting: Flexion/extension x10 Circles x10 Horizontal abduction/adduction x10  Standing with UE ranger RUE: Flexion/extension x5 Circles x5 With min VCs to avoid shoulder elevation and to increase elbow extension for  better shoulder ROM;  Instructed patient in RUE isometric exercise with wall: Flexion, extension, IR, abduction 5 sec hold x5 each with min VCs for correct positioning and technique; Also instructed patient to avoid painful ROM;  Prone: RUE shoulder extension AROM x10 RUE low row AROM x10 RUE mid row AAROM x10 Patient required min VCs for increasing scapular retraction and to avoid painful ROM; she had increased difficulty with rows due to weakness;  Left sidelying: RUE shoulder ER 2x10 AROM with min VCs to keep elbow against side for better rotation; RUE shoulder flexion AROM with tactile cues to increase ROM to tolerance x10 reps;  Supine: RUE shoulder abduction AAROM to 90 degrees x10 reps; BUE wand chest press x10 with min VCs to use LUE to assist with lifting RUE; BUE wand shoulder flexion x10 with cues to avoid painful ROM;  Patient required min-moderate verbal/tactile cues for correct exercise technique; She was able to tolerate better ROM with less guarding with increased repetition. She reports increased shoulder pain with advanced exercise, but then less discomfort at rest; Advanced HEP   Finished with cryotherapy to right shoulder in sitting x5 min Lysle Morales)                       PT Education - 05/01/16 1721    Education provided Yes  Education Details shoulder ROM, strengthening, HEP   Person(s) Educated Patient   Methods Explanation;Verbal cues;Handout   Comprehension Verbalized understanding;Returned demonstration;Verbal cues required             PT Long Term Goals - 04/01/16 1803    PT LONG TERM GOAL #1   Title Patient will improve shoulder AROM to > 140 degrees of flexion, scaption, and abduction for improved ability to perform overhead activities   Time 12   Period Weeks   Status On-going   PT LONG TERM GOAL #2   Title Patient will demonstrate adequate shoulder ROM and strength to be able to shave and dress independently with pain  less than 3/10   Time 12   Period Weeks   Status On-going   PT LONG TERM GOAL #3   Title R shoulder flexion AROM will iprove to 140 degrees in order to 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    Time 12   Period Weeks   Status On-going   PT LONG TERM GOAL #4   Title Pt pain will be <2/10 throughout day while participating in ADLs and work by 05/04/16.    Time 12   Period Weeks   Status On-going               Plan - 05/01/16 1722    Clinical Impression Statement Instructed patient in advanced RUE shoulder ROM and strengthening exercise. Initiated prone and isometric strengthening. She seems to be most limited with weakness as she is able to tolerate better ROM with AAROM. Patient would benefit from additional skilled PT intervention to improve shoulder ROM and reduce pain with ADLs.    Rehab Potential Good   PT Frequency 2x / week   PT Duration 12 weeks   PT Treatment/Interventions Electrical Stimulation;Cryotherapy;Moist Heat;Ultrasound;Manual techniques;Therapeutic activities;Therapeutic exercise   PT Next Visit Plan AAROM/strengthening as tolerated;    PT Home Exercise Plan advanced- see patient instructions;    Consulted and Agree with Plan of Care Patient      Patient will benefit from skilled therapeutic intervention in order to improve the following deficits and impairments:  Impaired flexibility, Decreased strength, Decreased range of motion, Decreased activity tolerance  Visit Diagnosis: Muscle weakness (generalized)  Stiffness of right shoulder, not elsewhere classified  Pain in right shoulder     Problem List Patient Active Problem List   Diagnosis Date Noted  . Fibrocystic breast 02/24/2014    Darroll Bredeson PT, DPT 05/01/2016, 5:25 PM  [place] MAIN [facility] SERVICES [address] [place], [place], [address] Phone: [phone]   Fax:  [phone]  Name: Abigail Moody MRN:  [id number] Date of Birth: [birth year]

## 2016-05-05 ENCOUNTER — Ambulatory Visit: Payer: PRIVATE HEALTH INSURANCE | Admitting: Physical Therapy

## 2016-05-06 ENCOUNTER — Ambulatory Visit: Payer: PRIVATE HEALTH INSURANCE

## 2016-05-06 DIAGNOSIS — M6281 Muscle weakness (generalized): Secondary | ICD-10-CM

## 2016-05-07 ENCOUNTER — Ambulatory Visit: Payer: PRIVATE HEALTH INSURANCE | Admitting: Physical Therapy

## 2016-05-08 ENCOUNTER — Ambulatory Visit: Payer: PRIVATE HEALTH INSURANCE | Attending: Orthopedic Surgery | Admitting: Physical Therapy

## 2016-05-08 ENCOUNTER — Encounter: Payer: Self-pay | Admitting: Physical Therapy

## 2016-05-08 DIAGNOSIS — M75101 Unspecified rotator cuff tear or rupture of right shoulder, not specified as traumatic: Secondary | ICD-10-CM | POA: Insufficient documentation

## 2016-05-08 DIAGNOSIS — M25611 Stiffness of right shoulder, not elsewhere classified: Secondary | ICD-10-CM | POA: Insufficient documentation

## 2016-05-08 DIAGNOSIS — M25511 Pain in right shoulder: Secondary | ICD-10-CM | POA: Diagnosis present

## 2016-05-08 DIAGNOSIS — R531 Weakness: Secondary | ICD-10-CM | POA: Insufficient documentation

## 2016-05-08 DIAGNOSIS — M6281 Muscle weakness (generalized): Secondary | ICD-10-CM | POA: Diagnosis present

## 2016-05-12 ENCOUNTER — Encounter: Payer: PRIVATE HEALTH INSURANCE | Admitting: Physical Therapy

## 2016-05-12 NOTE — Therapy (Signed)
[facility] [place], [place], [address] Phone: [phone]   Fax:  [phone]  Physical Therapy Treatment  Patient Details  Name: Abigail Moody MRN: [id number] Date of Birth: 1946/01/18 Referring Provider: Netta Cedars  Encounter Date: 05/08/2016      PT End of Session - 05/12/16 0815    Visit Number 16   Number of Visits 25   Date for PT Re-Evaluation 06/10/16   PT Start Time 0930   PT Stop Time 1015   PT Time Calculation (min) 45 min   Activity Tolerance Patient limited by pain   Behavior During Therapy [facility] for tasks assessed/performed      Past Medical History  Diagnosis Date  . Hypertension   . Diffuse cystic mastopathy   . GERD (gastroesophageal reflux disease)   . Hemorrhoid     Past Surgical History  Procedure Laterality Date  . Carpal tunnel release Bilateral   . Ankle surgery Right 2006  . Mouth surgery  2010  . Toe surgery Left 2006  . Breast biopsy Bilateral   . Tonsillectomy  1959  . Colonoscopy  2008    Dr. Epifanio Lesches  . Trigger finger release Right 2013-2014?    There were no vitals filed for this visit.                                 PT Education - 05/12/16 [phone]    Education provided Yes   Education Details plan of care, exercise form/rationale, importance of gaining ROM   Person(s) Educated Patient   Methods Explanation;Tactile cues;Verbal cues   Comprehension Verbalized understanding;Verbal cues required;Tactile cues required;Need further instruction          PT Short Term Goals - 02/04/16 1018    PT SHORT TERM GOAL #1   Title Pt will be I with initial HEP for continued strengthening and mobility by 02/09/16.    Baseline understands   Time 4   Period Weeks   Status Achieved   PT SHORT TERM GOAL #2   Title R shoulder AROM will improve to 90 degrees pain-free for improved overhead reaching activities by 02/09/16   Baseline 155  3/10 pain       Time 4   Period Weeks   Status Partially Met   PT SHORT TERM GOAL #3   Title Pt will be able to demo and verbalize proper posture as it related to upper body, shoulder, and cervical spine by 02/09/16.    Baseline able to verbalize and demonstrate   Time 4   Period Weeks   Status Achieved           PT Long Term Goals - 04/01/16 1803    PT LONG TERM GOAL #1   Title Patient will improve shoulder AROM to > 140 degrees of flexion, scaption, and abduction for improved ability to perform overhead activities   Time 12   Period Weeks   Status On-going   PT LONG TERM GOAL #2   Title Patient will demonstrate adequate shoulder ROM and strength to be able to shave and dress independently with pain less than 3/10   Time 12   Period Weeks   Status On-going   PT LONG TERM GOAL #3   Title R shoulder flexion AROM will iprove to 140 degrees in order to 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    Time  12   Period Weeks   Status On-going   PT LONG TERM GOAL #4   Title Pt pain will be <2/10 throughout day while participating in ADLs and work by 05/04/16.    Time 12   Period Weeks   Status On-going               Plan - 05/12/16 0815    Clinical Impression Statement pt protocol calls for return of ROM at this point which will continue to be focus of treatment with appropriate strengthening as progressed   Rehab Potential Good   PT Treatment/Interventions Electrical Stimulation;Cryotherapy;Moist Heat;Ultrasound;Manual techniques;Therapeutic activities;Therapeutic exercise   PT Next Visit Plan AAROM/strengthening as tolerated;    Consulted and Agree with Plan of Care Patient      Patient will benefit from skilled therapeutic intervention in order to improve the following deficits and impairments:     Visit Diagnosis: Muscle weakness (generalized)  Stiffness of right shoulder, not elsewhere classified  Pain in right shoulder     Problem List Patient  Active Problem List   Diagnosis Date Noted  . Fibrocystic breast 02/24/2014    This note completed for visit on 05/08/2016.  Markesia Crilly C. Oksana Deberry PT, DPT 05/12/2016 8:18 AM   [place], [place], [address] Phone: [phone]   Fax:  [phone]  Name: Abigail Moody MRN: [id number] Date of Birth: [birth year]

## 2016-05-13 ENCOUNTER — Ambulatory Visit: Payer: PRIVATE HEALTH INSURANCE | Admitting: Physical Therapy

## 2016-05-13 DIAGNOSIS — M6281 Muscle weakness (generalized): Secondary | ICD-10-CM

## 2016-05-13 DIAGNOSIS — M75101 Unspecified rotator cuff tear or rupture of right shoulder, not specified as traumatic: Secondary | ICD-10-CM

## 2016-05-13 DIAGNOSIS — M25611 Stiffness of right shoulder, not elsewhere classified: Secondary | ICD-10-CM

## 2016-05-13 DIAGNOSIS — R531 Weakness: Secondary | ICD-10-CM

## 2016-05-13 DIAGNOSIS — M25511 Pain in right shoulder: Secondary | ICD-10-CM

## 2016-05-13 NOTE — Therapy (Addendum)
[place], [place], [address] Phone: [phone]   Fax:  [phone]  Physical Therapy Treatment  Patient Details  Name: Abigail Moody MRN: [id number] Date of Birth: 06/27/1946 Referring Provider: Netta Cedars  Encounter Date: 05/13/2016      PT End of Session - 05/13/16 1315    Visit Number 17   Number of Visits 25   Date for PT Re-Evaluation 06/10/16   PT Start Time 1102   PT Stop Time 1200   PT Time Calculation (min) 58 min      Past Medical History  Diagnosis Date  . Hypertension   . Diffuse cystic mastopathy   . GERD (gastroesophageal reflux disease)   . Hemorrhoid     Past Surgical History  Procedure Laterality Date  . Carpal tunnel release Bilateral   . Ankle surgery Right 2006  . Mouth surgery  2010  . Toe surgery Left 2006  . Breast biopsy Bilateral   . Tonsillectomy  1959  . Colonoscopy  2008    Dr. Epifanio Lesches  . Trigger finger release Right 2013-2014?    There were no vitals filed for this visit.      Subjective Assessment - 05/13/16 1105    Currently in Pain? Yes   Pain Score 4    Pain Location Shoulder   Pain Orientation Right   Aggravating Factors  reaching, doing har, donning shirts   Pain Relieving Factors ice                         [facility] Adult PT Treatment/Exercise - 05/13/16 0001    Shoulder Exercises: Supine   Other Supine Exercises supine cane chest press with small ROM pullovers, ER all very small slow ROM due to increased pain today.    Shoulder Exercises: Standing   Other Standing Exercises UE ranger from floor. Flexion, Extension, IR   Manual Therapy   Passive ROM flexion, abduction ER                PT Education - 05/12/16 0814    Education provided Yes   Education Details plan of care, exercise form/rationale, importance of gaining ROM   Person(s) Educated Patient   Methods Explanation;Tactile cues;Verbal cues   Comprehension Verbalized understanding;Verbal cues required;Tactile cues required;Need further instruction          PT Short Term Goals - 02/04/16 1018    PT SHORT TERM GOAL #1   Title Pt will be I with initial HEP for continued strengthening and mobility by 02/09/16.    Baseline understands   Time 4   Period Weeks   Status Achieved   PT SHORT TERM GOAL #2   Title R shoulder AROM will improve to 90 degrees pain-free for improved overhead reaching activities by 02/09/16   Baseline 155  3/10 pain      Time 4   Period Weeks   Status Partially Met   PT SHORT TERM GOAL #3   Title Pt will be able to demo and verbalize proper posture as it related to upper body, shoulder, and cervical spine by 02/09/16.    Baseline able to verbalize and demonstrate   Time 4   Period Weeks   Status Achieved           PT Long Term Goals - 04/01/16 1803    PT LONG TERM GOAL #1   Title Patient will improve shoulder AROM to > 140 degrees  of flexion, scaption, and abduction for improved ability to perform overhead activities   Time 12   Period Weeks   Status On-going   PT LONG TERM GOAL #2   Title Patient will demonstrate adequate shoulder ROM and strength to be able to shave and dress independently with pain less than 3/10   Time 12   Period Weeks   Status On-going   PT LONG TERM GOAL #3   Title R shoulder flexion AROM will iprove to 140 degrees in order to 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    Time 12   Period Weeks   Status On-going   PT LONG TERM GOAL #4   Title Pt pain will be <2/10 throughout day while participating in ADLs and work by 05/04/16.    Time 12   Period Weeks   Status On-going               Plan - 05/13/16 1131    Clinical Impression Statement Instructed pt in AAROM and PROM as tolerated. She reports increased pain today possibly due to the weather. Used UE ranger to work on shoulder flexion, extension and IR with good tolerance.    PT  Next Visit Plan AAROM/strengthening as tolerated;       Patient will benefit from skilled therapeutic intervention in order to improve the following deficits and impairments:  Impaired flexibility, Decreased strength, Decreased range of motion, Decreased activity tolerance  Visit Diagnosis: Muscle weakness (generalized)  Stiffness of right shoulder, not elsewhere classified  Pain in right shoulder  RCT (rotator cuff tear), right  Weakness     Problem List Patient Active Problem List   Diagnosis Date Noted  . Fibrocystic breast 02/24/2014    Dorene Ar, PTA 05/13/2016, 1:16 PM  [place], [place], [address] Phone: [phone]   Fax:  [phone]  Name: Abigail Moody MRN: [id number] Date of Birth: 1946/02/11

## 2016-05-14 ENCOUNTER — Ambulatory Visit: Payer: PRIVATE HEALTH INSURANCE | Admitting: Physical Therapy

## 2016-05-15 ENCOUNTER — Ambulatory Visit: Payer: PRIVATE HEALTH INSURANCE | Admitting: Physical Therapy

## 2016-05-15 DIAGNOSIS — M25511 Pain in right shoulder: Secondary | ICD-10-CM

## 2016-05-15 DIAGNOSIS — M6281 Muscle weakness (generalized): Secondary | ICD-10-CM

## 2016-05-15 DIAGNOSIS — M25611 Stiffness of right shoulder, not elsewhere classified: Secondary | ICD-10-CM

## 2016-05-15 NOTE — Therapy (Signed)
[place], [place], [address] Phone: [phone]   Fax:  [phone]  Physical Therapy Treatment  Patient Details  Name: Abigail Moody MRN: [id number] Date of Birth: [birth year] Referring Provider: Netta Cedars  Encounter Date: 05/15/2016      PT End of Session - 05/15/16 1551    Visit Number 16   Number of Visits 25   Date for PT Re-Evaluation 06-24-16   Authorization Type g codes   PT Start Time 3825   PT Stop Time 0445   PT Time Calculation (min) 56 min      Past Medical History  Diagnosis Date  . Hypertension   . Diffuse cystic mastopathy   . GERD (gastroesophageal reflux disease)   . Hemorrhoid     Past Surgical History  Procedure Laterality Date  . Carpal tunnel release Bilateral   . Ankle surgery Right 2006  . Mouth surgery  2010  . Toe surgery Left 2006  . Breast biopsy Bilateral   . Tonsillectomy  1959  . Colonoscopy  2008    Dr. Epifanio Lesches  . Trigger finger release Right 2013-2014?    There were no vitals filed for this visit.      Subjective Assessment - 05/15/16 1551    Currently in Pain? Yes   Pain Score 5    Pain Location Shoulder   Pain Descriptors / Indicators Throbbing            [facility] PT Assessment - 05/15/16 0001    PROM   Right Shoulder Flexion 120 Degrees   Right Shoulder ABduction 100 Degrees   Right Shoulder Internal Rotation 45 Degrees   Right Shoulder External Rotation 35 Degrees                     [facility] Adult PT Treatment/Exercise - 05/15/16 0001    Shoulder Exercises: Supine   Other Supine Exercises supine clasped hands pullovers and chest press x 10 each    Shoulder Exercises: Standing   Other Standing Exercises UE ranger from floor.  and from 6 inch step to 90 degrees then horizontal abdct and addct at 90 flexion also on wall level 12 and stepping forward to 120 degrees   Cryotherapy   Number Minutes Cryotherapy 15 Minutes    Cryotherapy Location Shoulder   Type of Cryotherapy Ice pack   Manual Therapy   Passive ROM flexion, abduction ER, IR                PT Education - 05/15/16 1449    Education provided Yes   Education Details being self aware of shoulder shrug and try to correct it   Person(s) Educated Patient   Methods Explanation   Comprehension Verbalized understanding;Verbal cues required;Tactile cues required          PT Short Term Goals - 02/04/16 1018    PT SHORT TERM GOAL #1   Title Pt will be I with initial HEP for continued strengthening and mobility by 02/09/16.    Baseline understands   Time 4   Period Weeks   Status Achieved   PT SHORT TERM GOAL #2   Title R shoulder AROM will improve to 90 degrees pain-free for improved overhead reaching activities by 02/09/16   Baseline 155  3/10 pain      Time 4   Period Weeks   Status Partially Met   PT SHORT TERM GOAL #3   Title Pt will be  able to demo and verbalize proper posture as it related to upper body, shoulder, and cervical spine by 02/09/16.    Baseline able to verbalize and demonstrate   Time 4   Period Weeks   Status Achieved           PT Long Term Goals - 05/15/16 1700    PT LONG TERM GOAL #1   Title Patient will improve shoulder AROM to > 140 degrees of flexion, scaption, and abduction for improved ability to perform overhead activities   Time 12   Period Weeks   Status On-going   PT LONG TERM GOAL #2   Title Patient will demonstrate adequate shoulder ROM and strength to be able to shave and dress independently with pain less than 3/10   Period Weeks   Status On-going   PT LONG TERM GOAL #3   Title R shoulder flexion AROM will iprove to 140 degrees in order to 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    Time 12   Period Weeks   Status On-going   PT LONG TERM GOAL #4   Title Pt pain will be <2/10 throughout day while participating in ADLs and work by 05/04/16.    Time 12   Period  Weeks   Status On-going               Plan - 05/15/16 1658    Clinical Impression Statement Pt deomstrates less pain behaviors today with AAROM exericses. She continues to be limited by pain with PROM. UE ranger used for AAROm today with good tolerance. Used airdyne UBE with no resistance for 2 minutes forward with good tolerance.    PT Next Visit Plan AAROM/strengthening as tolerated; UE ranger and airdyne-( 120 rpm)      Patient will benefit from skilled therapeutic intervention in order to improve the following deficits and impairments:  Impaired flexibility, Decreased strength, Decreased range of motion, Decreased activity tolerance  Visit Diagnosis: Muscle weakness (generalized)  Stiffness of right shoulder, not elsewhere classified  Pain in right shoulder     Problem List Patient Active Problem List   Diagnosis Date Noted  . Fibrocystic breast 02/24/2014    Dorene Ar, PTA 05/15/2016, 5:07 PM  [facility] [address] [place], [place], [address] Phone: [phone]   Fax:  [phone]  Name: Abigail Moody MRN: [id number] Date of Birth: 01-30-1946

## 2016-05-15 NOTE — Therapy (Signed)
[place] MAIN [facility] SERVICES [address] [place], [place], [address] Phone: [phone]   Fax:  [phone]  Physical Therapy Treatment  Patient Details  Name: Abigail Moody MRN: [id number] Date of Birth: March 16, 1946 Referring Provider: Netta Cedars  Encounter Date: 05/06/2016      PT End of Session - 05/15/16 1449    Visit Number 15   Number of Visits 25   Date for PT Re-Evaluation 06/10/16   PT Start Time 1645   PT Stop Time 1730   PT Time Calculation (min) 45 min   Equipment Utilized During Treatment Other (comment)   Activity Tolerance Patient limited by pain   Behavior During Therapy [facility] for tasks assessed/performed      Past Medical History  Diagnosis Date  . Hypertension   . Diffuse cystic mastopathy   . GERD (gastroesophageal reflux disease)   . Hemorrhoid     Past Surgical History  Procedure Laterality Date  . Carpal tunnel release Bilateral   . Ankle surgery Right 2006  . Mouth surgery  2010  . Toe surgery Left 2006  . Breast biopsy Bilateral   . Tonsillectomy  1959  . Colonoscopy  2008    Dr. Epifanio Lesches  . Trigger finger release Right 2013-2014?    There were no vitals filed for this visit.      Subjective Assessment - 05/15/16 1448    Subjective pt reports her shoulder is not as sore as it was last week. 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 Yes   Pain Score 4    Pain Location Shoulder   Pain Orientation Right   Pain Onset More than a month ago      Therex:   UE ranger RUE in sitting: Flexion/extension x10 ABCs x10 Horizontal abduction/adduction x10  Standing with UE ranger RUE: Flexion/extension 2 x5 with min A  Circles x5 With min VCs to avoid shoulder shrug  IProne: RUE shoulder extension AROM 2x10 RUE low row AROM 2x10 RUE mid row AAROM 2x10 Patient  required min VCs for increasing scapular retraction avoid shoulder shrug Left sidelying: RUE shoulder ER 2x10 AROM with min VCs to reduce scapular protraction/elevation  Supine: AAROM into shoulder flexion with and without assist to keep arm at 90 deg flexion. 10 s x 6 unsupported    Finished with cryotherapy to right shoulder in sitting x5 min Lysle Morales)                              PT Education - 05/15/16 1449    Education provided Yes   Education Details being self aware of shoulder shrug and try to correct it   Person(s) Educated Patient   Methods Explanation   Comprehension Verbalized understanding;Verbal cues required;Tactile cues required             PT Long Term Goals - 04/01/16 1803    PT LONG TERM GOAL #1   Title Patient will improve shoulder AROM to > 140 degrees of flexion, scaption, and abduction for improved ability to perform overhead activities   Time 12   Period Weeks   Status On-going   PT LONG TERM GOAL #2   Title Patient will demonstrate adequate shoulder ROM and strength to be able to shave and dress  independently with pain less than 3/10   Time 12   Period Weeks   Status On-going   PT LONG TERM GOAL #3   Title R shoulder flexion AROM will iprove to 140 degrees in order to 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    Time 12   Period Weeks   Status On-going   PT LONG TERM GOAL #4   Title Pt pain will be <2/10 throughout day while participating in ADLs and work by 05/04/16.    Time 12   Period Weeks   Status On-going               Plan - 05/15/16 1450    Clinical Impression Statement pt still struggling to move her arm against gravity AAROM. progressed shoulder stability with her holding her arm at 90 deg flexion without assist, hopefully to progress to rhythmic stabilization. pt does well with verbal/tactile cues for scapular retraction.    Rehab Potential Good   PT Frequency 2x / week   PT  Duration 12 weeks   PT Treatment/Interventions Electrical Stimulation;Cryotherapy;Moist Heat;Ultrasound;Manual techniques;Therapeutic activities;Therapeutic exercise   PT Next Visit Plan AAROM/strengthening as tolerated;    PT Home Exercise Plan advanced- see patient instructions;    Consulted and Agree with Plan of Care Patient      Patient will benefit from skilled therapeutic intervention in order to improve the following deficits and impairments:  Impaired flexibility, Decreased strength, Decreased range of motion, Decreased activity tolerance  Visit Diagnosis: Muscle weakness (generalized)     Problem List Patient Active Problem List   Diagnosis Date Noted  . Fibrocystic breast 02/24/2014   Gorden Harms. Maebry Obrien, PT, DPT [phone]  Maxson Oddo 05/15/2016, 2:51 PM  [place] MAIN [facility] SERVICES [address] [place], [place], [address] Phone: [phone]   Fax:  [phone]  Name: Abigail Moody MRN: [id number] Date of Birth: 10-16-1946

## 2016-05-20 ENCOUNTER — Encounter: Payer: PRIVATE HEALTH INSURANCE | Admitting: Physical Therapy

## 2016-05-21 ENCOUNTER — Ambulatory Visit: Payer: PRIVATE HEALTH INSURANCE | Admitting: Physical Therapy

## 2016-05-21 ENCOUNTER — Encounter: Payer: Self-pay | Admitting: Physical Therapy

## 2016-05-21 DIAGNOSIS — M25511 Pain in right shoulder: Secondary | ICD-10-CM

## 2016-05-21 DIAGNOSIS — M6281 Muscle weakness (generalized): Secondary | ICD-10-CM | POA: Diagnosis not present

## 2016-05-21 DIAGNOSIS — M25611 Stiffness of right shoulder, not elsewhere classified: Secondary | ICD-10-CM

## 2016-05-21 NOTE — Therapy (Signed)
[place], [place], [address] Phone: [phone]   Fax:  [phone]  Physical Therapy Treatment  Patient Details  Name: KRISY DIX MRN: [id number] Date of Birth: [birth year] Referring Provider: Netta Cedars  Encounter Date: 05/21/2016      PT End of Session - 05/21/16 1641    Visit Number 17   Number of Visits 25   Date for PT Re-Evaluation 06/10/16   PT Start Time 4097   PT Stop Time 1733   PT Time Calculation (min) 58 min   Activity Tolerance Patient limited by pain   Behavior During Therapy [facility] for tasks assessed/performed      Past Medical History  Diagnosis Date  . Hypertension   . Diffuse cystic mastopathy   . GERD (gastroesophageal reflux disease)   . Hemorrhoid     Past Surgical History  Procedure Laterality Date  . Carpal tunnel release Bilateral   . Ankle surgery Right 2006  . Mouth surgery  2010  . Toe surgery Left 2006  . Breast biopsy Bilateral   . Tonsillectomy  1959  . Colonoscopy  2008    Dr. Epifanio Lesches  . Trigger finger release Right 2013-2014?    There were no vitals filed for this visit.      Subjective Assessment - 05/21/16 1639    Subjective Pt reports she is moving back to full duty at work tomorrow. Has been sore. Bra strap has been bothering her shoulder.    Currently in Pain? Yes   Pain Score 3    Pain Location Shoulder   Pain Orientation Right   Pain Descriptors / Indicators Sore   Aggravating Factors  reaching OH, self care   Pain Relieving Factors ice                         [facility] Adult PT Treatment/Exercise - 05/21/16 0001    Shoulder Exercises: Supine   Flexion Limitations AAROM x10   Other Supine Exercises isometric holds within ROM (placed and guarded by PT) 10X5S   Shoulder Exercises: Seated   Retraction 15 reps   External Rotation Limitations no resistance x20   Flexion Limitations 0lb in mirror, AROM with good form  x20   ABduction Limitations X20 in mirror   Shoulder Exercises: Pulleys   Flexion Other (comment)  5 min   Modalities   Modalities Moist Heat   Moist Heat Therapy   Number Minutes Moist Heat 10 Minutes   Moist Heat Location Shoulder;Lumbar Spine   Manual Therapy   Passive ROM flx, abd, IR, ER, ischemic release RUT, trigger point release R pec minor                PT Education - 05/21/16 1640    Education provided Yes   Education Details exercise form/rationale, progression to strength   Person(s) Educated Patient   Methods Explanation;Demonstration;Tactile cues;Verbal cues   Comprehension Verbalized understanding;Returned demonstration;Tactile cues required;Verbal cues required;Need further instruction          PT Short Term Goals - 02/04/16 1018    PT SHORT TERM GOAL #1   Title Pt will be I with initial HEP for continued strengthening and mobility by 02/09/16.    Baseline understands   Time 4   Period Weeks   Status Achieved   PT SHORT TERM GOAL #2   Title R shoulder AROM will improve to 90 degrees pain-free for improved overhead reaching activities by  02/09/16   Baseline 155  3/10 pain      Time 4   Period Weeks   Status Partially Met   PT SHORT TERM GOAL #3   Title Pt will be able to demo and verbalize proper posture as it related to upper body, shoulder, and cervical spine by 02/09/16.    Baseline able to verbalize and demonstrate   Time 4   Period Weeks   Status Achieved           PT Long Term Goals - 05/15/16 1700    PT LONG TERM GOAL #1   Title Patient will improve shoulder AROM to > 140 degrees of flexion, scaption, and abduction for improved ability to perform overhead activities   Time 12   Period Weeks   Status On-going   PT LONG TERM GOAL #2   Title Patient will demonstrate adequate shoulder ROM and strength to be able to shave and dress independently with pain less than 3/10   Period Weeks   Status On-going   PT LONG TERM GOAL #3   Title  R shoulder flexion AROM will iprove to 140 degrees in order to 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    Time 12   Period Weeks   Status On-going   PT LONG TERM GOAL #4   Title Pt pain will be <2/10 throughout day while participating in ADLs and work by 05/04/16.    Time 12   Period Weeks   Status On-going               Plan - 05/21/16 1648    Clinical Impression Statement Move into strength phase per MD order. Strengthening focusing on moving against gravity in an appropriate motion to decrease shoulder hike and pain. Mirror utilized for visual cues as well as tactile cuing from PT. Will continue to benefit from skilled PT in order to return GHJ strength and ROM to functional levels.    PT Treatment/Interventions Electrical Stimulation;Cryotherapy;Moist Heat;Ultrasound;Manual techniques;Therapeutic activities;Therapeutic exercise   PT Next Visit Plan AAROM/strengthening as tolerated; UE ranger and airdyne-( 120 rpm), may try KT tape for pain   PT Home Exercise Plan advanced- see patient instructions;    Consulted and Agree with Plan of Care Patient      Patient will benefit from skilled therapeutic intervention in order to improve the following deficits and impairments:  Impaired flexibility, Decreased strength, Decreased range of motion, Decreased activity tolerance  Visit Diagnosis: Muscle weakness (generalized)  Stiffness of right shoulder, not elsewhere classified  Pain in right shoulder     Problem List Patient Active Problem List   Diagnosis Date Noted  . Fibrocystic breast 02/24/2014    Miles Leyda C. Adaia Matthies PT, DPT 05/21/2016 5:30 PM   [place], [place], [address] Phone: [phone]   Fax:  [phone]  Name: LYNDI HOLBEIN MRN: [id number] Date of Birth: [birth year]

## 2016-05-22 ENCOUNTER — Encounter: Payer: PRIVATE HEALTH INSURANCE | Admitting: Physical Therapy

## 2016-05-26 ENCOUNTER — Encounter: Payer: Self-pay | Admitting: Physical Therapy

## 2016-05-26 ENCOUNTER — Ambulatory Visit: Payer: PRIVATE HEALTH INSURANCE | Attending: Orthopedic Surgery | Admitting: Physical Therapy

## 2016-05-26 ENCOUNTER — Encounter: Payer: PRIVATE HEALTH INSURANCE | Admitting: Physical Therapy

## 2016-05-26 DIAGNOSIS — M25511 Pain in right shoulder: Secondary | ICD-10-CM | POA: Diagnosis present

## 2016-05-26 DIAGNOSIS — M25611 Stiffness of right shoulder, not elsewhere classified: Secondary | ICD-10-CM | POA: Insufficient documentation

## 2016-05-26 DIAGNOSIS — M6281 Muscle weakness (generalized): Secondary | ICD-10-CM | POA: Insufficient documentation

## 2016-05-26 DIAGNOSIS — M75101 Unspecified rotator cuff tear or rupture of right shoulder, not specified as traumatic: Secondary | ICD-10-CM | POA: Diagnosis present

## 2016-05-26 NOTE — Therapy (Signed)
[place], [place], [address] Phone: [phone]   Fax:  [phone]  Physical Therapy Treatment  Patient Details  Name: Abigail Moody MRN: [id number] Date of Birth: 21-Aug-1946 Referring Provider: Netta Cedars  Encounter Date: 05/26/2016      PT End of Session - 05/26/16 1721    Visit Number 18   Number of Visits 25   Date for PT Re-Evaluation 06/10/16   PT Start Time 1632   PT Stop Time 1726   PT Time Calculation (min) 54 min   Activity Tolerance Patient tolerated treatment well   Behavior During Therapy [facility] for tasks assessed/performed      Past Medical History  Diagnosis Date  . Hypertension   . Diffuse cystic mastopathy   . GERD (gastroesophageal reflux disease)   . Hemorrhoid     Past Surgical History  Procedure Laterality Date  . Carpal tunnel release Bilateral   . Ankle surgery Right 2006  . Mouth surgery  2010  . Toe surgery Left 2006  . Breast biopsy Bilateral   . Tonsillectomy  1959  . Colonoscopy  2008    Dr. Epifanio Lesches  . Trigger finger release Right 2013-2014?    There were no vitals filed for this visit.      Subjective Assessment - 05/26/16 1634    Subjective uses a pillow at work to support arm. feels that trigger point release last visit was helpful. is trying to move arm at home.    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 Yes   Pain Score 4    Pain Location Shoulder   Pain Orientation Right   Pain Descriptors / Indicators Aching   Aggravating Factors  reaching, using arm, hanging without support                         [facility] Adult PT Treatment/Exercise - 05/26/16 0001    Shoulder Exercises: Supine   External Rotation 20 reps   Theraband Level (Shoulder External Rotation) Level 1 (Yellow)   Flexion Limitations AAROM x10   Other Supine Exercises isometric holds within ROM (placed and guarded by PT)  10X5S   Shoulder Exercises: Seated   Abduction 20 reps   ABduction Weight (lbs) 0   ABduction Limitations elbows flexed   Other Seated Exercises unweight biceps curls x20   Shoulder Exercises: Prone   Other Prone Exercises CKC rocking, feet on floor hands on table   Shoulder Exercises: Standing   Other Standing Exercises triceps kick 0lb x20   Electrical Stimulation   Electrical Stimulation Location Shoulder   Electrical Stimulation Action IFC   Electrical Stimulation Parameters 10 min L8   Electrical Stimulation Goals Pain   Manual Therapy   Passive ROM flx, ABD ER stretching; inf gr4 mobs end range abd                PT Education - 05/26/16 1720    Education provided Yes   Education Details exercise form/rationale   Person(s) Educated Patient   Methods Explanation;Demonstration;Tactile cues;Verbal cues   Comprehension Verbalized understanding;Returned demonstration;Verbal cues required;Tactile cues required;Need further instruction          PT Short Term Goals - 02/04/16 1018    PT SHORT TERM GOAL #1   Title Pt will be I with initial HEP for continued strengthening and mobility by 02/09/16.    Baseline understands   Time  4   Period Weeks   Status Achieved   PT SHORT TERM GOAL #2   Title R shoulder AROM will improve to 90 degrees pain-free for improved overhead reaching activities by 02/09/16   Baseline 155  3/10 pain      Time 4   Period Weeks   Status Partially Met   PT SHORT TERM GOAL #3   Title Pt will be able to demo and verbalize proper posture as it related to upper body, shoulder, and cervical spine by 02/09/16.    Baseline able to verbalize and demonstrate   Time 4   Period Weeks   Status Achieved           PT Long Term Goals - 05/15/16 1700    PT LONG TERM GOAL #1   Title Patient will improve shoulder AROM to > 140 degrees of flexion, scaption, and abduction for improved ability to perform overhead activities   Time 12   Period Weeks    Status On-going   PT LONG TERM GOAL #2   Title Patient will demonstrate adequate shoulder ROM and strength to be able to shave and dress independently with pain less than 3/10   Period Weeks   Status On-going   PT LONG TERM GOAL #3   Title R shoulder flexion AROM will iprove to 140 degrees in order to 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    Time 12   Period Weeks   Status On-going   PT LONG TERM GOAL #4   Title Pt pain will be <2/10 throughout day while participating in ADLs and work by 05/04/16.    Time 12   Period Weeks   Status On-going               Plan - 05/26/16 1722    Clinical Impression Statement Incorporated strengthening exercises today within available ROM without shoulder hike. Discomfort noted in biceps tendon.    PT Treatment/Interventions Electrical Stimulation;Cryotherapy;Moist Heat;Ultrasound;Manual techniques;Therapeutic activities;Therapeutic exercise   PT Next Visit Plan AAROM/strengthening as tolerated; UE ranger and airdyne-( 120 rpm), may try KT tape for pain   PT Home Exercise Plan advanced- see patient instructions;       Patient will benefit from skilled therapeutic intervention in order to improve the following deficits and impairments:  Impaired flexibility, Decreased strength, Decreased range of motion, Decreased activity tolerance  Visit Diagnosis: Muscle weakness (generalized)  Stiffness of right shoulder, not elsewhere classified  Pain in right shoulder     Problem List Patient Active Problem List   Diagnosis Date Noted  . Fibrocystic breast 02/24/2014    Jessilynn Taft C. Mabell Esguerra PT, DPT 05/26/2016 5:24 PM   [place] [facility] [address] [place], [place], [address] Phone: [phone]   Fax:  [phone]  Name: Abigail Moody MRN: [id number] Date of Birth: 10-Aug-1946

## 2016-05-27 DIAGNOSIS — H2513 Age-related nuclear cataract, bilateral: Secondary | ICD-10-CM | POA: Diagnosis not present

## 2016-05-29 ENCOUNTER — Ambulatory Visit: Payer: PRIVATE HEALTH INSURANCE | Admitting: Physical Therapy

## 2016-05-29 DIAGNOSIS — M25511 Pain in right shoulder: Secondary | ICD-10-CM

## 2016-05-29 DIAGNOSIS — M6281 Muscle weakness (generalized): Secondary | ICD-10-CM

## 2016-05-29 DIAGNOSIS — M25611 Stiffness of right shoulder, not elsewhere classified: Secondary | ICD-10-CM

## 2016-05-29 NOTE — Patient Instructions (Signed)
External Rotation (Eccentric), (Resistance Band)    pull band outward with forearm of affected arm. Keep elbows at sides, wrists neutral. Slowly return to start for 3-5 seconds. Use ___Yellow_____ resistance band. Hint: Use towel roll between elbow and hip. __20_ reps per set, __2_ sets per day, __7_ days per week.

## 2016-05-30 NOTE — Therapy (Signed)
[place], [place], [address] Phone: [phone]   Fax:  [phone]  Physical Therapy Treatment  Patient Details  Name: Abigail Moody MRN: [id number] Date of Birth: [birth year] Referring Provider: Netta Cedars  Encounter Date: 05/29/2016      PT End of Session - 05/29/16 1035    Visit Number 19   Number of Visits 25   Date for PT Re-Evaluation June 12, 2016   Authorization Type g codes   PT Start Time 0430   PT Stop Time 9563   PT Time Calculation (min) 65 min      Past Medical History  Diagnosis Date  . Hypertension   . Diffuse cystic mastopathy   . GERD (gastroesophageal reflux disease)   . Hemorrhoid     Past Surgical History  Procedure Laterality Date  . Carpal tunnel release Bilateral   . Ankle surgery Right 2006  . Mouth surgery  2010  . Toe surgery Left 2006  . Breast biopsy Bilateral   . Tonsillectomy  1959  . Colonoscopy  2008    Dr. Epifanio Lesches  . Trigger finger release Right 2013-2014?    There were no vitals filed for this visit.      Subjective Assessment - 05/29/16 1637    Subjective Just sore   Currently in Pain? No/denies            [facility] PT Assessment - 05/30/16 0001    AROM   Right Shoulder Flexion 70 Degrees   Right Shoulder ABduction 60 Degrees                     [facility] Adult PT Treatment/Exercise - 05/30/16 0001    Shoulder Exercises: Supine   Protraction 10 reps   External Rotation 20 reps   Theraband Level (Shoulder External Rotation) Level 1 (Yellow)   Flexion Limitations AAROM x10  UE ranger supine   Other Supine Exercises supine clasped hands pullovers and chest press x 10 each    Other Supine Exercises supine starting in 90 degrees flexion, small unassisted movements 12-6 oclock, then 9-3 oclock x 10 each   Shoulder Exercises: Standing   Other Standing Exercises UE ranger from floor.  and from 6 inch step to 90 degrees then  horizontal abdct and addct at 90 flexion, then on wall for flexion level 2    Shoulder Exercises: ROM/Strengthening   UBE (Upper Arm Bike) UBE Level  1.0    Moist Heat Therapy   Number Minutes Moist Heat 15 Minutes   Moist Heat Location Shoulder;Lumbar Spine   Electrical Stimulation   Electrical Stimulation Location Shoulder   Electrical Stimulation Action IFCx 15 min   Electrical Stimulation Parameters to tolerance   Electrical Stimulation Goals Pain                PT Education - 05/29/16 1714    Education provided Yes   Education Details ER yellow band bilateral    Person(s) Educated Patient   Methods Explanation;Handout   Comprehension Verbalized understanding          PT Short Term Goals - 02/04/16 1018    PT SHORT TERM GOAL #1   Title Pt will be I with initial HEP for continued strengthening and mobility by 02/09/16.    Baseline understands   Time 4   Period Weeks   Status Achieved   PT SHORT TERM GOAL #2   Title R shoulder AROM will improve to 90 degrees  pain-free for improved overhead reaching activities by 02/09/16   Baseline 155  3/10 pain      Time 4   Period Weeks   Status Partially Met   PT SHORT TERM GOAL #3   Title Pt will be able to demo and verbalize proper posture as it related to upper body, shoulder, and cervical spine by 02/09/16.    Baseline able to verbalize and demonstrate   Time 4   Period Weeks   Status Achieved           PT Long Term Goals - 05/15/16 1700    PT LONG TERM GOAL #1   Title Patient will improve shoulder AROM to > 140 degrees of flexion, scaption, and abduction for improved ability to perform overhead activities   Time 12   Period Weeks   Status On-going   PT LONG TERM GOAL #2   Title Patient will demonstrate adequate shoulder ROM and strength to be able to shave and dress independently with pain less than 3/10   Period Weeks   Status On-going   PT LONG TERM GOAL #3   Title R shoulder flexion AROM will iprove to  140 degrees in order to 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    Time 12   Period Weeks   Status On-going   PT LONG TERM GOAL #4   Title Pt pain will be <2/10 throughout day while participating in ADLs and work by 05/04/16.    Time 12   Period Weeks   Status On-going               Plan - 05/29/16 1643    Clinical Impression Statement Pt reports improvement in pain follwoing the manual treatments over the last two visits. Instructed pt in gentle AAROM and strengthening in pain free ROM. Repeated IFC/ HMP as pt reports she is helpful. Shoulder flexion and abduction improved however still limited. Progressing toward AROM goals.    PT Next Visit Plan AAROM/strengthening as tolerated, painfree      Patient will benefit from skilled therapeutic intervention in order to improve the following deficits and impairments:  Impaired flexibility, Decreased strength, Decreased range of motion, Decreased activity tolerance  Visit Diagnosis: Muscle weakness (generalized)  Stiffness of right shoulder, not elsewhere classified  Pain in right shoulder     Problem List Patient Active Problem List   Diagnosis Date Noted  . Fibrocystic breast 02/24/2014    Dorene Ar, PTA 05/30/2016, 10:43 AM  [facility] [address] [place], [place], [address] Phone: [phone]   Fax:  [phone]  Name: Abigail Moody MRN: [id number] Date of Birth: 1946/02/24

## 2016-06-02 ENCOUNTER — Encounter: Payer: Self-pay | Admitting: Physical Therapy

## 2016-06-02 ENCOUNTER — Ambulatory Visit: Payer: PRIVATE HEALTH INSURANCE | Admitting: Physical Therapy

## 2016-06-02 DIAGNOSIS — M6281 Muscle weakness (generalized): Secondary | ICD-10-CM

## 2016-06-02 DIAGNOSIS — M25511 Pain in right shoulder: Secondary | ICD-10-CM

## 2016-06-02 NOTE — Therapy (Signed)
[place], [place], [address] Phone: [phone]   Fax:  [phone]  Physical Therapy Treatment  Patient Details  Name: Abigail Moody MRN: [id number] Date of Birth: 1946-06-18 Referring Provider: Netta Cedars  Encounter Date: 06/02/2016      PT End of Session - 06/02/16 1731    Visit Number 20   Number of Visits 25   Date for PT Re-Evaluation 06-22-2016   Authorization Type g codes   PT Start Time 2536   PT Stop Time 1736   PT Time Calculation (min) 65 min   Activity Tolerance Patient limited by pain   Behavior During Therapy [facility] for tasks assessed/performed      Past Medical History  Diagnosis Date  . Hypertension   . Diffuse cystic mastopathy   . GERD (gastroesophageal reflux disease)   . Hemorrhoid     Past Surgical History  Procedure Laterality Date  . Carpal tunnel release Bilateral   . Ankle surgery Right 2006  . Mouth surgery  2010  . Toe surgery Left 2006  . Breast biopsy Bilateral   . Tonsillectomy  1959  . Colonoscopy  2008    Dr. Epifanio Lesches  . Trigger finger release Right 2013-2014?    There were no vitals filed for this visit.      Subjective Assessment - 06/02/16 1638    Subjective pt woke this morning feeling sick, similar to allergies. 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 Yes   Pain Score 2    Pain Location Shoulder   Pain Orientation Right   Pain Descriptors / Indicators Sore   Aggravating Factors  reaching to use arm   Pain Relieving Factors rest            [facility] PT Assessment - 06/02/16 0001    PROM   Right Shoulder Flexion 121 Degrees                     [facility] Adult PT Treatment/Exercise - 06/02/16 0001    Shoulder Exercises: Supine   External Rotation 20 reps   Theraband Level (Shoulder External Rotation) Level 1 (Yellow)   Internal Rotation 20 reps   Internal Rotation Weight (lbs) resisted by PT   Internal Rotation Limitations 45 deg abd, within available ROM   Other Supine Exercises supine clasped hands pullovers and chest press x 10 each    Other Supine Exercises iso holds 90, 80, 70 60, 50 5s ea   Shoulder Exercises: Sidelying   ABduction 20 reps   ABduction Weight (lbs) 0lb   ABduction Limitations to 90deg   Shoulder Exercises: Standing   ABduction Limitations hand to wall at 75 deg abd, mini elbow flx   Shoulder Exercises: ROM/Strengthening   Other ROM/Strengthening Exercises supine isometric extension 10x5s holds   Moist Heat Therapy   Number Minutes Moist Heat 15 Minutes  concurrent with ESTIM   Moist Heat Location Shoulder   Electrical Stimulation   Electrical Stimulation Location Shoulder   Electrical Stimulation Action IFC 15 min   Electrical Stimulation Parameters to tol   Electrical Stimulation Goals Pain   Manual Therapy   Passive ROM flx, ER, abd; STM/TPR R UT, pec minor, ischemic release                PT Education - 06/02/16 1733  Education provided Yes   Education Details exercise form/rationale, progression, HEP, pinching sensation at [facility] joint   Person(s) Educated Patient   Methods Explanation;Demonstration;Tactile cues;Verbal cues;Handout   Comprehension Verbalized understanding;Returned demonstration;Verbal cues required;Tactile cues required;Need further instruction          PT Short Term Goals - 02/04/16 1018    PT SHORT TERM GOAL #1   Title Pt will be I with initial HEP for continued strengthening and mobility by 02/09/16.    Baseline understands   Time 4   Period Weeks   Status Achieved   PT SHORT TERM GOAL #2   Title R shoulder AROM will improve to 90 degrees pain-free for improved overhead reaching activities by 02/09/16   Baseline 155  3/10 pain      Time 4   Period Weeks   Status Partially Met   PT SHORT TERM GOAL #3   Title Pt will be able to demo  and verbalize proper posture as it related to upper body, shoulder, and cervical spine by 02/09/16.    Baseline able to verbalize and demonstrate   Time 4   Period Weeks   Status Achieved           PT Long Term Goals - 05/15/16 1700    PT LONG TERM GOAL #1   Title Patient will improve shoulder AROM to > 140 degrees of flexion, scaption, and abduction for improved ability to perform overhead activities   Time 12   Period Weeks   Status On-going   PT LONG TERM GOAL #2   Title Patient will demonstrate adequate shoulder ROM and strength to be able to shave and dress independently with pain less than 3/10   Period Weeks   Status On-going   PT LONG TERM GOAL #3   Title R shoulder flexion AROM will iprove to 140 degrees in order to 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    Time 12   Period Weeks   Status On-going   PT LONG TERM GOAL #4   Title Pt pain will be <2/10 throughout day while participating in ADLs and work by 05/04/16.    Time 12   Period Weeks   Status On-going               Plan - 06/02/16 1651    Clinical Impression Statement Pt cont to complain of pinching sensation at [facility] joint at end range flexion both actively and passively. was provided with HEP to improve periscapular strength and lengthen upper traps.    PT Treatment/Interventions Electrical Stimulation;Cryotherapy;Moist Heat;Ultrasound;Manual techniques;Therapeutic activities;Therapeutic exercise   PT Next Visit Plan update goals, re-evaluation   PT Home Exercise Plan advanced- see patient instructions;    Consulted and Agree with Plan of Care Patient      Patient will benefit from skilled therapeutic intervention in order to improve the following deficits and impairments:     Visit Diagnosis: Muscle weakness (generalized)  Pain in right shoulder     Problem List Patient Active Problem List   Diagnosis Date Noted  . Fibrocystic breast 02/24/2014   Jahlen Bollman C.  Meeghan Skipper PT, DPT 06/02/2016 5:46 PM   [place], [place], [address] Phone: [phone]   Fax:  [phone]  Name: Abigail Moody MRN: [id number] Date of Birth: 1946/11/13

## 2016-06-05 ENCOUNTER — Ambulatory Visit: Payer: PRIVATE HEALTH INSURANCE | Admitting: Physical Therapy

## 2016-06-05 DIAGNOSIS — M75101 Unspecified rotator cuff tear or rupture of right shoulder, not specified as traumatic: Secondary | ICD-10-CM

## 2016-06-05 DIAGNOSIS — M6281 Muscle weakness (generalized): Secondary | ICD-10-CM

## 2016-06-05 DIAGNOSIS — M25611 Stiffness of right shoulder, not elsewhere classified: Secondary | ICD-10-CM

## 2016-06-05 DIAGNOSIS — M25511 Pain in right shoulder: Secondary | ICD-10-CM

## 2016-06-05 NOTE — Patient Instructions (Signed)
Continue exercises if you have to cancel 1 appointment next week.

## 2016-06-05 NOTE — Therapy (Signed)
Abigail Moody, [place], [address] Phone: [phone]   Fax:  [phone]  Physical Therapy Treatment  Patient Details  Name: Abigail Moody MRN: [id number] Date of Birth: January 28, 1946 Referring Provider: Netta Cedars  Encounter Date: 06/05/2016      PT End of Session - 06/05/16 1743    Visit Number 21   Number of Visits 25   Date for PT Re-Evaluation 06/10/16   PT Start Time 1635   PT Stop Time 1733   PT Time Calculation (min) 58 min   Activity Tolerance Patient tolerated treatment well   Behavior During Therapy [facility] for tasks assessed/performed      Past Medical History  Diagnosis Date  . Hypertension   . Diffuse cystic mastopathy   . GERD (gastroesophageal reflux disease)   . Hemorrhoid     Past Surgical History  Procedure Laterality Date  . Carpal tunnel release Bilateral   . Ankle surgery Right 2006  . Mouth surgery  2010  . Toe surgery Left 2006  . Breast biopsy Bilateral   . Tonsillectomy  1959  . Colonoscopy  2008    Dr. Epifanio Lesches  . Trigger finger release Right 2013-2014?    There were no vitals filed for this visit.      Subjective Assessment - 06/05/16 1728    Currently in Pain? Yes   Pain Score 2    Pain Location Shoulder   Pain Orientation Right   Pain Descriptors / Indicators Sore   Pain Frequency Intermittent   Aggravating Factors  reaching   Pain Relieving Factors rest,  use pillow for support at work                         [facility] Adult PT Treatment/Exercise - 06/05/16 0001    Shoulder Exercises: Supine   Flexion 10 reps  from 90.   Other Supine Exercises serratus punch 10 x 2 AA   Other Supine Exercises iosmetric holds 3 positions 3 X each   Shoulder Exercises: Seated   ABduction Limitations deltiod strengthening with elbow flexed 10 x flexion,  abduction,  circles 10 x each,  AA at times   Other Seated Exercises AROM, AAROMshoulder flexion,   abduction multiple reps multiple tasks.     Shoulder Exercises: Sidelying   External Rotation 10 reps  using towel roll   Flexion 10 reps  2 sets with UE Ranger, AA multiple angles,  scapular, trunk    ABduction 10 reps  AAROM   Shoulder Exercises: Pulleys   Flexion 1 minute   Moist Heat Therapy   Number Minutes Moist Heat 15 Minutes   Moist Heat Location Shoulder   Manual Therapy   Soft tissue mobilization soft tissue work  Teres sensitive,  scar tissue mobilization                  PT Short Term Goals - 06/05/16 1749    PT SHORT TERM GOAL #1   Title Pt will be I with initial HEP for continued strengthening and mobility by 02/09/16.    Time 4   Period Weeks   Status Achieved   PT SHORT TERM GOAL #2   Title R shoulder AROM will improve to 90 degrees pain-free for improved overhead reaching activities by 02/09/16   Time 4   Period Weeks   Status Unable to assess   PT SHORT TERM GOAL #3   Title Pt will be  able to demo and verbalize proper posture as it related to upper body, shoulder, and cervical spine by 02/09/16.    Time 4   Period Weeks   Status Achieved           PT Long Term Goals - 06/05/16 1750    PT LONG TERM GOAL #1   Title Patient will improve shoulder AROM to > 140 degrees of flexion, scaption, and abduction for improved ability to perform overhead activities   Time 12   Period Weeks   Status On-going   PT LONG TERM GOAL #2   Title Patient will demonstrate adequate shoulder ROM and strength to be able to shave and dress independently with pain less than 3/10   Baseline pain 4-5/10 with dressing, moving arm,  compensations needed   Time 12   Period Weeks   Status On-going   PT LONG TERM GOAL #3   Title R shoulder flexion AROM will iprove to 140 degrees in order to 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    Time 12   Period Weeks   Status On-going   PT LONG TERM GOAL #4   Title Pt pain will be <2/10 throughout day while  participating in ADLs and work by 05/04/16.    Baseline 4/10 today   Time 12   Period Weeks   Status On-going               Plan - 06/05/16 1744    Clinical Impression Statement Patient is afraid her shoulder is going to rip again.  She felt better after explanation of things like falling will rip muscles not usually things like lifting her arm.She will likely cancel 1 appointment next week for personal reasons. 4/10 pain post session   PT Next Visit Plan re evaluation,    PT Home Exercise Plan continue   Consulted and Agree with Plan of Care Patient      Patient will benefit from skilled therapeutic intervention in order to improve the following deficits and impairments:  Impaired flexibility, Decreased strength, Decreased range of motion, Decreased activity tolerance  Visit Diagnosis: Muscle weakness (generalized)  Pain in right shoulder  RCT (rotator cuff tear), right  Stiffness of right shoulder, not elsewhere classified     Problem List Patient Active Problem List   Diagnosis Date Noted  . Fibrocystic breast 02/24/2014    [facility] 06/05/2016, 5:52 PM  [facility] [address] [place], [place], [address] Phone: [phone]   Fax:  [phone]  Name: Abigail Moody MRN: [id number] Date of Birth: [birth year]    Abigail Moody, PTA 06/05/2016 5:52 PM Phone: [phone] Fax: [phone]

## 2016-06-10 ENCOUNTER — Ambulatory Visit: Payer: PRIVATE HEALTH INSURANCE | Admitting: Physical Therapy

## 2016-06-10 ENCOUNTER — Encounter: Payer: Self-pay | Admitting: Physical Therapy

## 2016-06-10 DIAGNOSIS — M6281 Muscle weakness (generalized): Secondary | ICD-10-CM

## 2016-06-10 DIAGNOSIS — M25511 Pain in right shoulder: Secondary | ICD-10-CM

## 2016-06-10 NOTE — Therapy (Signed)
[place], [place], [address] Phone: [phone]   Fax:  [phone]  Physical Therapy Treatment  Patient Details  Name: Abigail Moody MRN: [id number] Date of Birth: 04/11/1946 Referring Provider: Netta Cedars  Encounter Date: 06-16-2016      PT End of Session - 16-Jun-2016 1637    Visit Number 22   Number of Visits 25   Date for PT Re-Evaluation 06/16/16   Authorization Type g codes   PT Start Time 8828   PT Stop Time 0034   PT Time Calculation (min) 63 min   Activity Tolerance Patient tolerated treatment well   Behavior During Therapy [facility] for tasks assessed/performed      Past Medical History  Diagnosis Date  . Hypertension   . Diffuse cystic mastopathy   . GERD (gastroesophageal reflux disease)   . Hemorrhoid     Past Surgical History  Procedure Laterality Date  . Carpal tunnel release Bilateral   . Ankle surgery Right 2006  . Mouth surgery  2010  . Toe surgery Left 2006  . Breast biopsy Bilateral   . Tonsillectomy  1959  . Colonoscopy  2008    Dr. Epifanio Lesches  . Trigger finger release Right 2013-2014?    There were no vitals filed for this visit.      Subjective Assessment - Jun 16, 2016 1636    Subjective pt reports her shoulder is not really bothering her right now. 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 No/denies   Aggravating Factors  reaching, lifting, OH motion   Pain Relieving Factors rest            [facility] PT Assessment - 06/16/16 0001    Observation/Other Assessments   Other Surveys  Other Surveys   Quick DASH  54%   AROM   Right Shoulder Flexion 97 Degrees   Right Shoulder ABduction 68 Degrees   PROM   Right Shoulder Flexion 123 Degrees   Right Shoulder ABduction 80 Degrees   Right Shoulder External Rotation 35 Degrees  at 45 deg abd   Strength   Right Shoulder Flexion 3-/5   Right Shoulder ABduction 3-/5   Right Shoulder Internal Rotation 5/5   Right Shoulder External Rotation 3+/5                     [facility] Adult PT Treatment/Exercise - 06/16/16 0001    Shoulder Exercises: Supine   External Rotation 20 reps   Theraband Level (Shoulder External Rotation) Level 1 (Yellow)   Other Supine Exercises serratus punch 10 x 2 AA   Other Supine Exercises isometric extension 10x10s holds   Shoulder Exercises: Seated   Flexion 20 reps   Theraband Level (Shoulder Flexion) Level 1 (Yellow)   Flexion Limitations combined wth horiz abd pull   Shoulder Exercises: Sidelying   Flexion 10 reps  2 sets with UE Ranger, AA multiple angles,  scapular, trunk    Shoulder Exercises: Standing   ABduction 20 reps   Theraband Level (Shoulder ABduction) Level 1 (Yellow)   ABduction Limitations band behind back   Other Standing Exercises red plyoball CW/CCW circles CKC on table   Other Standing Exercises AROM biceps & triceps x20 ea   Shoulder Exercises: Pulleys   Flexion 3 minutes   Shoulder Exercises: ROM/Strengthening   UBE (Upper Arm Bike) retro  L4   Moist Heat Therapy   Number Minutes Moist Heat 15 Minutes  concurrent with ESTIM   Moist Heat Location Shoulder   Electrical Stimulation   Electrical Stimulation Location Shoulder   Electrical Stimulation Action IFC 15 min   Electrical Stimulation Parameters to tolerance   Electrical Stimulation Goals Pain   Manual Therapy   Passive ROM flexion, abduction, ER                PT Education - 06/10/16 1637    Education provided Yes   Education Details exercise form/rationale   Person(s) Educated Patient   Methods Explanation;Demonstration;Tactile cues;Verbal cues   Comprehension Verbalized understanding;Returned demonstration;Verbal cues required;Tactile cues required;Need further instruction          PT Short Term Goals - 06/05/16 1749    PT SHORT TERM GOAL #1    Title Pt will be I with initial HEP for continued strengthening and mobility by 02/09/16.    Time 4   Period Weeks   Status Achieved   PT SHORT TERM GOAL #2   Title R shoulder AROM will improve to 90 degrees pain-free for improved overhead reaching activities by 02/09/16   Time 4   Period Weeks   Status Unable to assess   PT SHORT TERM GOAL #3   Title Pt will be able to demo and verbalize proper posture as it related to upper body, shoulder, and cervical spine by 02/09/16.    Time 4   Period Weeks   Status Achieved           PT Long Term Goals - 06/05/16 1750    PT LONG TERM GOAL #1   Title Patient will improve shoulder AROM to > 140 degrees of flexion, scaption, and abduction for improved ability to perform overhead activities   Time 12   Period Weeks   Status On-going   PT LONG TERM GOAL #2   Title Patient will demonstrate adequate shoulder ROM and strength to be able to shave and dress independently with pain less than 3/10   Baseline pain 4-5/10 with dressing, moving arm,  compensations needed   Time 12   Period Weeks   Status On-going   PT LONG TERM GOAL #3   Title R shoulder flexion AROM will iprove to 140 degrees in order to 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    Time 12   Period Weeks   Status On-going   PT LONG TERM GOAL #4   Title Pt pain will be <2/10 throughout day while participating in ADLs and work by 05/04/16.    Baseline 4/10 today   Time 12   Period Weeks   Status On-going               Plan - 06/10/16 1723    Clinical Impression Statement 2/10 pain following treatment. pt is demonstrating gradual improvements in strength and function of shoulder, was able to reach for pulley handles using R arm today. will continue to appropraitely strengthen shoulder girdle while training ROM to reach long term functional goals.    Rehab Potential Good   PT Frequency 2x / week   PT Duration 6 weeks   PT Treatment/Interventions  Electrical Stimulation;Cryotherapy;Moist Heat;Ultrasound;Manual techniques;Therapeutic activities;Therapeutic exercise;ADLs/Self Care Home Management;Iontophoresis 35m/ml Dexamethasone;Functional mobility training;Patient/family education;Neuromuscular re-education;Taping;Dry needling;Passive range of motion   PT Next Visit Plan biceps/triceps, trigger point release pec maj and minor; goals to be met by 07/25/2016   PT Home Exercise  Plan continue   Consulted and Agree with Plan of Care Patient      Patient will benefit from skilled therapeutic intervention in order to improve the following deficits and impairments:  Impaired flexibility, Decreased strength, Decreased range of motion, Decreased activity tolerance, Improper body mechanics, Pain  Visit Diagnosis: Muscle weakness (generalized) - Plan: PT plan of care cert/re-cert  Pain in right shoulder - Plan: PT plan of care cert/re-cert     Problem List Patient Active Problem List   Diagnosis Date Noted  . Fibrocystic breast 02/24/2014    Mearl Harewood C. Xuan Mateus PT, DPT 06/10/2016 5:53 PM   [facility] [address] [place], [place], [address] Phone: [phone]   Fax:  [phone]  Name: Abigail Moody MRN: [id number] Date of Birth: 06-16-1946

## 2016-06-12 ENCOUNTER — Ambulatory Visit: Payer: PRIVATE HEALTH INSURANCE | Admitting: Physical Therapy

## 2016-06-12 ENCOUNTER — Encounter: Payer: Self-pay | Admitting: Physical Therapy

## 2016-06-12 DIAGNOSIS — M25511 Pain in right shoulder: Secondary | ICD-10-CM

## 2016-06-12 DIAGNOSIS — M6281 Muscle weakness (generalized): Secondary | ICD-10-CM

## 2016-06-12 NOTE — Therapy (Signed)
[place], [place], [address] Phone: [phone]   Fax:  [phone]  Physical Therapy Treatment  Patient Details  Name: Abigail Moody MRN: [id number] Date of Birth: 07-22-1946 Referring Provider: Netta Cedars  Encounter Date: 06/12/2016      PT End of Session - 06/12/16 1721    Visit Number 23   Number of Visits 25   Date for PT Re-Evaluation 07/03/16   PT Start Time 1630   PT Stop Time 1731   PT Time Calculation (min) 61 min   Activity Tolerance Patient tolerated treatment well   Behavior During Therapy [facility] for tasks assessed/performed      Past Medical History  Diagnosis Date  . Hypertension   . Diffuse cystic mastopathy   . GERD (gastroesophageal reflux disease)   . Hemorrhoid     Past Surgical History  Procedure Laterality Date  . Carpal tunnel release Bilateral   . Ankle surgery Right 2006  . Mouth surgery  2010  . Toe surgery Left 2006  . Breast biopsy Bilateral   . Tonsillectomy  1959  . Colonoscopy  2008    Dr. Epifanio Lesches  . Trigger finger release Right 2013-2014?    There were no vitals filed for this visit.      Subjective Assessment - 06/12/16 1630    Subjective Sharp pain under anterior incision, reports cramping sensation on lateral arm upon lifting "like it just won't let me go any higher".    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 Yes   Pain Score 3    Pain Location Shoulder   Pain Orientation Right   Pain Descriptors / Indicators Sore;Cramping                         [facility] Adult PT Treatment/Exercise - 06/12/16 0001    Shoulder Exercises: Supine   Flexion 10 reps  beg with 90 deg elbow bend   Shoulder Exercises: Seated   Abduction 20 reps   ABduction Limitations in externally rotated position   Shoulder Exercises: Standing   ABduction 20 reps   Theraband Level (Shoulder ABduction) Level 1  (Yellow)   ABduction Limitations band behind back   Shoulder Exercises: Pulleys   Flexion 3 minutes   Shoulder Exercises: ROM/Strengthening   Other ROM/Strengthening Exercises ranger flexion x20, abd x20   Shoulder Exercises: Power [employer] 20 reps  YTB   Other Power [employer] Exercises triceps pulls YTB x20   Water quality scientist Action IFC 15 min   Electrical Stimulation Parameters to tolerance   Electrical Stimulation Goals Pain   Manual Therapy   Soft tissue mobilization 3 incision sites around shoulder; ischemic release R UT                  PT Short Term Goals - 06/05/16 1749    PT SHORT TERM GOAL #1   Title Pt will be I with initial HEP for continued strengthening and mobility by 02/09/16.    Time 4   Period Weeks   Status Achieved   PT SHORT TERM GOAL #2   Title R shoulder AROM will improve to 90 degrees pain-free for improved overhead reaching activities by 02/09/16   Time 4   Period Weeks   Status Unable to assess   PT SHORT TERM GOAL #3   Title  Pt will be able to demo and verbalize proper posture as it related to upper body, shoulder, and cervical spine by 02/09/16.    Time 4   Period Weeks   Status Achieved           PT Long Term Goals - 06/05/16 1750    PT LONG TERM GOAL #1   Title Patient will improve shoulder AROM to > 140 degrees of flexion, scaption, and abduction for improved ability to perform overhead activities   Time 12   Period Weeks   Status On-going   PT LONG TERM GOAL #2   Title Patient will demonstrate adequate shoulder ROM and strength to be able to shave and dress independently with pain less than 3/10   Baseline pain 4-5/10 with dressing, moving arm,  compensations needed   Time 12   Period Weeks   Status On-going   PT LONG TERM GOAL #3   Title R shoulder flexion AROM will iprove to 140 degrees in order to retrun to functional 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    Time 12   Period Weeks   Status On-going   PT LONG TERM GOAL #4   Title Pt pain will be <2/10 throughout day while participating in ADLs and work by 05/04/16.    Baseline 4/10 today   Time 12   Period Weeks   Status On-going               Plan - 06/12/16 1722    Clinical Impression Statement Pt verbalized decreased pain following treatment today, continues to utilize UT in new ranges but is demonstrating improvements.    PT Next Visit Plan biceps/triceps, trigger point release pec maj and minor; goals to be met by 07/25/2016   Consulted and Agree with Plan of Care Patient      Patient will benefit from skilled therapeutic intervention in order to improve the following deficits and impairments:     Visit Diagnosis: Muscle weakness (generalized)  Pain in right shoulder     Problem List Patient Active Problem List   Diagnosis Date Noted  . Fibrocystic breast 02/24/2014  Joey Lierman C. Jandy Brackens PT, DPT 06/12/2016 5:24 PM   [place] [facility] [address] [place], [place], [address] Phone: [phone]   Fax:  [phone]  Name: Abigail Moody MRN: [id number] Date of Birth: [birth year]

## 2016-06-17 ENCOUNTER — Encounter: Payer: Self-pay | Admitting: Physical Therapy

## 2016-06-17 ENCOUNTER — Ambulatory Visit: Payer: PRIVATE HEALTH INSURANCE | Admitting: Physical Therapy

## 2016-06-17 DIAGNOSIS — M6281 Muscle weakness (generalized): Secondary | ICD-10-CM

## 2016-06-17 DIAGNOSIS — M25511 Pain in right shoulder: Secondary | ICD-10-CM

## 2016-06-17 NOTE — Therapy (Signed)
[place], [place], [address] Phone: [phone]   Fax:  [phone]  Physical Therapy Treatment  Patient Details  Name: Abigail Moody MRN: [id number] Date of Birth: 1946-11-05 Referring Provider: Netta Cedars  Encounter Date: 06/17/2016      PT End of Session - 06/17/16 1635    Visit Number 24   Number of Visits 28   Date for PT Re-Evaluation 07/01/16   Authorization Type WC   PT Start Time 1630   PT Stop Time 1729   PT Time Calculation (min) 59 min   Activity Tolerance Patient limited by pain   Behavior During Therapy [facility] for tasks assessed/performed      Past Medical History  Diagnosis Date  . Hypertension   . Diffuse cystic mastopathy   . GERD (gastroesophageal reflux disease)   . Hemorrhoid     Past Surgical History  Procedure Laterality Date  . Carpal tunnel release Bilateral   . Ankle surgery Right 2006  . Mouth surgery  2010  . Toe surgery Left 2006  . Breast biopsy Bilateral   . Tonsillectomy  1959  . Colonoscopy  2008    Dr. Epifanio Lesches  . Trigger finger release Right 2013-2014?    There were no vitals filed for this visit.      Subjective Assessment - 06/17/16 1629    Subjective Pt went to MD yesterday who told her everything was healing as expected and would like one more month of PT. Does not require use of pillow at work any more.    Currently in Pain? Yes   Pain Score 2    Pain Location Shoulder   Pain Orientation Right   Pain Descriptors / Indicators Sore   Pain Type Surgical pain            [facility] PT Assessment - 06/17/16 0001    AROM   Right Shoulder Flexion 82 Degrees  in seated position, pain   Right Shoulder ABduction 68 Degrees   PROM   Right Shoulder Flexion 122 Degrees   Right Shoulder ABduction 88 Degrees   Right Shoulder External Rotation 43 Degrees                     [facility] Adult PT Treatment/Exercise - 06/17/16 0001    Shoulder Exercises: Supine   Flexion 10 reps  beginning with elbow flexed   Shoulder Exercises: Standing   Extension 15 reps  with scapular retraction   Shoulder Exercises: Pulleys   Flexion 3 minutes   Shoulder Exercises: ROM/Strengthening   UBE (Upper Arm Bike) 5 min retro L1   Wall Pushups 15 reps   Shoulder Exercises: Isometric Strengthening   Extension 5X5"   ABduction 5X5"   Cryotherapy   Number Minutes Cryotherapy 10 Minutes   Cryotherapy Location Shoulder   Type of Cryotherapy Ice pack   Manual Therapy   Soft tissue mobilization upper trap, subscap, pec minor, biceps   Passive ROM flexion, abduction, ER                PT Education - 06/17/16 1635    Education provided Yes   Education Details exercise form/rationale, POC, HEP   Person(s) Educated Patient   Methods Explanation;Demonstration;Tactile cues;Verbal cues   Comprehension Verbalized understanding;Returned demonstration;Verbal cues required;Tactile cues required;Need further instruction          PT Short Term Goals - 06/05/16 1749    PT SHORT TERM GOAL #1  Title Pt will be I with initial HEP for continued strengthening and mobility by 02/09/16.    Time 4   Period Weeks   Status Achieved   PT SHORT TERM GOAL #2   Title R shoulder AROM will improve to 90 degrees pain-free for improved overhead reaching activities by 02/09/16   Time 4   Period Weeks   Status Unable to assess   PT SHORT TERM GOAL #3   Title Pt will be able to demo and verbalize proper posture as it related to upper body, shoulder, and cervical spine by 02/09/16.    Time 4   Period Weeks   Status Achieved           PT Long Term Goals - 06/17/16 1725    PT LONG TERM GOAL #1   Title Patient will improve shoulder AROM to > 140 degrees of flexion, scaption, and abduction for improved ability to perform overhead activities by 8/11   Baseline 6/27 see objective measures   Time 6   Period Weeks   Status New   PT LONG TERM GOAL  #2   Title Patient will demonstrate adequate shoulder ROM and strength to be able to shave and dress independently with pain less than 3/10   Baseline 4-5/10    Time 6   Period Weeks   Status New   PT LONG TERM GOAL #3   Title Pt will be able to use RUE to carry purse/light grocery bag pain <2/10   Baseline 3/10 carrrying purse   Time 6   Period Weeks   Status New   PT LONG TERM GOAL #4   Title Pt will be able to complete HHC such as mopping and cleaning bathtub pain <2/10   Baseline severe difficulty   Time 6   Period Weeks   Status New   PT LONG TERM GOAL #5   Title Pt would like to be able to use upper extremity to hug without pain   Time 6   Period Weeks   Status New               Plan - 06/17/16 1720    Clinical Impression Statement Pt had pain on superior aspect of shoulder with AROM. Trigger points recreated concordant pain in R upper trap, pec minor and subscapularis; discussed soreness that will occur following trigger point release. Poor scapular stability in AROM, pain slightly decreased with scapular guidance. Will continue to benefit from skilled PT to return UE to PLOF.    PT Frequency 2x / week   PT Duration 6 weeks   PT Treatment/Interventions Electrical Stimulation;Cryotherapy;Moist Heat;Ultrasound;Manual techniques;Therapeutic activities;Therapeutic exercise;ADLs/Self Care Home Management;Iontophoresis 4mg /ml Dexamethasone;Functional mobility training;Patient/family education;Neuromuscular re-education;Taping;Dry needling;Passive range of motion   PT Next Visit Plan possible ionto depending on subjective following manual treatment on 6/27   PT Home Exercise Plan pulleys, isometric abduction and ext, wall push ups   Consulted and Agree with Plan of Care Patient      Patient will benefit from skilled therapeutic intervention in order to improve the following deficits and impairments:  Impaired flexibility, Decreased strength, Decreased range of motion,  Decreased activity tolerance, Improper body mechanics, Pain  Visit Diagnosis: Muscle weakness (generalized) - Plan: PT plan of care cert/re-cert  Pain in right shoulder - Plan: PT plan of care cert/re-cert     Problem List Patient Active Problem List   Diagnosis Date Noted  . Fibrocystic breast 02/24/2014    Caran Storck C. Antione Obar PT, DPT 06/17/2016  5:37 PM   [place], [place], [address] Phone: [phone]   Fax:  [phone]  Name: MYLENE LUEVANO MRN: [id number] Date of Birth: November 02, 1946

## 2016-06-19 ENCOUNTER — Ambulatory Visit: Payer: PRIVATE HEALTH INSURANCE | Admitting: Physical Therapy

## 2016-06-19 ENCOUNTER — Encounter: Payer: Self-pay | Admitting: Physical Therapy

## 2016-06-19 DIAGNOSIS — M6281 Muscle weakness (generalized): Secondary | ICD-10-CM

## 2016-06-19 DIAGNOSIS — M25511 Pain in right shoulder: Secondary | ICD-10-CM

## 2016-06-19 NOTE — Therapy (Signed)
[place], [place], [address] Phone: [phone]   Fax:  [phone]  Physical Therapy Treatment  Patient Details  Name: Abigail Moody MRN: [id number] Date of Birth: 1946/04/01 Referring Provider: Netta Cedars  Encounter Date: 06/19/2016      PT End of Session - 06/19/16 1633    Visit Number 25   Number of Visits 28   Date for PT Re-Evaluation 07/01/16   PT Start Time 1630   PT Stop Time 1721   PT Time Calculation (min) 51 min   Activity Tolerance Patient tolerated treatment well   Behavior During Therapy [facility] for tasks assessed/performed      Past Medical History  Diagnosis Date  . Hypertension   . Diffuse cystic mastopathy   . GERD (gastroesophageal reflux disease)   . Hemorrhoid     Past Surgical History  Procedure Laterality Date  . Carpal tunnel release Bilateral   . Ankle surgery Right 2006  . Mouth surgery  2010  . Toe surgery Left 2006  . Breast biopsy Bilateral   . Tonsillectomy  1959  . Colonoscopy  2008    Dr. Epifanio Lesches  . Trigger finger release Right 2013-2014?    There were no vitals filed for this visit.      Subjective Assessment - 06/19/16 1631    Subjective Pt reports shoulder is sore today, has been doing a good bit of stretching behind her back.    Currently in Pain? Yes   Pain Score 3    Pain Orientation Right   Pain Descriptors / Indicators Sore   Pain Type Surgical pain                         [facility] Adult PT Treatment/Exercise - 06/19/16 0001    Shoulder Exercises: Standing   External Rotation 20 reps   Theraband Level (Shoulder External Rotation) Level 1 (Yellow)   Internal Rotation 20 reps   Theraband Level (Shoulder Internal Rotation) Level 1 (Yellow)   ABduction 20 reps   Theraband Level (Shoulder ABduction) Level 1 (Yellow)   ABduction Limitations band behind back   Other Standing Exercises triceps kicks 1lb x20   Shoulder  Exercises: Pulleys   Flexion 3 minutes   Shoulder Exercises: ROM/Strengthening   Rhythmic Stabilization, Seated body blade 1 min A/P, 1 min lat   Moist Heat Therapy   Number Minutes Moist Heat 10 Minutes  5 min concurrent with education   Moist Heat Location Shoulder          Trigger Point Dry Needling - 06/19/16 1726    Consent Given? Yes   Education Handout Provided No  verbal education   Muscles Treated Upper Body Upper trapezius   Upper Trapezius Response Twitch reponse elicited              PT Education - 06/19/16 1632    Education provided Yes   Education Details exercise form/rationale   Person(s) Educated Patient   Methods Explanation;Demonstration;Tactile cues;Verbal cues   Comprehension Verbalized understanding;Returned demonstration;Verbal cues required;Tactile cues required;Need further instruction          PT Short Term Goals - 06/05/16 1749    PT SHORT TERM GOAL #1   Title Pt will be I with initial HEP for continued strengthening and mobility by 02/09/16.    Time 4   Period Weeks   Status Achieved   PT SHORT TERM GOAL #2   Title R  shoulder AROM will improve to 90 degrees pain-free for improved overhead reaching activities by 02/09/16   Time 4   Period Weeks   Status Unable to assess   PT SHORT TERM GOAL #3   Title Pt will be able to demo and verbalize proper posture as it related to upper body, shoulder, and cervical spine by 02/09/16.    Time 4   Period Weeks   Status Achieved           PT Long Term Goals - 06/17/16 1725    PT LONG TERM GOAL #1   Title Patient will improve shoulder AROM to > 140 degrees of flexion, scaption, and abduction for improved ability to perform overhead activities by 8/11   Baseline 6/27 see objective measures   Time 6   Period Weeks   Status New   PT LONG TERM GOAL #2   Title Patient will demonstrate adequate shoulder ROM and strength to be able to shave and dress independently with pain less than 3/10    Baseline 4-5/10    Time 6   Period Weeks   Status New   PT LONG TERM GOAL #3   Title Pt will be able to use RUE to carry purse/light grocery bag pain <2/10   Baseline 3/10 carrrying purse   Time 6   Period Weeks   Status New   PT LONG TERM GOAL #4   Title Pt will be able to complete HHC such as mopping and cleaning bathtub pain <2/10   Baseline severe difficulty   Time 6   Period Weeks   Status New   PT LONG TERM GOAL #5   Title Pt would like to be able to use upper extremity to hug without pain   Time 6   Period Weeks   Status New               Plan - 06/19/16 1717    Clinical Impression Statement Pt tolerated dry needling well, verbalizing soreness following with decrease in concordant pain. Will continue to benefit from skilled PT to improve GHJ and periscapular strength and stability to perform  functional overhead motions. Instructed to take a warm shower and stretch before bed.    PT Next Visit Plan effects of DN?  [facility] scapular stabilizers   PT Home Exercise Plan pulleys, isometric abduction and ext, wall push ups   Consulted and Agree with Plan of Care Patient      Patient will benefit from skilled therapeutic intervention in order to improve the following deficits and impairments:     Visit Diagnosis: Muscle weakness (generalized)  Pain in right shoulder     Problem List Patient Active Problem List   Diagnosis Date Noted  . Fibrocystic breast 02/24/2014   Abigail Moody C. Abigail Moody PT, DPT 06/19/2016 5:34 PM   [facility] [address] [place], [place], [address] Phone: [phone]   Fax:  [phone]  Name: Abigail Moody MRN: [id number] Date of Birth: 04/01/1946

## 2016-06-23 ENCOUNTER — Ambulatory Visit: Payer: PRIVATE HEALTH INSURANCE | Attending: Orthopedic Surgery | Admitting: Physical Therapy

## 2016-06-23 DIAGNOSIS — M6281 Muscle weakness (generalized): Secondary | ICD-10-CM | POA: Insufficient documentation

## 2016-06-23 DIAGNOSIS — M25611 Stiffness of right shoulder, not elsewhere classified: Secondary | ICD-10-CM | POA: Diagnosis present

## 2016-06-23 DIAGNOSIS — M25511 Pain in right shoulder: Secondary | ICD-10-CM | POA: Insufficient documentation

## 2016-06-23 DIAGNOSIS — M75101 Unspecified rotator cuff tear or rupture of right shoulder, not specified as traumatic: Secondary | ICD-10-CM

## 2016-06-23 NOTE — Therapy (Signed)
[place], [place], [address] Phone: [phone]   Fax:  [phone]  Physical Therapy Treatment  Patient Details  Name: Abigail Moody MRN: [id number] Date of Birth: August 23, 1946 Referring Provider: Netta Cedars  Encounter Date: 06/23/2016      PT End of Session - 06/23/16 1728    Visit Number 26   Number of Visits 28   Date for PT Re-Evaluation 07/01/16   PT Start Time 1632   PT Stop Time 1732   PT Time Calculation (min) 60 min   Activity Tolerance Patient tolerated treatment well   Behavior During Therapy [facility] for tasks assessed/performed      Past Medical History  Diagnosis Date  . Hypertension   . Diffuse cystic mastopathy   . GERD (gastroesophageal reflux disease)   . Hemorrhoid     Past Surgical History  Procedure Laterality Date  . Carpal tunnel release Bilateral   . Ankle surgery Right 2006  . Mouth surgery  2010  . Toe surgery Left 2006  . Breast biopsy Bilateral   . Tonsillectomy  1959  . Colonoscopy  2008    Dr. Epifanio Lesches  . Trigger finger release Right 2013-2014?    There were no vitals filed for this visit.      Subjective Assessment - 06/23/16 1637    Subjective DN helped some.  Able to decrease a lot less tightness upper trap.  Sore to touch.     Currently in Pain? Yes   Pain Score 3    Pain Location Shoulder   Pain Orientation Right   Pain Descriptors / Indicators Sore   Pain Frequency Intermittent   Aggravating Factors  reaching   Pain Relieving Factors DN, rest   Effect of Pain on Daily Activities Above head tasks unable, unable to hook bra            [facility] PT Assessment - 06/23/16 1641    AROM   Right Shoulder Flexion 107 Degrees  Some scaption,  before stretching, after UBE                     [facility] Adult PT Treatment/Exercise - 06/23/16 0001    Shoulder Exercises: Seated   Other Seated Exercises UE ranger various moves  highest  height   Shoulder Exercises: Standing   Theraband Level (Shoulder Protraction) Level 1 (Yellow)  serratus punch 10 x, 2 sets ,no increased pain.    External Rotation 10 reps  2 sets, with towel roll,cued for pain free   Theraband Level (Shoulder External Rotation) Level 1 (Yellow)   Internal Rotation 10 reps   Theraband Level (Shoulder Internal Rotation) Level 1 (Yellow)  towel roll, small motions   Row 10 reps  2 sets , red band    Other Standing Exercises triceps kicks 1lb x10 X 2 sets.   minassist with shoulder position, cues to keep shoulder down.    Other Standing Exercises closed chain rolling ball  on mat with pressure,  minor cues  circles, diagonals,  flexipon,  3-5 X each as far as she could reach.    Shoulder Exercises: ROM/Strengthening   Wall Pushups Limitations   Wall Pushups Limitations painful after 7-8 reps so stopped   Shoulder Exercises: Isometric Strengthening   Extension --  10 x 5 , 2 sets   ABduction --  10 x 5   Shoulder Exercises: Stretch   Other Shoulder Stretches Upper trap, levater stretch 2 reps  10-20 second holds to decrease tension  withich increased with exercise.    Moist Heat Therapy   Number Minutes Moist Heat 15 Minutes   Moist Heat Location Shoulder   Manual Therapy   Manual therapy comments brief to deltiod, upper trap  forrloed by stretching active  to upper trap  mild passive stretch while supine at end of session:guarding                  PT Short Term Goals - 06/05/16 1749    PT SHORT TERM GOAL #1   Title Pt will be I with initial HEP for continued strengthening and mobility by 02/09/16.    Time 4   Period Weeks   Status Achieved   PT SHORT TERM GOAL #2   Title R shoulder AROM will improve to 90 degrees pain-free for improved overhead reaching activities by 02/09/16   Time 4   Period Weeks   Status Unable to assess   PT SHORT TERM GOAL #3   Title Pt will be able to demo and verbalize proper posture as it related to upper  body, shoulder, and cervical spine by 02/09/16.    Time 4   Period Weeks   Status Achieved           PT Long Term Goals - 06/17/16 1725    PT LONG TERM GOAL #1   Title Patient will improve shoulder AROM to > 140 degrees of flexion, scaption, and abduction for improved ability to perform overhead activities by 8/11   Baseline 6/27 see objective measures   Time 6   Period Weeks   Status New   PT LONG TERM GOAL #2   Title Patient will demonstrate adequate shoulder ROM and strength to be able to shave and dress independently with pain less than 3/10   Baseline 4-5/10    Time 6   Period Weeks   Status New   PT LONG TERM GOAL #3   Title Pt will be able to use RUE to carry purse/light grocery bag pain <2/10   Baseline 3/10 carrrying purse   Time 6   Period Weeks   Status New   PT LONG TERM GOAL #4   Title Pt will be able to complete HHC such as mopping and cleaning bathtub pain <2/10   Baseline severe difficulty   Time 6   Period Weeks   Status New   PT LONG TERM GOAL #5   Title Pt would like to be able to use upper extremity to hug without pain   Time 6   Period Weeks   Status New               Plan - 06/23/16 1729    Clinical Impression Statement Needling helped her pain and arm movement with AROM to 110 (Some scaption)  Pain increased intermitantly during session and was 5/10 at end, prior to heat.     PT Next Visit Plan closed chain scapular stabilizers,  Gentle stretches   PT Home Exercise Plan continue, nothing new added      Patient will benefit from skilled therapeutic intervention in order to improve the following deficits and impairments:  Impaired flexibility, Decreased strength, Decreased range of motion, Decreased activity tolerance, Improper body mechanics, Pain  Visit Diagnosis: Muscle weakness (generalized)  Pain in right shoulder  RCT (rotator cuff tear), right  Stiffness of right shoulder, not elsewhere classified     Problem  List Patient Active Problem List  Diagnosis Date Noted  . Fibrocystic breast 02/24/2014    [facility] 06/23/2016, 5:41 PM  [facility] [address] [place], [place], [address] Phone: [phone]   Fax:  [phone]  Name: Abigail Moody MRN: [id number] Date of Birth: [birth year]    Melvenia Needles, PTA 06/23/2016 5:41 PM Phone: [phone] Fax: [phone]

## 2016-06-26 ENCOUNTER — Ambulatory Visit: Payer: PRIVATE HEALTH INSURANCE | Admitting: Physical Therapy

## 2016-06-26 DIAGNOSIS — M75101 Unspecified rotator cuff tear or rupture of right shoulder, not specified as traumatic: Secondary | ICD-10-CM

## 2016-06-26 DIAGNOSIS — M6281 Muscle weakness (generalized): Secondary | ICD-10-CM

## 2016-06-26 DIAGNOSIS — M25511 Pain in right shoulder: Secondary | ICD-10-CM

## 2016-06-26 NOTE — Therapy (Signed)
[place], [place], [address] Phone: [phone]   Fax:  [phone]  Physical Therapy Treatment  Patient Details  Name: Abigail Moody MRN: [id number] Date of Birth: 31-Mar-1946 Referring Provider: Netta Cedars  Encounter Date: 06/26/2016      PT End of Session - 06/26/16 1744    Visit Number 27   Number of Visits 28   Date for PT Re-Evaluation 07/01/16   PT Start Time 1632   PT Stop Time 1735   PT Time Calculation (min) 63 min   Activity Tolerance Patient tolerated treatment well   Behavior During Therapy [facility] for tasks assessed/performed      Past Medical History  Diagnosis Date  . Hypertension   . Diffuse cystic mastopathy   . GERD (gastroesophageal reflux disease)   . Hemorrhoid     Past Surgical History  Procedure Laterality Date  . Carpal tunnel release Bilateral   . Ankle surgery Right 2006  . Mouth surgery  2010  . Toe surgery Left 2006  . Breast biopsy Bilateral   . Tonsillectomy  1959  . Colonoscopy  2008    Dr. Epifanio Lesches  . Trigger finger release Right 2013-2014?    There were no vitals filed for this visit.      Subjective Assessment - 06/26/16 1638    Subjective 2/10   a little sore.    Currently in Pain? Yes   Pain Score 2    Pain Location Shoulder   Pain Orientation Right   Pain Descriptors / Indicators Sore            [facility] PT Assessment - 06/26/16 0001    AROM   Right Shoulder Flexion 90 Degrees  painful                     [facility] Adult PT Treatment/Exercise - 06/26/16 0001    Shoulder Exercises: Seated   Other Seated Exercises hand stretch wide,  pinches, focus on pressing with curved fingers 2-3 x each, cues   Other Seated Exercises 2 lbs pass from rt hand to left hand   Shoulder Exercises: Prone   Other Prone Exercises leaning over counter,  single arm row 10 X 0 pounds, 10 X 2 pounds.  (Back a little stiff)  guided scapular motions  intermittantly.    Shoulder Exercises: Standing   External Rotation 10 reps  2 sets, with towel roll,cued for pain free   Theraband Level (Shoulder External Rotation) Level 1 (Yellow)   Internal Rotation 10 reps   Theraband Level (Shoulder Internal Rotation) Level 1 (Yellow)  towel roll, small motions   Row 10 reps  2 sets , red band    Other Standing Exercises cane shoulder extension 10 x, and shoulder cane IR, sliding up back.     Shoulder Exercises: Pulleys   Flexion --  5 minutes.   Shoulder Exercises: ROM/Strengthening   Wall Pushups 10 reps  no pain today   Other ROM/Strengthening Exercises ranger 15 X flexion able to do with less compensation.     Other ROM/Strengthening Exercises wall slide into flexion with AA arm lift off wall at highest level,  painful and difficult.    Shoulder Exercises: Isometric Strengthening   Flexion 5X5"   Extension 5X5"   ABduction 5X5"   Moist Heat Therapy   Number Minutes Moist Heat 15 Minutes   Moist Heat Location Shoulder;Cervical   Manual Therapy   Manual therapy comments neuromuscular  trigger point release, mild pressure upper traps,,tissue softened.     Passive ROM Flexion, abduction 2 sets of 5 each                  PT Short Term Goals - 06/26/16 1754    PT SHORT TERM GOAL #1   Title Pt will be I with initial HEP for continued strengthening and mobility by 02/09/16.    Time 4   Period Weeks   Status Achieved   PT SHORT TERM GOAL #2   Title R shoulder AROM will improve to 90 degrees pain-free for improved overhead reaching activities by 02/09/16   Baseline 90 degrees, not pain free   Time 4   Period Weeks   Status On-going   PT SHORT TERM GOAL #3   Title Pt will be able to demo and verbalize proper posture as it related to upper body, shoulder, and cervical spine by 02/09/16.    Time 4   Period Weeks   Status Achieved           PT Long Term Goals - 06/26/16 1756    PT LONG TERM GOAL #1   Title Patient will  improve shoulder AROM to > 140 degrees of flexion, scaption, and abduction for improved ability to perform overhead activities by 8/11   Baseline varies.  90 today   Time 6   Status On-going   PT LONG TERM GOAL #2   Title Patient will demonstrate adequate shoulder ROM and strength to be able to shave and dress independently with pain less than 3/10   Time 6   Period Weeks   Status Unable to assess   PT LONG TERM GOAL #3   Title Pt will be able to use RUE to carry purse/light grocery bag pain <2/10   Time 6   Period Weeks   Status Unable to assess   PT LONG TERM GOAL #4   Title Pt will be able to complete HHC such as mopping and cleaning bathtub pain <2/10   Time 6   Period Weeks   Status Unable to assess   PT LONG TERM GOAL #5   Title Pt would like to be able to use upper extremity to hug without pain   Time 6   Period Weeks   Status On-going               Plan - 06/26/16 1745    Clinical Impression Statement AROM decreased today to 90 however she was able to control shoulder with the movement.   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 (Hand cramps)   PT Next Visit Plan closed chain scapular stabilizers,  Gentle stretches   PT Home Exercise Plan continue, nothing new added   Consulted and Agree with Plan of Care Patient      Patient will benefit from skilled therapeutic intervention in order to improve the following deficits and impairments:  Impaired flexibility, Decreased strength, Decreased range of motion, Decreased activity tolerance, Improper body mechanics, Pain  Visit Diagnosis: Muscle weakness (generalized)  Pain in right shoulder  RCT (rotator cuff tear), right     Problem List Patient Active Problem List   Diagnosis Date Noted  . Fibrocystic breast 02/24/2014    [facility] 06/26/2016, 6:01 PM  [facility] [address] [place], [place], [address] Phone: [phone]   Fax:   [phone]  Name: Abigail Moody MRN: [id number] Date of Birth: 07-04-1946  Melvenia Needles, PTA 06/26/2016 6:01 PM Phone: [phone] Fax: [phone]

## 2016-07-01 ENCOUNTER — Ambulatory Visit: Payer: PRIVATE HEALTH INSURANCE | Admitting: Physical Therapy

## 2016-07-01 ENCOUNTER — Encounter: Payer: Self-pay | Admitting: Physical Therapy

## 2016-07-01 DIAGNOSIS — M6281 Muscle weakness (generalized): Secondary | ICD-10-CM | POA: Diagnosis not present

## 2016-07-01 DIAGNOSIS — M25511 Pain in right shoulder: Secondary | ICD-10-CM

## 2016-07-01 NOTE — Therapy (Signed)
[place], [place], [address] Phone: [phone]   Fax:  [phone]  Physical Therapy Treatment  Patient Details  Name: Abigail Moody MRN: [id number] Date of Birth: [birth year] Referring Provider: Netta Cedars  Encounter Date: 07/01/2016      PT End of Session - 07/01/16 1721    Visit Number 28   Number of Visits 38   Date for PT Re-Evaluation 08/01/16   Authorization Type WC- 10 more visits allowed effective 7/13   PT Start Time 1635   PT Stop Time 1726   PT Time Calculation (min) 51 min   Activity Tolerance Patient tolerated treatment well   Behavior During Therapy [facility] for tasks assessed/performed      Past Medical History  Diagnosis Date  . Hypertension   . Diffuse cystic mastopathy   . GERD (gastroesophageal reflux disease)   . Hemorrhoid     Past Surgical History  Procedure Laterality Date  . Carpal tunnel release Bilateral   . Ankle surgery Right 2006  . Mouth surgery  2010  . Toe surgery Left 2006  . Breast biopsy Bilateral   . Tonsillectomy  1959  . Colonoscopy  2008    Dr. Epifanio Lesches  . Trigger finger release Right 2013-2014?    There were no vitals filed for this visit.      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 Yes   Pain Score 4    Pain Location Shoulder   Pain Orientation Right   Pain Descriptors / Indicators Sore;Aching;Throbbing   Pain Type Surgical pain   Aggravating Factors  reaching, lifting   Pain Relieving Factors rest, rubbing shoulder            [facility] PT Assessment - 07/01/16 0001    AROM   Right/Left Shoulder Right   Right Shoulder Extension 32 Degrees   Right Shoulder Flexion 95 Degrees   Right Shoulder ABduction 78 Degrees   Right Shoulder Internal  Rotation --  sacrum to midline   Right Shoulder External Rotation --  unable to reach midline of head   PROM   Right Shoulder Flexion 141 Degrees   Right Shoulder ABduction 108 Degrees   Right Shoulder Internal Rotation 44 Degrees   Right Shoulder External Rotation 36 Degrees   Strength   Right/Left Shoulder Right;Left   Right Shoulder Flexion 3-/5  unable to obtain full ROM   Right Shoulder Extension 4+/5   Right Shoulder ABduction 3-/5  unable to move through full ROM   Right Shoulder Internal Rotation 4+/5   Right Shoulder External Rotation 3+/5   Palpation   Palpation comment trigger points concordant pain R pec minor                     [facility] Adult PT Treatment/Exercise - 07/01/16 0001    Shoulder Exercises: Seated   External Rotation Strengthening   External Rotation Limitations elbow resting on table x30   Flexion 20 reps;10 reps  10 with elbow ext, 20 with elbow flx   Flexion Limitations from rested on table lift within available range   Shoulder Exercises: Prone   Other Prone Exercises single arm row with triceps kick, bent over table  PT tactile cuing for scapular control   Shoulder  Exercises: Pulleys   Flexion 3 minutes   Other Pulley Exercises IR 2 min- towel roll under arm   Shoulder Exercises: Isometric Strengthening   ADduction Other (comment)  10x5s   Moist Heat Therapy   Number Minutes Moist Heat 10 Minutes   Moist Heat Location Shoulder;Cervical   Manual Therapy   Soft tissue mobilization pec minor trigger point release   Passive ROM external rotation stretch, contract/relax.                 PT Education - 07/01/16 1718    Education provided Yes   Education Details exercise form/rationale, progress with motion and strength   Person(s) Educated Patient   Methods Explanation;Demonstration;Tactile cues;Verbal cues   Comprehension Verbalized understanding;Returned demonstration;Verbal cues required;Tactile cues required;Need  further instruction          PT Short Term Goals - 07/01/16 1730    PT SHORT TERM GOAL #1   Title Pt will be I with initial HEP for continued strengthening and mobility   Status Achieved   PT SHORT TERM GOAL #2   Title R shoulder AROM will improve to 90 degrees pain-free for improved overhead reaching activities   Status Achieved   PT SHORT TERM GOAL #3   Title Pt will be able to demo and verbalize proper posture as it related to upper body, shoulder, and cervical spine    Status Achieved           PT Long Term Goals - 06/26/16 1756    PT LONG TERM GOAL #1   Title Patient will improve shoulder AROM to > 140 degrees of flexion, scaption, and abduction for improved ability to perform overhead activities by 8/11   Baseline varies.  90 today   Time 6   Status On-going   PT LONG TERM GOAL #2   Title Patient will demonstrate adequate shoulder ROM and strength to be able to shave and dress independently with pain less than 3/10   Time 6   Period Weeks   Status Unable to assess   PT LONG TERM GOAL #3   Title Pt will be able to use RUE to carry purse/light grocery bag pain <2/10   Time 6   Period Weeks   Status Unable to assess   PT LONG TERM GOAL #4   Title Pt will be able to complete HHC such as mopping and cleaning bathtub pain <2/10   Time 6   Period Weeks   Status Unable to assess   PT LONG TERM GOAL #5   Title Pt would like to be able to use upper extremity to hug without pain   Time 6   Period Weeks   Status On-going               Plan - 07/01/16 1722    Clinical Impression Statement Pt has made good progress toward goals but continues to lack functional strength and motion. Will continue to benefit from skilled PT in order to continue to progress toward functional strenght and motion.    Rehab Potential Good   PT Frequency 2x / week   PT Duration 6 weeks   PT Treatment/Interventions Electrical Stimulation;Cryotherapy;Moist Heat;Ultrasound;Manual  techniques;Therapeutic activities;Therapeutic exercise;ADLs/Self Care Home Management;Iontophoresis 4mg /ml Dexamethasone;Functional mobility training;Patient/family education;Neuromuscular re-education;Taping;Dry needling;Passive range of motion   PT Next Visit Plan closed chain scapular stabilizers,  Gentle stretches, DN when available.    PT Home Exercise Plan continue, nothing new added   Consulted and Agree with Plan of  Care Patient      Patient will benefit from skilled therapeutic intervention in order to improve the following deficits and impairments:  Impaired flexibility, Decreased strength, Decreased range of motion, Decreased activity tolerance, Improper body mechanics, Pain  Visit Diagnosis: Muscle weakness (generalized) - Plan: PT plan of care cert/re-cert  Pain in right shoulder - Plan: PT plan of care cert/re-cert     Problem List Patient Active Problem List   Diagnosis Date Noted  . Fibrocystic breast 02/24/2014    [place] C. Karelyn Brisby PT, DPT 07/01/2016 5:32 PM   [place] [facility] [address] [place], [place], [address] Phone: [phone]   Fax:  [phone]  Name: Abigail Moody MRN: [id number] Date of Birth: 1946/01/18

## 2016-07-03 ENCOUNTER — Encounter: Payer: Self-pay | Admitting: Physical Therapy

## 2016-07-03 ENCOUNTER — Ambulatory Visit: Payer: PRIVATE HEALTH INSURANCE | Admitting: Physical Therapy

## 2016-07-03 DIAGNOSIS — M6281 Muscle weakness (generalized): Secondary | ICD-10-CM

## 2016-07-03 DIAGNOSIS — M25511 Pain in right shoulder: Secondary | ICD-10-CM

## 2016-07-03 NOTE — Therapy (Signed)
[place], [place], [address] Phone: [phone]   Fax:  [phone]  Physical Therapy Treatment  Patient Details  Name: Abigail Moody MRN: [id number] Date of Birth: 01-26-1946 Referring Provider: Netta Cedars  Encounter Date: 07/03/2016      PT End of Session - 07/03/16 1712    Visit Number 29   Number of Visits 65   Date for PT Re-Evaluation 08/01/16   Authorization Type WC- 10 more visits allowed effective 7/13   PT Start Time 1632   PT Stop Time 1721   PT Time Calculation (min) 49 min   Activity Tolerance Patient tolerated treatment well   Behavior During Therapy [facility] for tasks assessed/performed      Past Medical History  Diagnosis Date  . Hypertension   . Diffuse cystic mastopathy   . GERD (gastroesophageal reflux disease)   . Hemorrhoid     Past Surgical History  Procedure Laterality Date  . Carpal tunnel release Bilateral   . Ankle surgery Right 2006  . Mouth surgery  2010  . Toe surgery Left 2006  . Breast biopsy Bilateral   . Tonsillectomy  1959  . Colonoscopy  2008    Dr. Epifanio Lesches  . Trigger finger release Right 2013-2014?    There were no vitals filed for this visit.      Subjective Assessment - 07/03/16 1633    Subjective feels like shoulder is "waking up" still a little sore and achey   Currently in Pain? Yes   Pain Score 2    Pain Location Shoulder   Pain Orientation Right   Pain Descriptors / Indicators Sore   Pain Type Surgical pain                         [facility] Adult PT Treatment/Exercise - 07/03/16 0001    Shoulder Exercises: Seated   External Rotation Limitations elbow resting on table x30   Abduction Other (comment);15 reps  from ER (elbow on table) and lift   Shoulder Exercises: Prone   Flexion 20 reps   Extension 20 reps   Shoulder Exercises: Standing   Flexion 15 reps   Flexion Limitations wand assisted   Extension 15 reps   Extension Limitations with wand   Other Standing Exercises short lever arm flexion x15   Other Standing Exercises therapist place arm in ranges and pt hold   Shoulder Exercises: Stretch   Other Shoulder Stretches R median nerve glide in standing   Moist Heat Therapy   Number Minutes Moist Heat 10 Minutes   Moist Heat Location Shoulder;Cervical   Manual Therapy   Soft tissue mobilization trigger point release R UT, scalenes, levator                PT Education - 07/03/16 1712    Education provided Yes   Education Details exercise form/rationale   Person(s) Educated Patient   Methods Explanation;Demonstration;Tactile cues;Verbal cues   Comprehension Verbalized understanding;Returned demonstration;Verbal cues required;Tactile cues required;Need further instruction          PT Short Term Goals - 07/01/16 1730    PT SHORT TERM GOAL #1   Title Pt will be I with initial HEP for continued strengthening and mobility   Status Achieved   PT SHORT TERM GOAL #2   Title R shoulder AROM will improve to 90 degrees pain-free for improved overhead reaching activities   Status Achieved   PT SHORT TERM  GOAL #3   Title Pt will be able to demo and verbalize proper posture as it related to upper body, shoulder, and cervical spine    Status Achieved           PT Long Term Goals - 06/26/16 1756    PT LONG TERM GOAL #1   Title Patient will improve shoulder AROM to > 140 degrees of flexion, scaption, and abduction for improved ability to perform overhead activities by 8/11   Baseline varies.  90 today   Time 6   Status On-going   PT LONG TERM GOAL #2   Title Patient will demonstrate adequate shoulder ROM and strength to be able to shave and dress independently with pain less than 3/10   Time 6   Period Weeks   Status Unable to assess   PT LONG TERM GOAL #3   Title Pt will be able to use RUE to carry purse/light grocery bag pain <2/10   Time 6   Period Weeks   Status Unable to  assess   PT LONG TERM GOAL #4   Title Pt will be able to complete HHC such as mopping and cleaning bathtub pain <2/10   Time 6   Period Weeks   Status Unable to assess   PT LONG TERM GOAL #5   Title Pt would like to be able to use upper extremity to hug without pain   Time 6   Period Weeks   Status On-going               Plan - 07/03/16 1715    Clinical Impression Statement Pt is able to demonstrate AROM to 90 in flexion with arm extended and slightly above with elbow bent indicating weakness in long lever arm motions necessary for functional activities.    PT Next Visit Plan closed chain scapular stabilizers,  Gentle stretches, DN when available.    PT Home Exercise Plan mirror ROM   Consulted and Agree with Plan of Care Patient      Patient will benefit from skilled therapeutic intervention in order to improve the following deficits and impairments:     Visit Diagnosis: Muscle weakness (generalized)  Pain in right shoulder     Problem List Patient Active Problem List   Diagnosis Date Noted  . Fibrocystic breast 02/24/2014    Abigail Moody C. Shaelyn Decarli PT, DPT 07/03/2016 5:20 PM   [place] [facility] [address] [place], [place], [address] Phone: [phone]   Fax:  [phone]  Name: Abigail Moody MRN: [id number] Date of Birth: [birth year]

## 2016-07-08 ENCOUNTER — Encounter: Payer: PRIVATE HEALTH INSURANCE | Admitting: Physical Therapy

## 2016-07-08 ENCOUNTER — Ambulatory Visit: Payer: PRIVATE HEALTH INSURANCE | Admitting: Physical Therapy

## 2016-07-08 DIAGNOSIS — M25511 Pain in right shoulder: Secondary | ICD-10-CM

## 2016-07-08 DIAGNOSIS — M6281 Muscle weakness (generalized): Secondary | ICD-10-CM

## 2016-07-08 NOTE — Therapy (Signed)
[place], [place], [address] Phone: [phone]   Fax:  [phone]  Physical Therapy Treatment  Patient Details  Name: Abigail Moody MRN: [id number] Date of Birth: Feb 06, 1946 Referring Provider: Netta Cedars  Encounter Date: 07/08/2016      PT End of Session - 07/08/16 1649    Visit Number 30   Number of Visits 38   Date for PT Re-Evaluation 08/01/16   PT Start Time 1500   PT Stop Time 1550   PT Time Calculation (min) 50 min   Activity Tolerance Patient tolerated treatment well   Behavior During Therapy [facility] for tasks assessed/performed      Past Medical History  Diagnosis Date  . Hypertension   . Diffuse cystic mastopathy   . GERD (gastroesophageal reflux disease)   . Hemorrhoid     Past Surgical History  Procedure Laterality Date  . Carpal tunnel release Bilateral   . Ankle surgery Right 2006  . Mouth surgery  2010  . Toe surgery Left 2006  . Breast biopsy Bilateral   . Tonsillectomy  1959  . Colonoscopy  2008    Dr. Epifanio Lesches  . Trigger finger release Right 2013-2014?    There were no vitals filed for this visit.      Subjective Assessment - 07/08/16 1503    Subjective "I guess it's doing okay today"    Currently in Pain? Yes   Pain Score 2    Pain Orientation Right   Pain Descriptors / Indicators Sore   Pain Type Surgical pain   Pain Onset More than a month ago   Pain Frequency Intermittent   Aggravating Factors  reaching/ lifting   Pain Relieving Factors resting, rubbing the shoulder                         [facility] Adult PT Treatment/Exercise - 07/08/16 0001    Shoulder Exercises: ROM/Strengthening   UBE (Upper Arm Bike) L 1 x 5 min  forward 2:30/ backward 2:30   Other ROM/Strengthening Exercises flexion reaching to bottom cabinet 1 x 10   Other ROM/Strengthening Exercises internal rotation behind back ROM with towel AAROM 1 x 10 with 10 sec hold    Shoulder Exercises: Stretch   Other Shoulder Stretches contract/ relax R upper trap stretch 2 x 30 with 10 sec contraction   Moist Heat Therapy   Number Minutes Moist Heat 10 Minutes   Moist Heat Location Shoulder;Cervical   Manual Therapy   Manual Therapy Soft tissue mobilization;Myofascial release;Scapular mobilization   Soft tissue mobilization IASTM along R upper trap and levator scapulae   Myofascial Release fascial rolling/ sustained stretch of  R upper trap/ levator scapulae   Scapular Mobilization scapular upward rotation assist with unweight scaption 1 x 10          Trigger Point Dry Needling - 07/08/16 1645    Consent Given? Yes   Muscles Treated Upper Body Upper trapezius   Upper Trapezius Response Twitch reponse elicited;Palpable increased muscle length  x 3 with pistoning/ twisting technique                PT Short Term Goals - 07/01/16 1730    PT SHORT TERM GOAL #1   Title Pt will be I with initial HEP for continued strengthening and mobility   Status Achieved   PT SHORT TERM GOAL #2   Title R shoulder AROM will improve to 90 degrees  pain-free for improved overhead reaching activities   Status Achieved   PT SHORT TERM GOAL #3   Title Pt will be able to demo and verbalize proper posture as it related to upper body, shoulder, and cervical spine    Status Achieved           PT Long Term Goals - 06/26/16 1756    PT LONG TERM GOAL #1   Title Patient will improve shoulder AROM to > 140 degrees of flexion, scaption, and abduction for improved ability to perform overhead activities by 8/11   Baseline varies.  41 today   Time 6   Status On-going   PT LONG TERM GOAL #2   Title Patient will demonstrate adequate shoulder ROM and strength to be able to shave and dress independently with pain less than 3/10   Time 6   Period Weeks   Status Unable to assess   PT LONG TERM GOAL #3   Title Pt will be able to use RUE to carry purse/light grocery bag pain <2/10    Time 6   Period Weeks   Status Unable to assess   PT LONG TERM GOAL #4   Title Pt will be able to complete HHC such as mopping and cleaning bathtub pain <2/10   Time 6   Period Weeks   Status Unable to assess   PT LONG TERM GOAL #5   Title Pt would like to be able to use upper extremity to hug without pain   Time 6   Period Weeks   Status On-going               Plan - 07/08/16 1649    Clinical Impression Statement Mrs. Railey provided consent of DN of R upper trap x 3; pt monitored throughout treatment. Following manual techniques she reported decreased soreness, and decreased tightness in the  R upper trap. performed gentle scaption / reaching in to cabinet to facilitate functional ROM with manual scapular assist which reduce soreness and improved mobility.     PT Next Visit Plan assess response to Dn, closed chain scapular stabilizers,  Gentle stretches,    Consulted and Agree with Plan of Care Patient      Patient will benefit from skilled therapeutic intervention in order to improve the following deficits and impairments:  Impaired flexibility, Decreased strength, Decreased range of motion, Decreased activity tolerance, Improper body mechanics, Pain  Visit Diagnosis: Muscle weakness (generalized)  Pain in right shoulder     Problem List Patient Active Problem List   Diagnosis Date Noted  . Fibrocystic breast 02/24/2014   Starr [place] PT, DPT, LAT, ATC  07/08/2016  4:57 PM      [facility] [facility] [address] [place], [place], [address] Phone: [phone]   Fax:  [phone]  Name: Abigail Moody MRN: [id number] Date of Birth: [birth year]

## 2016-07-10 ENCOUNTER — Ambulatory Visit: Payer: PRIVATE HEALTH INSURANCE | Admitting: Physical Therapy

## 2016-07-10 ENCOUNTER — Encounter: Payer: Self-pay | Admitting: Physical Therapy

## 2016-07-10 DIAGNOSIS — M6281 Muscle weakness (generalized): Secondary | ICD-10-CM

## 2016-07-10 DIAGNOSIS — M25511 Pain in right shoulder: Secondary | ICD-10-CM

## 2016-07-10 NOTE — Therapy (Signed)
[place], [place], [address] Phone: [phone]   Fax:  [phone]  Physical Therapy Treatment  Patient Details  Name: Abigail Moody MRN: [id number] Date of Birth: 08-Sep-1946 Referring Provider: Netta Cedars  Encounter Date: 07/10/2016      PT End of Session - 07/10/16 1720    Visit Number 31   Number of Visits 6   Date for PT Re-Evaluation 08/01/16   Authorization Type WC- 10 more visits allowed effective 7/13   PT Start Time 1633   PT Stop Time 1728   PT Time Calculation (min) 55 min   Activity Tolerance Patient tolerated treatment well   Behavior During Therapy [facility] for tasks assessed/performed      Past Medical History  Diagnosis Date  . Hypertension   . Diffuse cystic mastopathy   . GERD (gastroesophageal reflux disease)   . Hemorrhoid     Past Surgical History  Procedure Laterality Date  . Carpal tunnel release Bilateral   . Ankle surgery Right 2006  . Mouth surgery  2010  . Toe surgery Left 2006  . Breast biopsy Bilateral   . Tonsillectomy  1959  . Colonoscopy  2008    Dr. Epifanio Lesches  . Trigger finger release Right 2013-2014?    There were no vitals filed for this visit.      Subjective Assessment - 07/10/16 1643    Subjective Pt reports significant soreness in upper trap today, better than it was yesterday.    Currently in Pain? Yes   Pain Score 4    Pain Location Shoulder   Pain Orientation Right   Pain Descriptors / Indicators Sore            [facility] PT Assessment - 07/10/16 0001    Observation/Other Assessments   Quick DASH  40%   AROM   Right Shoulder Flexion 75 Degrees  without shoulder hike   Right Shoulder ABduction 60 Degrees  without shoulder hike                     [facility] Adult PT Treatment/Exercise - 07/10/16 0001    Shoulder Exercises: Supine   Protraction Limitations chest press with protraction 1lb   Flexion Limitations wand  assisted and active   Shoulder Exercises: Standing   Row 20 reps   Theraband Level (Shoulder Row) Level 1 (Yellow)   Shoulder Exercises: Pulleys   Flexion Other (comment)  10 min   Flexion Limitations --  mirror for cues, passive and AAROM   Shoulder Exercises: ROM/Strengthening   Other ROM/Strengthening Exercises UE ranger flx   Other ROM/Strengthening Exercises stretch with wand 2 min ea: flx, ER   Moist Heat Therapy   Number Minutes Moist Heat 15 Minutes  5 following manual, 10 end of treatment   Moist Heat Location Shoulder;Cervical   Manual Therapy   Soft tissue mobilization IASTM along R upper trap and levator scapulae                PT Education - 07/10/16 1724    Education provided Yes   Education Details exercise form/rationale, importance of gaining ROM   Person(s) Educated Patient   Methods Explanation;Demonstration;Tactile cues;Verbal cues   Comprehension Verbalized understanding;Returned demonstration;Verbal cues required;Tactile cues required;Need further instruction          PT Short Term Goals - 07/01/16 1730    PT SHORT TERM GOAL #1   Title Pt will be I with initial HEP for  continued strengthening and mobility   Status Achieved   PT SHORT TERM GOAL #2   Title R shoulder AROM will improve to 90 degrees pain-free for improved overhead reaching activities   Status Achieved   PT SHORT TERM GOAL #3   Title Pt will be able to demo and verbalize proper posture as it related to upper body, shoulder, and cervical spine    Status Achieved           PT Long Term Goals - 06/26/16 1756    PT LONG TERM GOAL #1   Title Patient will improve shoulder AROM to > 140 degrees of flexion, scaption, and abduction for improved ability to perform overhead activities by 8/11   Baseline varies.  90 today   Time 6   Status On-going   PT LONG TERM GOAL #2   Title Patient will demonstrate adequate shoulder ROM and strength to be able to shave and dress independently  with pain less than 3/10   Time 6   Period Weeks   Status Unable to assess   PT LONG TERM GOAL #3   Title Pt will be able to use RUE to carry purse/light grocery bag pain <2/10   Time 6   Period Weeks   Status Unable to assess   PT LONG TERM GOAL #4   Title Pt will be able to complete HHC such as mopping and cleaning bathtub pain <2/10   Time 6   Period Weeks   Status Unable to assess   PT LONG TERM GOAL #5   Title Pt would like to be able to use upper extremity to hug without pain   Time 6   Period Weeks   Status On-going               Plan - 07/10/16 1732    Clinical Impression Statement Pt is demonstrating improved passive ROM with pulleys and in supine position but continues to have significant pain with active range of motion. Continues to require heavy verbal, tactile and visual cuing to avoid shoulder elevation.  Reported improvement on QuickDash   PT Next Visit Plan closed chain scapular stabilizers,  Gentle stretches,    PT Home Exercise Plan mirror ROM   Consulted and Agree with Plan of Care Patient      Patient will benefit from skilled therapeutic intervention in order to improve the following deficits and impairments:     Visit Diagnosis: Muscle weakness (generalized)  Pain in right shoulder     Problem List Patient Active Problem List   Diagnosis Date Noted  . Fibrocystic breast 02/24/2014    Kendra Grissett C. Chen Holzman PT, DPT 07/10/2016 5:36 PM   [place], [place], [address] Phone: [phone]   Fax:  [phone]  Name: CORDELL GOLUB MRN: [id number] Date of Birth: 11/11/1946

## 2016-07-15 ENCOUNTER — Ambulatory Visit: Payer: PRIVATE HEALTH INSURANCE | Admitting: Physical Therapy

## 2016-07-15 ENCOUNTER — Encounter: Payer: Self-pay | Admitting: Physical Therapy

## 2016-07-15 DIAGNOSIS — M6281 Muscle weakness (generalized): Secondary | ICD-10-CM

## 2016-07-15 DIAGNOSIS — M25511 Pain in right shoulder: Secondary | ICD-10-CM

## 2016-07-15 NOTE — Therapy (Signed)
[place], [place], [address] Phone: [phone]   Fax:  [phone]  Physical Therapy Treatment  Patient Details  Name: Abigail Moody MRN: [id number] Date of Birth: [birth year] Referring Provider: Netta Cedars  Encounter Date: 07/15/2016      PT End of Session - 07/15/16 1715    Visit Number 32   Number of Visits 45   Date for PT Re-Evaluation 08/01/16   Authorization Type WC- 10 more visits allowed effective 7/13   PT Start Time 1632   PT Stop Time 1723   PT Time Calculation (min) 51 min   Activity Tolerance Patient tolerated treatment well   Behavior During Therapy [facility] for tasks assessed/performed      Past Medical History:  Diagnosis Date  . Diffuse cystic mastopathy   . GERD (gastroesophageal reflux disease)   . Hemorrhoid   . Hypertension     Past Surgical History:  Procedure Laterality Date  . ANKLE SURGERY Right 2006  . BREAST BIOPSY Bilateral   . CARPAL TUNNEL RELEASE Bilateral   . COLONOSCOPY  2008   Dr. Epifanio Lesches  . MOUTH SURGERY  2010  . TOE SURGERY Left 2006  . TONSILLECTOMY  1959  . TRIGGER FINGER RELEASE Right 2013-2014?    There were no vitals filed for this visit.      Subjective Assessment - 07/15/16 1635    Subjective Can feel upper trap, "it is still a little tender"   Notable soreness on anterior biceps tendon.    Currently in Pain? Yes   Pain Score 2    Pain Location Shoulder   Pain Orientation Right   Pain Descriptors / Indicators Tender   Pain Frequency Constant   Aggravating Factors  reaching and lifting in higher ranges of motion                         [facility] Adult PT Treatment/Exercise - 07/15/16 0001      Shoulder Exercises: Supine   Protraction 20 reps;10 reps   Protraction Weight (lbs) 1lb   Flexion Limitations 2 min with wand 1lb      Shoulder Exercises: Prone   Extension 20 reps   Horizontal ABduction 1 20 reps     Shoulder Exercises: Standing   External Rotation 20 reps;10 reps  1lb     Shoulder Exercises: ROM/Strengthening   UBE (Upper Arm Bike) L 1 x 5 min retro     Moist Heat Therapy   Number Minutes Moist Heat 10 Minutes   Moist Heat Location Shoulder;Cervical     Manual Therapy   Manual Therapy Passive ROM   Soft tissue mobilization trigger point release upper trap and pec minor   Passive ROM flexion, ER, horiz abd with pec stretching                PT Education - 07/15/16 1638    Education provided Yes   Education Details exercise form/rationale   Person(s) Educated Patient   Methods Explanation;Demonstration;Tactile cues;Verbal cues   Comprehension Verbalized understanding;Returned demonstration;Verbal cues required;Tactile cues required;Need further instruction          PT Short Term Goals - 07/01/16 1730      PT SHORT TERM GOAL #1   Title Pt will be I with initial HEP for continued strengthening and mobility   Status Achieved     PT SHORT TERM GOAL #2   Title R shoulder AROM will improve to  90 degrees pain-free for improved overhead reaching activities   Status Achieved     PT SHORT TERM GOAL #3   Title Pt will be able to demo and verbalize proper posture as it related to upper body, shoulder, and cervical spine    Status Achieved           PT Long Term Goals - 06/26/16 1756      PT LONG TERM GOAL #1   Title Patient will improve shoulder AROM to > 140 degrees of flexion, scaption, and abduction for improved ability to perform overhead activities by 8/11   Baseline varies.  27 today   Time 6   Status On-going     PT LONG TERM GOAL #2   Title Patient will demonstrate adequate shoulder ROM and strength to be able to shave and dress independently with pain less than 3/10   Time 6   Period Weeks   Status Unable to assess     PT LONG TERM GOAL #3   Title Pt will be able to use RUE to carry purse/light grocery bag pain <2/10   Time 6   Period Weeks    Status Unable to assess     PT LONG TERM GOAL #4   Title Pt will be able to complete HHC such as mopping and cleaning bathtub pain <2/10   Time 6   Period Weeks   Status Unable to assess     PT LONG TERM GOAL #5   Title Pt would like to be able to use upper extremity to hug without pain   Time 6   Period Weeks   Status On-going               Plan - 07/15/16 1716    Clinical Impression Statement Pt required scapular guiding from PT during prone exercises to avoid overuse of upper traps. Will continue to benefit from coordination training of periscapular region to decrease shoulder hike that is resulting in pain.    PT Next Visit Plan closed chain scapular stabilizers,  Gentle stretches, door stretch   PT Home Exercise Plan mirror ROM, scapular retraction   Consulted and Agree with Plan of Care Patient      Patient will benefit from skilled therapeutic intervention in order to improve the following deficits and impairments:     Visit Diagnosis: Muscle weakness (generalized)  Pain in right shoulder     Problem List Patient Active Problem List   Diagnosis Date Noted  . Fibrocystic breast 02/24/2014    Oumou Smead C. Gustava Berland PT, DPT 07/15/16 5:19 PM   [place] [facility] [address] [place], [place], [address] Phone: [phone]   Fax:  [phone]  Name: LUNELL MCCLEAVE MRN: [id number] Date of Birth: 01/11/1946

## 2016-07-17 ENCOUNTER — Ambulatory Visit: Payer: PRIVATE HEALTH INSURANCE | Admitting: Physical Therapy

## 2016-07-17 ENCOUNTER — Encounter: Payer: Self-pay | Admitting: Physical Therapy

## 2016-07-17 DIAGNOSIS — M6281 Muscle weakness (generalized): Secondary | ICD-10-CM | POA: Diagnosis not present

## 2016-07-17 DIAGNOSIS — M25511 Pain in right shoulder: Secondary | ICD-10-CM

## 2016-07-17 NOTE — Patient Instructions (Signed)
Access Code: [email or web address]  URL: [email or web address]  Date: 07/17/2016  Prepared by: Selinda Eon   Exercises  Standing Single Arm Shoulder PNF D1 Extension - 30 reps - 1 sets - 2 hold - 1x daily - 7x weekly  Shoulder PNF D2 Extension - 30 reps - 1 sets - 2 hold - 1x daily - 7x weekly

## 2016-07-17 NOTE — Therapy (Signed)
[place], [place], [address] Phone: [phone]   Fax:  [phone]  Physical Therapy Treatment  Patient Details  Name: Abigail Moody MRN: [id number] Date of Birth: 06/18/1946 Referring Provider: Netta Cedars  Encounter Date: 07/17/2016      PT End of Session - 07/17/16 1639    Visit Number 33   Number of Visits 37   Date for PT Re-Evaluation 08/01/16   Authorization Type WC- 10 more visits allowed effective 7/13   PT Start Time 1630   PT Stop Time 1724   PT Time Calculation (min) 54 min   Activity Tolerance Patient tolerated treatment well   Behavior During Therapy [facility] for tasks assessed/performed      Past Medical History:  Diagnosis Date  . Diffuse cystic mastopathy   . GERD (gastroesophageal reflux disease)   . Hemorrhoid   . Hypertension     Past Surgical History:  Procedure Laterality Date  . ANKLE SURGERY Right 2006  . BREAST BIOPSY Bilateral   . CARPAL TUNNEL RELEASE Bilateral   . COLONOSCOPY  2008   Dr. Epifanio Lesches  . MOUTH SURGERY  2010  . TOE SURGERY Left 2006  . TONSILLECTOMY  1959  . TRIGGER FINGER RELEASE Right 2013-2014?    There were no vitals filed for this visit.      Subjective Assessment - 07/17/16 1637    Subjective Pt is still limited in overhead reach to shelf at work and cabinets at home. Was able to hold hair dryer in R hand but was unable to reach full head. Improved ability to perform scapular retraction due to decrease in pecotralis pain.    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 Yes   Pain Score 2    Pain Location Shoulder   Pain Orientation Right   Pain Descriptors / Indicators Sore   Aggravating Factors  reaching, unable to carry heavier bags (i.e) grocery bags   Pain Relieving Factors rest            [facility] PT Assessment - 07/17/16 0001      AROM   Right Shoulder Flexion 90 Degrees  attention to  shoulder depression   Right Shoulder ABduction 73 Degrees     PROM   Right Shoulder Flexion 137 Degrees  sharp pain, pt guarding, stretchy end feel   Right Shoulder ABduction 84 Degrees  sharp pain, pt guarding, stretchy end feel   Right Shoulder Internal Rotation 74 Degrees   Right Shoulder External Rotation 28 Degrees  pain     Strength   Right Shoulder Flexion 3-/5  unable to move through full available ROM   Right Shoulder Extension 5/5   Right Shoulder ABduction 3-/5  unable to move through full available ROM   Right Shoulder Internal Rotation 5/5   Right Shoulder External Rotation 4-/5     Palpation   Palpation comment tightness with concordant pain noted R subscap and pec minor                     [facility] Adult PT Treatment/Exercise - 07/17/16 0001      Shoulder Exercises: Supine   Protraction 20 reps;10 reps   Protraction Weight (lbs) 1lb on wand   Flexion Limitations 2 min with wand 1lb    Other Supine Exercises supine rhythmic stabs 3x30s     Shoulder Exercises: Seated   External Rotation 20 reps;10 reps  Theraband Level (Shoulder External Rotation) Level 2 (Red)   Other Seated Exercises AROM D1 & D2 flx/ext     Moist Heat Therapy   Number Minutes Moist Heat 10 Minutes   Moist Heat Location Shoulder;Cervical     Manual Therapy   Soft tissue mobilization trigger point release pec minor and subscap                PT Education - 07/17/16 1715    Education provided Yes   Education Details exercise form/rationale, progression   Person(s) Educated Patient   Methods Explanation;Demonstration;Tactile cues;Verbal cues;Handout   Comprehension Verbalized understanding;Returned demonstration;Verbal cues required;Tactile cues required;Need further instruction          PT Short Term Goals - 07/01/16 1730      PT SHORT TERM GOAL #1   Title Pt will be I with initial HEP for continued strengthening and mobility   Status Achieved     PT  SHORT TERM GOAL #2   Title R shoulder AROM will improve to 90 degrees pain-free for improved overhead reaching activities   Status Achieved     PT SHORT TERM GOAL #3   Title Pt will be able to demo and verbalize proper posture as it related to upper body, shoulder, and cervical spine    Status Achieved           PT Long Term Goals - 07/17/16 1655      PT LONG TERM GOAL #1   Title Patient will improve shoulder AROM to > 140 degrees of flexion, scaption, and abduction for improved ability to perform overhead activities by 8/11   Baseline see objective   Time 6   Period Weeks   Status On-going     PT LONG TERM GOAL #2   Title Patient will demonstrate adequate shoulder ROM and strength to be able to shave and dress independently with pain less than 3/10   Baseline some compensation patterns, up to 4/10   Status On-going     PT LONG TERM GOAL #3   Title Pt will be able to use RUE to carry purse/light grocery bag pain <2/10   Baseline can carry purse but unable to carry grocery bags that are heavy   Status On-going     PT LONG TERM GOAL #4   Title Pt will be able to complete HHC such as mopping and cleaning bathtub pain <2/10   Baseline uses L arm, R arm assist and can scrub lower pieces that are closer   Status On-going     PT LONG TERM GOAL #5   Title Pt would like to be able to use upper extremity to hug without pain   Baseline only able to reach about waist height   Status On-going               Plan - 07/17/16 1659    Clinical Impression Statement Pt continues to make progress toward long term goals but continues to have limitations in range of motion that is limiting functional ability. PROM is higher than active which is improving. Pt cont to complain of sharp pain at end range of both active and passive motion with stretchy end feels. Pt will continue to benefit from skilled PT in order to improve functional ROM and use of dominant arm to return to PLOF.   Rehab  Potential Good   PT Treatment/Interventions Electrical Stimulation;Cryotherapy;Moist Heat;Ultrasound;Manual techniques;Therapeutic activities;Therapeutic exercise;ADLs/Self Care Home Management;Iontophoresis 4mg /ml Dexamethasone;Functional mobility training;Patient/family education;Neuromuscular re-education;Taping;Dry needling;Passive range  of motion   PT Next Visit Plan supine AROM challenges, scapular stabilization   PT Home Exercise Plan mirror ROM, scapular retraction   Consulted and Agree with Plan of Care Patient      Patient will benefit from skilled therapeutic intervention in order to improve the following deficits and impairments:  Impaired flexibility, Decreased strength, Decreased range of motion, Decreased activity tolerance, Improper body mechanics, Pain  Visit Diagnosis: Muscle weakness (generalized)  Pain in right shoulder     Problem List Patient Active Problem List   Diagnosis Date Noted  . Fibrocystic breast 02/24/2014   Meily Glowacki C. Danie Hannig PT, DPT 07/17/16 5:18 PM   [place], [place], [address] Phone: [phone]   Fax:  [phone]  Name: Abigail Moody MRN: [id number] Date of Birth: 03/16/1946

## 2016-07-22 ENCOUNTER — Ambulatory Visit: Payer: PRIVATE HEALTH INSURANCE | Attending: Orthopedic Surgery | Admitting: Physical Therapy

## 2016-07-22 DIAGNOSIS — M6281 Muscle weakness (generalized): Secondary | ICD-10-CM | POA: Diagnosis present

## 2016-07-22 DIAGNOSIS — M75101 Unspecified rotator cuff tear or rupture of right shoulder, not specified as traumatic: Secondary | ICD-10-CM | POA: Insufficient documentation

## 2016-07-22 DIAGNOSIS — M25511 Pain in right shoulder: Secondary | ICD-10-CM | POA: Diagnosis present

## 2016-07-22 DIAGNOSIS — M25611 Stiffness of right shoulder, not elsewhere classified: Secondary | ICD-10-CM | POA: Insufficient documentation

## 2016-07-22 NOTE — Therapy (Signed)
[place], [place], [address] Phone: [phone]   Fax:  [phone]  Physical Therapy Treatment  Patient Details  Name: Abigail Moody MRN: [id number] Date of Birth: 02/11/1946 Referring Provider: Netta Cedars  Encounter Date: 07/22/2016      PT End of Session - 07/22/16 1636    Visit Number 34   Number of Visits 32   Date for PT Re-Evaluation 08/01/16   Authorization Type WC- 10 more visits allowed effective 7/13   PT Start Time 0433   PT Stop Time 0530   PT Time Calculation (min) 57 min      Past Medical History:  Diagnosis Date  . Diffuse cystic mastopathy   . GERD (gastroesophageal reflux disease)   . Hemorrhoid   . Hypertension     Past Surgical History:  Procedure Laterality Date  . ANKLE SURGERY Right 2006  . BREAST BIOPSY Bilateral   . CARPAL TUNNEL RELEASE Bilateral   . COLONOSCOPY  2008   Dr. Epifanio Lesches  . MOUTH SURGERY  2010  . TOE SURGERY Left 2006  . TONSILLECTOMY  1959  . TRIGGER FINGER RELEASE Right 2013-2014?    There were no vitals filed for this visit.      Subjective Assessment - 07/22/16 1635    Subjective up tp 3/10 with movement    Currently in Pain? No/denies                         [facility] Adult PT Treatment/Exercise - 07/22/16 0001      Shoulder Exercises: Supine   Protraction Both;20 reps   Protraction Weight (lbs) 2lb on wand   Flexion Limitations 2 min with wand 2lb small ROM above chest   Other Supine Exercises supine pullovers with cane , right single arm flexion x10,      Shoulder Exercises: Sidelying   External Rotation 20 reps   ABduction 10 reps     Shoulder Exercises: Pulleys   Flexion 3 minutes     Shoulder Exercises: ROM/Strengthening   UBE (Upper Arm Bike) L 1 x 5 min retro   Other ROM/Strengthening Exercises low shelf cabinet reach 10 x 2, physioball rolls in table AAROM 10 x2 then single arm x 20, horizontal  abduction 10 x 2 single arm                  PT Short Term Goals - 07/01/16 1730      PT SHORT TERM GOAL #1   Title Pt will be I with initial HEP for continued strengthening and mobility   Status Achieved     PT SHORT TERM GOAL #2   Title R shoulder AROM will improve to 90 degrees pain-free for improved overhead reaching activities   Status Achieved     PT SHORT TERM GOAL #3   Title Pt will be able to demo and verbalize proper posture as it related to upper body, shoulder, and cervical spine    Status Achieved           PT Long Term Goals - 07/17/16 1655      PT LONG TERM GOAL #1   Title Patient will improve shoulder AROM to > 140 degrees of flexion, scaption, and abduction for improved ability to perform overhead activities by 8/11   Baseline see objective   Time 6   Period Weeks   Status On-going     PT LONG TERM GOAL #2  Title Patient will demonstrate adequate shoulder ROM and strength to be able to shave and dress independently with pain less than 3/10   Baseline some compensation patterns, up to 4/10   Status On-going     PT LONG TERM GOAL #3   Title Pt will be able to use RUE to carry purse/light grocery bag pain <2/10   Baseline can carry purse but unable to carry grocery bags that are heavy   Status On-going     PT LONG TERM GOAL #4   Title Pt will be able to complete HHC such as mopping and cleaning bathtub pain <2/10   Baseline uses L arm, R arm assist and can scrub lower pieces that are closer   Status On-going     PT LONG TERM GOAL #5   Title Pt would like to be able to use upper extremity to hug without pain   Baseline only able to reach about waist height   Status On-going               Plan - 07/22/16 1723    Clinical Impression Statement Pt reports MD visit went well and presents order to continue PT. Focused AROM, AAROM exercises to tolerance. Pt reports some mild increased pain.    PT Next Visit Plan supine AROM challenges,  scapular stabilization      Patient will benefit from skilled therapeutic intervention in order to improve the following deficits and impairments:  Impaired flexibility, Decreased strength, Decreased range of motion, Decreased activity tolerance, Improper body mechanics, Pain  Visit Diagnosis: Muscle weakness (generalized)  Pain in right shoulder  RCT (rotator cuff tear), right  Stiffness of right shoulder, not elsewhere classified     Problem List Patient Active Problem List   Diagnosis Date Noted  . Fibrocystic breast 02/24/2014    Dorene Ar, PTA 07/22/2016, 5:25 PM  [facility] [address] [place], [place], [address] Phone: [phone]   Fax:  [phone]  Name: Abigail Moody MRN: [id number] Date of Birth: 06/05/1946

## 2016-07-24 ENCOUNTER — Ambulatory Visit: Payer: PRIVATE HEALTH INSURANCE | Admitting: Physical Therapy

## 2016-07-24 DIAGNOSIS — M6281 Muscle weakness (generalized): Secondary | ICD-10-CM

## 2016-07-24 DIAGNOSIS — M25611 Stiffness of right shoulder, not elsewhere classified: Secondary | ICD-10-CM

## 2016-07-24 DIAGNOSIS — M25511 Pain in right shoulder: Secondary | ICD-10-CM

## 2016-07-24 DIAGNOSIS — M75101 Unspecified rotator cuff tear or rupture of right shoulder, not specified as traumatic: Secondary | ICD-10-CM

## 2016-07-24 NOTE — Therapy (Signed)
[place], [place], [address] Phone: [phone]   Fax:  [phone]  Physical Therapy Treatment  Patient Details  Name: Abigail Moody MRN: [id number] Date of Birth: May 01, 1946 Referring Provider: Netta Cedars  Encounter Date: 07/24/2016      PT End of Session - 07/24/16 1634    Visit Number 35   Number of Visits 83   Date for PT Re-Evaluation 08/01/16   Authorization Type WC- 10 more visits allowed effective 7/13   PT Start Time 0430   PT Stop Time 0530   PT Time Calculation (min) 60 min      Past Medical History:  Diagnosis Date  . Diffuse cystic mastopathy   . GERD (gastroesophageal reflux disease)   . Hemorrhoid   . Hypertension     Past Surgical History:  Procedure Laterality Date  . ANKLE SURGERY Right 2006  . BREAST BIOPSY Bilateral   . CARPAL TUNNEL RELEASE Bilateral   . COLONOSCOPY  2008   Dr. Epifanio Lesches  . MOUTH SURGERY  2010  . TOE SURGERY Left 2006  . TONSILLECTOMY  1959  . TRIGGER FINGER RELEASE Right 2013-2014?    There were no vitals filed for this visit.      Subjective Assessment - 07/24/16 1634    Subjective I was sore. Weather makes it painful and achy,    Currently in Pain? Yes   Pain Score 3    Pain Location Shoulder   Pain Orientation Right   Pain Descriptors / Indicators Sore   Aggravating Factors  reaching   Pain Relieving Factors rest                         [facility] Adult PT Treatment/Exercise - 07/24/16 0001      Shoulder Exercises: Supine   Protraction Both;20 reps   Protraction Weight (lbs) 2lb on wand, also single arm AROM x 10   Flexion Limitations 2 min with wand 2lb small ROM above chest   Other Supine Exercises supine pullovers with cane , right single arm flexion x10,      Shoulder Exercises: Sidelying   External Rotation 20 reps   ABduction 10 reps     Shoulder Exercises: Standing   External Rotation 20 reps   Theraband Level (Shoulder External Rotation) Level 1 (Yellow)   Extension 20 reps   Theraband Level (Shoulder Extension) Level 2 (Red)   Row 20 reps   Theraband Level (Shoulder Row) Level 2 (Red)   Other Standing Exercises physioball rolls up wall AAROM x 20     Shoulder Exercises: Pulleys   Flexion 3 minutes     Shoulder Exercises: ROM/Strengthening   UBE (Upper Arm Bike) L1 x 5 min retro, 2 min forward   Other ROM/Strengthening Exercises  physioball rolls in table AAROM 10 x2 then single arm x 20, horizontal abduction 10 x 2 single arm     Moist Heat Therapy   Number Minutes Moist Heat 15 Minutes   Moist Heat Location Shoulder                  PT Short Term Goals - 07/01/16 1730      PT SHORT TERM GOAL #1   Title Pt will be I with initial HEP for continued strengthening and mobility   Status Achieved     PT SHORT TERM GOAL #2   Title R shoulder AROM will improve to 90 degrees pain-free for  improved overhead reaching activities   Status Achieved     PT SHORT TERM GOAL #3   Title Pt will be able to demo and verbalize proper posture as it related to upper body, shoulder, and cervical spine    Status Achieved           PT Long Term Goals - 07/17/16 1655      PT LONG TERM GOAL #1   Title Patient will improve shoulder AROM to > 140 degrees of flexion, scaption, and abduction for improved ability to perform overhead activities by 8/11   Baseline see objective   Time 6   Period Weeks   Status On-going     PT LONG TERM GOAL #2   Title Patient will demonstrate adequate shoulder ROM and strength to be able to shave and dress independently with pain less than 3/10   Baseline some compensation patterns, up to 4/10   Status On-going     PT LONG TERM GOAL #3   Title Pt will be able to use RUE to carry purse/light grocery bag pain <2/10   Baseline can carry purse but unable to carry grocery bags that are heavy   Status On-going     PT LONG TERM GOAL #4   Title  Pt will be able to complete HHC such as mopping and cleaning bathtub pain <2/10   Baseline uses L arm, R arm assist and can scrub lower pieces that are closer   Status On-going     PT LONG TERM GOAL #5   Title Pt would like to be able to use upper extremity to hug without pain   Baseline only able to reach about waist height   Status On-going               Plan - 07/24/16 1717    Clinical Impression Statement Soreness after last visit. Good tolerance to exercises today. She reports using her RUE for functional tasks more often including reaching into cabinets and locking dead bolts at shoulder height.    PT Next Visit Plan supine AROM challenges, scapular stabilization      Patient will benefit from skilled therapeutic intervention in order to improve the following deficits and impairments:  Impaired flexibility, Decreased strength, Decreased range of motion, Decreased activity tolerance, Improper body mechanics, Pain  Visit Diagnosis: Muscle weakness (generalized)  Pain in right shoulder  RCT (rotator cuff tear), right  Stiffness of right shoulder, not elsewhere classified     Problem List Patient Active Problem List   Diagnosis Date Noted  . Fibrocystic breast 02/24/2014    Dorene Ar, PTA 07/24/2016, 5:22 PM  [facility] [address] [place], [place], [address] Phone: [phone]   Fax:  [phone]  Name: Abigail Moody MRN: [id number] Date of Birth: [birth year]

## 2016-07-29 ENCOUNTER — Encounter: Payer: PRIVATE HEALTH INSURANCE | Admitting: Physical Therapy

## 2016-07-31 ENCOUNTER — Ambulatory Visit: Payer: PRIVATE HEALTH INSURANCE | Admitting: Physical Therapy

## 2016-07-31 DIAGNOSIS — M25511 Pain in right shoulder: Secondary | ICD-10-CM

## 2016-07-31 DIAGNOSIS — M6281 Muscle weakness (generalized): Secondary | ICD-10-CM | POA: Diagnosis not present

## 2016-07-31 NOTE — Therapy (Signed)
[place], [place], [address] Phone: [phone]   Fax:  [phone]  Physical Therapy Treatment  Patient Details  Name: Abigail Moody MRN: [id number] Date of Birth: [birth year] Referring Provider: Netta Cedars  Encounter Date: 07/31/2016      PT End of Session - 07/31/16 1637    Visit Number 36   Number of Visits 74   Date for PT Re-Evaluation 08/01/16   Authorization Type WC- 10 more visits allowed effective 7/13   PT Start Time 1634   PT Stop Time 1724   PT Time Calculation (min) 50 min   Activity Tolerance Patient tolerated treatment well   Behavior During Therapy [facility] for tasks assessed/performed      Past Medical History:  Diagnosis Date  . Diffuse cystic mastopathy   . GERD (gastroesophageal reflux disease)   . Hemorrhoid   . Hypertension     Past Surgical History:  Procedure Laterality Date  . ANKLE SURGERY Right 2006  . BREAST BIOPSY Bilateral   . CARPAL TUNNEL RELEASE Bilateral   . COLONOSCOPY  2008   Dr. Epifanio Lesches  . MOUTH SURGERY  2010  . TOE SURGERY Left 2006  . TONSILLECTOMY  1959  . TRIGGER FINGER RELEASE Right 2013-2014?    There were no vitals filed for this visit.      Subjective Assessment - 07/31/16 1636    Subjective "feels like it's knotted up again"  reports she has been doing exercises. Has been trying to use arm in more functional movements throughout her day.    Currently in Pain? Yes   Pain Score 4    Pain Location Shoulder   Pain Orientation Right   Pain Descriptors / Indicators Sore   Aggravating Factors  higher ranges of motion, repetitive reaching                         [facility] Adult PT Treatment/Exercise - 07/31/16 0001      Shoulder Exercises: Standing   External Rotation 20 reps   Theraband Level (Shoulder External Rotation) Level 3 (Green)   Other Standing Exercises tennis ball bounce 2'     Shoulder Exercises: Pulleys   Flexion 3 minutes     Shoulder Exercises: ROM/Strengthening   UBE (Upper Arm Bike) L1 5'/5'     Shoulder Exercises: Stretch   Other Shoulder Stretches physioball roll up wall x10     Moist Heat Therapy   Number Minutes Moist Heat 10 Minutes   Moist Heat Location Shoulder     Manual Therapy   Manual Therapy Taping   Soft tissue mobilization trigger point release upper trap, scalenes, subscap   Scapular Mobilization guided scapular movement through flexion   Kinesiotex Facilitate Muscle     Kinesiotix   Facilitate Muscle  scapular retraction & depression                  PT Short Term Goals - 07/01/16 1730      PT SHORT TERM GOAL #1   Title Pt will be I with initial HEP for continued strengthening and mobility   Status Achieved     PT SHORT TERM GOAL #2   Title R shoulder AROM will improve to 90 degrees pain-free for improved overhead reaching activities   Status Achieved     PT SHORT TERM GOAL #3   Title Pt will be able to demo and verbalize proper posture as it related to  upper body, shoulder, and cervical spine    Status Achieved           PT Long Term Goals - 07/17/16 1655      PT LONG TERM GOAL #1   Title Patient will improve shoulder AROM to > 140 degrees of flexion, scaption, and abduction for improved ability to perform overhead activities by 8/11   Baseline see objective   Time 6   Period Weeks   Status On-going     PT LONG TERM GOAL #2   Title Patient will demonstrate adequate shoulder ROM and strength to be able to shave and dress independently with pain less than 3/10   Baseline some compensation patterns, up to 4/10   Status On-going     PT LONG TERM GOAL #3   Title Pt will be able to use RUE to carry purse/light grocery bag pain <2/10   Baseline can carry purse but unable to carry grocery bags that are heavy   Status On-going     PT LONG TERM GOAL #4   Title Pt will be able to complete HHC such as mopping and cleaning bathtub pain  <2/10   Baseline uses L arm, R arm assist and can scrub lower pieces that are closer   Status On-going     PT LONG TERM GOAL #5   Title Pt would like to be able to use upper extremity to hug without pain   Baseline only able to reach about waist height   Status On-going               Plan - 07/31/16 1721    Clinical Impression Statement Notable tightness in subscapularis was restricting appopriate scapular movement. Was able to obtain active ROM of 117 in flexion today with some discomfort. Is demonstrating endurance improvements and no longer has to rest her elbow while fixing her hair.    PT Next Visit Plan supine AROM challenges, scapular stabilization   PT Home Exercise Plan mirror ROM, scapular retraction   Consulted and Agree with Plan of Care Patient      Patient will benefit from skilled therapeutic intervention in order to improve the following deficits and impairments:     Visit Diagnosis: Muscle weakness (generalized)  Pain in right shoulder     Problem List Patient Active Problem List   Diagnosis Date Noted  . Fibrocystic breast 02/24/2014    Savaya Hakes C. Verita Kuroda PT, DPT 07/31/16 5:26 PM   [facility] [address] [place], [place], [address] Phone: [phone]   Fax:  [phone]  Name: Abigail Moody MRN: [id number] Date of Birth: 09/25/1946

## 2016-08-05 ENCOUNTER — Ambulatory Visit: Payer: PRIVATE HEALTH INSURANCE | Admitting: Physical Therapy

## 2016-08-06 ENCOUNTER — Observation Stay
Admission: EM | Admit: 2016-08-06 | Discharge: 2016-08-07 | Disposition: A | Discharge status: Home / Self Care | Payer: PPO | Attending: Internal Medicine | Admitting: Internal Medicine

## 2016-08-06 ENCOUNTER — Encounter: Payer: Self-pay | Admitting: Emergency Medicine

## 2016-08-06 ENCOUNTER — Emergency Department: Payer: PPO

## 2016-08-06 DIAGNOSIS — R413 Other amnesia: Secondary | ICD-10-CM | POA: Diagnosis present

## 2016-08-06 DIAGNOSIS — R29810 Facial weakness: Secondary | ICD-10-CM | POA: Diagnosis not present

## 2016-08-06 DIAGNOSIS — I371 Nonrheumatic pulmonary valve insufficiency: Secondary | ICD-10-CM | POA: Insufficient documentation

## 2016-08-06 DIAGNOSIS — Z7982 Long term (current) use of aspirin: Secondary | ICD-10-CM | POA: Diagnosis not present

## 2016-08-06 DIAGNOSIS — Z881 Allergy status to other antibiotic agents status: Secondary | ICD-10-CM | POA: Diagnosis not present

## 2016-08-06 DIAGNOSIS — K219 Gastro-esophageal reflux disease without esophagitis: Secondary | ICD-10-CM | POA: Diagnosis not present

## 2016-08-06 DIAGNOSIS — G459 Transient cerebral ischemic attack, unspecified: Principal | ICD-10-CM | POA: Insufficient documentation

## 2016-08-06 DIAGNOSIS — Z79899 Other long term (current) drug therapy: Secondary | ICD-10-CM | POA: Insufficient documentation

## 2016-08-06 DIAGNOSIS — D72829 Elevated white blood cell count, unspecified: Secondary | ICD-10-CM | POA: Diagnosis not present

## 2016-08-06 DIAGNOSIS — R42 Dizziness and giddiness: Secondary | ICD-10-CM | POA: Diagnosis not present

## 2016-08-06 DIAGNOSIS — I081 Rheumatic disorders of both mitral and tricuspid valves: Secondary | ICD-10-CM | POA: Diagnosis not present

## 2016-08-06 DIAGNOSIS — E876 Hypokalemia: Secondary | ICD-10-CM | POA: Diagnosis not present

## 2016-08-06 DIAGNOSIS — Z888 Allergy status to other drugs, medicaments and biological substances status: Secondary | ICD-10-CM | POA: Insufficient documentation

## 2016-08-06 DIAGNOSIS — I10 Essential (primary) hypertension: Secondary | ICD-10-CM | POA: Diagnosis not present

## 2016-08-06 DIAGNOSIS — R51 Headache: Secondary | ICD-10-CM | POA: Diagnosis not present

## 2016-08-06 LAB — COMPREHENSIVE METABOLIC PANEL
ALT: 17 U/L (ref 14–54)
AST: 19 U/L (ref 15–41)
Albumin: 3.8 g/dL (ref 3.5–5.0)
Alkaline Phosphatase: 80 U/L (ref 38–126)
Anion gap: 9 (ref 5–15)
BILIRUBIN TOTAL: 0.5 mg/dL (ref 0.3–1.2)
BUN: 18 mg/dL (ref 6–20)
CHLORIDE: 108 mmol/L (ref 101–111)
CO2: 25 mmol/L (ref 22–32)
CREATININE: 0.97 mg/dL (ref 0.44–1.00)
Calcium: 9.1 mg/dL (ref 8.9–10.3)
GFR calc Af Amer: >60 mL/min (ref >60)
GFR, EST NON AFRICAN AMERICAN: 58 mL/min — AB (ref >60)
Glucose, Bld: 95 mg/dL (ref 65–99)
Potassium: 3.1 mmol/L — ABNORMAL LOW (ref 3.5–5.1)
Sodium: 142 mmol/L (ref 135–145)
TOTAL PROTEIN: 7.1 g/dL (ref 6.5–8.1)

## 2016-08-06 LAB — CBC
HEMATOCRIT: 42.8 % (ref 35.0–47.0)
HEMOGLOBIN: 14.1 g/dL (ref 12.0–16.0)
MCH: 30.8 pg (ref 26.0–34.0)
MCHC: 33 g/dL (ref 32.0–36.0)
MCV: 93.5 fL (ref 80.0–100.0)
Platelets: 307 10*3/uL (ref 150–440)
RBC: 4.58 MIL/uL (ref 3.80–5.20)
RDW: 14.1 % (ref 11.5–14.5)
WBC: 11.3 10*3/uL — AB (ref 3.6–11.0)

## 2016-08-06 LAB — DIFFERENTIAL
BASOS ABS: 0.1 10*3/uL (ref 0–0.1)
Basophils Relative: 1 %
Eosinophils Absolute: 0 10*3/uL (ref 0–0.7)
Eosinophils Relative: 0 %
LYMPHS ABS: 2.8 10*3/uL (ref 1.0–3.6)
LYMPHS PCT: 25 %
MONOS PCT: 10 %
Monocytes Absolute: 1.1 10*3/uL — ABNORMAL HIGH (ref 0.2–0.9)
NEUTROS ABS: 7.3 10*3/uL — AB (ref 1.4–6.5)
Neutrophils Relative %: 64 %

## 2016-08-06 LAB — APTT: APTT: 29 s (ref 24–36)

## 2016-08-06 LAB — PROTIME-INR
INR: 0.92
Prothrombin Time: 12.3 seconds (ref 11.4–15.2)

## 2016-08-06 LAB — TROPONIN I: TROPONIN I: 0.04 ng/mL — AB (ref <0.03)

## 2016-08-06 MED ORDER — POTASSIUM CHLORIDE CRYS ER 20 MEQ PO TBCR
40.0000 meq | EXTENDED_RELEASE_TABLET | ORAL | Status: AC
  Administered 2016-08-06 – 2016-08-07 (×2): 40 meq via ORAL
  Filled 2016-08-06 (×2): qty 2

## 2016-08-06 MED ORDER — STROKE: EARLY STAGES OF RECOVERY BOOK
Freq: Once | Status: AC
  Administered 2016-08-06: 23:00:00

## 2016-08-06 MED ORDER — ENOXAPARIN SODIUM 40 MG/0.4ML ~~LOC~~ SOLN
40.0000 mg | Freq: Every day | SUBCUTANEOUS | Status: DC
  Administered 2016-08-06: 23:00:00 40 mg via SUBCUTANEOUS
  Filled 2016-08-06: qty 0.4

## 2016-08-06 MED ORDER — PANTOPRAZOLE SODIUM 40 MG PO TBEC
40.0000 mg | DELAYED_RELEASE_TABLET | Freq: Every day | ORAL | Status: DC
  Administered 2016-08-07: 11:00:00 40 mg via ORAL
  Filled 2016-08-06: qty 1

## 2016-08-06 MED ORDER — ASPIRIN 325 MG PO TABS
325.0000 mg | ORAL_TABLET | Freq: Every day | ORAL | Status: DC
  Administered 2016-08-06 – 2016-08-07 (×2): 325 mg via ORAL
  Filled 2016-08-06 (×2): qty 1

## 2016-08-06 MED ORDER — ADULT MULTIVITAMIN W/MINERALS CH
1.0000 | ORAL_TABLET | Freq: Every day | ORAL | Status: DC
  Administered 2016-08-06 – 2016-08-07 (×2): 1 via ORAL
  Filled 2016-08-06 (×2): qty 1

## 2016-08-06 MED ORDER — CALCIUM CARBONATE-VITAMIN D 500-200 MG-UNIT PO TABS
1.0000 | ORAL_TABLET | Freq: Two times a day (BID) | ORAL | Status: DC
  Administered 2016-08-07: 1 via ORAL
  Filled 2016-08-06 (×2): qty 1

## 2016-08-06 MED ORDER — MELOXICAM 7.5 MG PO TABS
15.0000 mg | ORAL_TABLET | Freq: Every day | ORAL | Status: DC
  Administered 2016-08-06 – 2016-08-07 (×2): 15 mg via ORAL
  Filled 2016-08-06 (×2): qty 2

## 2016-08-06 MED ORDER — ESCITALOPRAM OXALATE 10 MG PO TABS
10.0000 mg | ORAL_TABLET | Freq: Every day | ORAL | Status: DC
  Administered 2016-08-06 – 2016-08-07 (×2): 10 mg via ORAL
  Filled 2016-08-06 (×2): qty 1

## 2016-08-06 MED ORDER — ATORVASTATIN CALCIUM 20 MG PO TABS
10.0000 mg | ORAL_TABLET | Freq: Every day | ORAL | Status: DC
  Administered 2016-08-06 – 2016-08-07 (×2): 10 mg via ORAL
  Filled 2016-08-06 (×2): qty 1

## 2016-08-06 NOTE — H&P (Signed)
[facility] [place] NAME: Abigail Moody    MR#:  [id number]  DATE OF BIRTH:  06/27/1946  DATE OF ADMISSION:  08/06/2016  PRIMARY CARE PHYSICIAN: Adin Hector, MD   REQUESTING/REFERRING PHYSICIAN: Dr. Brenton Grills  CHIEF COMPLAINT: Right facial droop, memory loss.    Chief Complaint  Patient presents with  . Facial Droop  . Memory Loss    HISTORY OF PRESENT ILLNESS:  Abigail Moody  is a 70 y.o. female with a known history of Essential hypertension brought in for memory loss, facial droop to the right side since yesterday morning. Patient has been having headache since yesterday and is now on vision changes. Patient took some aspirin yesterday and slept. No dizziness but complains of unstable gait since yesterday. Unable to remember coworkers names also.unable to remember pcp name also patient denies any weakness of hands or legs. No slurred speech. 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  PAST MEDICAL HISTORY:   Past Medical History:  Diagnosis Date  . Diffuse cystic mastopathy   . GERD (gastroesophageal reflux disease)   . Hemorrhoid   . Hypertension     PAST SURGICAL HISTOIRY:   Past Surgical History:  Procedure Laterality Date  . ANKLE SURGERY Right 2006  . BREAST BIOPSY Bilateral   . CARPAL TUNNEL RELEASE Bilateral   . COLONOSCOPY  2008   Dr. Epifanio Lesches  . MOUTH SURGERY  2010  . TOE SURGERY Left 2006  . TONSILLECTOMY  1959  . TRIGGER FINGER RELEASE Right 2013-2014?    SOCIAL HISTORY:   Social History  Substance Use Topics  . Smoking status: Never Smoker  . Smokeless tobacco: Never Used  . Alcohol use Yes    FAMILY HISTORY:   Family History  Problem Relation Age of Onset  . Cancer Father     prostate  . Cancer Brother     half brother/lung  . Breast cancer Other     DRUG ALLERGIES:   Allergies  Allergen Reactions  . Erythromycin Itching  . Levaquin [Levofloxacin] Other (See  Comments)    Lethargic, no energy, cannot function for daily activities per patient.     REVIEW OF SYSTEMS:  CONSTITUTIONAL: No fever, fatigue or weakness.  EYES: No blurred or double vision.  EARS, NOSE, AND THROAT: No tinnitus or ear pain.  RESPIRATORY: No cough, shortness of breath, wheezing or hemoptysis.  CARDIOVASCULAR: No chest pain, orthopnea, edema.  GASTROINTESTINAL: No nausea, vomiting, diarrhea or abdominal pain.  GENITOURINARY: No dysuria, hematuria.  ENDOCRINE: No polyuria, nocturia,  HEMATOLOGY: No anemia, easy bruising or bleeding SKIN: No rash or lesion. MUSCULOSKELETAL: No joint pain or arthritis.   NEUROLOGIC: No tingling, numbness, weakness. . Short-term memory loss since yesterday, right facial droop resolved at this time. PSYCHIATRY: No anxiety or depression. Patient is under stress because she is taking care of 70 year old demented mother.  MEDICATIONS AT HOME:   Prior to Admission medications   Medication Sig Start Date End Date Taking? Authorizing Provider  amLODipine (NORVASC) 5 MG tablet Take 5 mg by mouth daily.    Historical Provider, MD  aspirin 325 MG tablet Take 325 mg by mouth daily.    Historical Provider, MD  atorvastatin (LIPITOR) 10 MG tablet Take 10 mg by mouth daily.    Historical Provider, MD  Biotin 10 MG TABS Take by mouth.    Historical Provider, MD  calcium-vitamin D 250-100 MG-UNIT per tablet Take 1 tablet by mouth 2 (two)  times daily.    Historical Provider, MD  escitalopram (LEXAPRO) 10 MG tablet Take 10 mg by mouth daily.    Historical Provider, MD  ferrous sulfate 325 (65 FE) MG tablet Take 325 mg by mouth daily with breakfast.    Historical Provider, MD  fluticasone (FLONASE) 50 MCG/ACT nasal spray  02/12/14   Historical Provider, MD  hydrochlorothiazide (HYDRODIURIL) 25 MG tablet Take 25 mg by mouth daily.    Historical Provider, MD  meloxicam (MOBIC) 15 MG tablet Take 15 mg by mouth daily.    Historical Provider, MD  Multiple  Vitamins-Minerals (MULTIVITAMIN WITH MINERALS) tablet Take 1 tablet by mouth daily.    Historical Provider, MD  omeprazole (PRILOSEC) 20 MG capsule Take 20 mg by mouth daily.    Historical Provider, MD  PROAIR HFA 108 (90 BASE) MCG/ACT inhaler  01/28/14   Historical Provider, MD      VITAL SIGNS:  Blood pressure (!) 152/86, pulse 89, temperature 98.3 F (36.8 C), temperature source Oral, resp. rate 16, height 5\' 4"  (1.626 m), weight 99.8 kg (220 lb), SpO2 98 %.  PHYSICAL EXAMINATION:  GENERAL:  70 y.o.-year-old patient lying in the bed with no acute distress.  EYES: Pupils equal, round, reactive to light and accommodation. No scleral icterus. Extraocular muscles intact.  HEENT: Head atraumatic, normocephalic. Oropharynx and nasopharynx clear.  NECK:  Supple, no jugular venous distention. No thyroid enlargement, no tenderness.  LUNGS: Normal breath sounds bilaterally, no wheezing, rales,rhonchi or crepitation. No use of accessory muscles of respiration.  CARDIOVASCULAR: S1, S2 normal. No murmurs, rubs, or gallops.  ABDOMEN: Soft, nontender, nondistended. Bowel sounds present. No organomegaly or mass.  EXTREMITIES: No pedal edema, cyanosis, or clubbing.  NEUROLOGIC: Cranial nerves II through XII are intact. Muscle strength 5/5 in all extremities. Sensation intact. Gait not checked.  PSYCHIATRIC: The patient is alert and oriented x 3.  SKIN: No obvious rash, lesion, or ulcer.   LABORATORY PANEL:   CBC  Recent Labs Lab 08/06/16 1653  WBC 11.3*  HGB 14.1  HCT 42.8  PLT 307   ------------------------------------------------------------------------------------------------------------------  Chemistries   Recent Labs Lab 08/06/16 1653  NA 142  K 3.1*  CL 108  CO2 25  GLUCOSE 95  BUN 18  CREATININE 0.97  CALCIUM 9.1  AST 19  ALT 17  ALKPHOS 80  BILITOT 0.5    ------------------------------------------------------------------------------------------------------------------  Cardiac Enzymes  Recent Labs Lab 08/06/16 1653  TROPONINI 0.04*   ------------------------------------------------------------------------------------------------------------------  RADIOLOGY:  Ct Head Wo Contrast  Result Date: 08/06/2016 CLINICAL DATA:  70 year old female with sudden memory loss since yesterday morning. Right facial droop. Headache and nausea yesterday morning. Initial encounter. EXAM: CT HEAD WITHOUT CONTRAST TECHNIQUE: Contiguous axial images were obtained from the base of the skull through the vertex without intravenous contrast. COMPARISON:  Brain MRI and noncontrast head CT 04/02/2006. FINDINGS: Brain: Cerebral volume remains normal for age. No midline shift, ventriculomegaly, mass effect, evidence of mass lesion, intracranial hemorrhage or evidence of cortically based acute infarction. Gray-white matter differentiation is within normal limits throughout the brain. Vascular: Stable CT appearance of intracranial vasculature. No suspicious intracranial vascular hyperdensity. Skull:  No acute osseous abnormality identified. Sinuses/Orbits: Visualized paranasal sinuses and mastoids are stable and well pneumatized. Other: Visualized orbits and scalp soft tissues are within normal limits. IMPRESSION: Normal for age non contrast CT appearance of the brain. Electronically Signed   By: Genevie Ann M.D.   On: 08/06/2016 16:43    EKG:   Orders placed or performed  during the hospital encounter of 08/06/16  . ED EKG  . ED EKG   Normal sinus rhythm 74 bpm. Impression in V1, V2. IMPRESSION AND PLAN:   Short-term memory loss, with a right facial droop symptoms consistent with acute ischemic stroke but CAT scan of the head is normal. Admitted to hospitalist service, TIA pathway followed. Continue aspirin, check MRI of the brain, ultrasound of carotids, echocardiogram,  neuro checks frequently, and neurology consult in the morning, check fasting lipids, hemoglobin A1c, monitor on off unit telemetry for any arrhythmia. #2 hypokalemia: Likely due to diuretics: Replace the potassium.  Slightly elevated troponins without chest pain. EKG unremarkable. Follow the trend of troponins, follow echocardiogram.  Discussed with the patient, patient's husband. That all the questions.  Leukocytosis likely due to stress-induced.  All the records are reviewed and case discussed with ED provider. Management plans discussed with the patient, family and they are in agreement.  CODE STATUS: full  TOTAL TIME TAKING CARE OF THIS PATIENT:55 minutes.    Epifanio Lesches M.D on 08/06/2016 at 7:51 PM  Between 7am to 6pm - Pager - [phone]  After 6pm go to [email or web address] - password [email or web address] [facility]  Office  [phone]  CC: Primary care physician; Adin Hector, MD  Note: This dictation was prepared with Dragon dictation along with smaller phrase technology. Any transcriptional errors that result from this process are unintentional.

## 2016-08-06 NOTE — ED Provider Notes (Signed)
[facility] Emergency Department Provider Note  ____________________________________________  Time seen: Approximately 7:21 PM  I have reviewed the triage vital signs and the nursing notes.   HISTORY  Chief Complaint Facial Droop and Memory Loss    HPI Abigail Moody is a 70 y.o. female reports memory loss and some slight drooping of the right side of her face since yesterday morning. No numbness tingling weakness or vision change. She has a right frontal headache as well. Denies history of strokes. No chest pain shortness of breath or palpitations. No syncope.     Past Medical History:  Diagnosis Date  . Diffuse cystic mastopathy   . GERD (gastroesophageal reflux disease)   . Hemorrhoid   . Hypertension      Patient Active Problem List   Diagnosis Date Noted  . Fibrocystic breast 02/24/2014     Past Surgical History:  Procedure Laterality Date  . ANKLE SURGERY Right 2006  . BREAST BIOPSY Bilateral   . CARPAL TUNNEL RELEASE Bilateral   . COLONOSCOPY  2008   Dr. Epifanio Lesches  . MOUTH SURGERY  2010  . TOE SURGERY Left 2006  . TONSILLECTOMY  1959  . TRIGGER FINGER RELEASE Right 2013-2014?     Prior to Admission medications   Medication Sig Start Date End Date Taking? Authorizing Provider  amLODipine (NORVASC) 5 MG tablet Take 5 mg by mouth daily.    Historical Provider, MD  aspirin 325 MG tablet Take 325 mg by mouth daily.    Historical Provider, MD  atorvastatin (LIPITOR) 10 MG tablet Take 10 mg by mouth daily.    Historical Provider, MD  Biotin 10 MG TABS Take by mouth.    Historical Provider, MD  calcium-vitamin D 250-100 MG-UNIT per tablet Take 1 tablet by mouth 2 (two) times daily.    Historical Provider, MD  escitalopram (LEXAPRO) 10 MG tablet Take 10 mg by mouth daily.    Historical Provider, MD  ferrous sulfate 325 (65 FE) MG tablet Take 325 mg by mouth daily with breakfast.    Historical Provider, MD  fluticasone (FLONASE) 50  MCG/ACT nasal spray  02/12/14   Historical Provider, MD  hydrochlorothiazide (HYDRODIURIL) 25 MG tablet Take 25 mg by mouth daily.    Historical Provider, MD  meloxicam (MOBIC) 15 MG tablet Take 15 mg by mouth daily.    Historical Provider, MD  Multiple Vitamins-Minerals (MULTIVITAMIN WITH MINERALS) tablet Take 1 tablet by mouth daily.    Historical Provider, MD  omeprazole (PRILOSEC) 20 MG capsule Take 20 mg by mouth daily.    Historical Provider, MD  PROAIR HFA 108 (90 BASE) MCG/ACT inhaler  01/28/14   Historical Provider, MD     Allergies Erythromycin and Levaquin [levofloxacin]   Family History  Problem Relation Age of Onset  . Cancer Father     prostate  . Cancer Brother     half brother/lung  . Breast cancer Other     Social History Social History  Substance Use Topics  . Smoking status: Never Smoker  . Smokeless tobacco: Never Used  . Alcohol use Yes    Review of Systems  Constitutional:   No fever or chills.  ENT:   No sore throat. No rhinorrhea. Cardiovascular:   No chest pain. Respiratory:   No dyspnea or cough. Gastrointestinal:   Negative for abdominal pain, vomiting and diarrhea.  Genitourinary:   Negative for dysuria or difficulty urinating. Musculoskeletal:   Negative for focal pain or swelling Neurological:  Positive headache 10-point ROS otherwise negative.  ____________________________________________   PHYSICAL EXAM:  VITAL SIGNS: ED Triage Vitals  Enc Vitals Group     BP 08/06/16 1649 (!) 152/86     Pulse Rate 08/06/16 1649 89     Resp 08/06/16 1649 16     Temp 08/06/16 1649 98.3 F (36.8 C)     Temp Source 08/06/16 1649 Oral     SpO2 08/06/16 1649 98 %     Weight 08/06/16 1651 220 lb (99.8 kg)     Height 08/06/16 1651 5\' 4"  (1.626 m)     Head Circumference --      Peak Flow --      Pain Score --      Pain Loc --      Pain Edu? --      Excl. in [place]? --     Vital signs reviewed, nursing assessments reviewed.   Constitutional:    Alert and oriented. Well appearing and in no distress. Eyes:   No scleral icterus. No conjunctival pallor. PERRL. EOMI.  No nystagmus. ENT   Head:   Normocephalic and atraumatic.   Nose:   No congestion/rhinnorhea. No septal hematoma   Mouth/Throat:   MMM, no pharyngeal erythema. No peritonsillar mass.    Neck:   No stridor. No SubQ emphysema. No meningismus. Hematological/Lymphatic/Immunilogical:   No cervical lymphadenopathy. Cardiovascular:   RRR. Symmetric bilateral radial and DP pulses.  No murmurs.  Respiratory:   Normal respiratory effort without tachypnea nor retractions. Breath sounds are clear and equal bilaterally. No wheezes/rales/rhonchi. Gastrointestinal:   Soft and nontender. Non distended. There is no CVA tenderness.  No rebound, rigidity, or guarding. Genitourinary:   deferred Musculoskeletal:   Nontender with normal range of motion in all extremities. No joint effusions.  No lower extremity tenderness.  No edema. Neurologic:   Normal speech and language.  CN exam shows slight droop at the right corner of the mouth which is new according to patient spouse.. Motor grossly intact. Sensation intact No pronator drift Normal heel to know, normal finger-nose-finger. NIH Stroke scale equals 2 No gross focal neurologic deficits are appreciated.  Skin:    Skin is warm, dry and intact. No rash noted.  No petechiae, purpura, or bullae.  ____________________________________________    LABS (pertinent positives/negatives) (all labs ordered are listed, but only abnormal results are displayed) Labs Reviewed  CBC - Abnormal; Notable for the following:       Result Value   WBC 11.3 (*)    All other components within normal limits  DIFFERENTIAL - Abnormal; Notable for the following:    Neutro Abs 7.3 (*)    Monocytes Absolute 1.1 (*)    All other components within normal limits  COMPREHENSIVE METABOLIC PANEL - Abnormal; Notable for the following:    Potassium 3.1 (*)     GFR calc non Af Amer 58 (*)    All other components within normal limits  TROPONIN I - Abnormal; Notable for the following:    Troponin I 0.04 (*)    All other components within normal limits  PROTIME-INR  APTT  CBG MONITORING, ED   ____________________________________________   EKG  Interpreted by me Normal sinus rhythm rate of 74, normal axis intervals QRS ST segments and T waves  ____________________________________________    RADIOLOGY  CT head unremarkable  ____________________________________________   PROCEDURES Procedures  ____________________________________________   INITIAL IMPRESSION / ASSESSMENT AND PLAN / ED COURSE  Pertinent labs & imaging results that  were available during my care of the patient were reviewed by me and considered in my medical decision making (see chart for details).  Patient presents with amnesia and right facial drooping, consistent with acute ischemic stroke. CT negative. Labs essentially unremarkable. We'll give aspirin, discussed with hospitalist for admission. Not a candidate for TPA.     Clinical Course   ____________________________________________   FINAL CLINICAL IMPRESSION(S) / ED DIAGNOSES  Final diagnoses:  Amnesia  Facial droop       Portions of this note were generated with dragon dictation software. Dictation errors may occur despite best attempts at proofreading.    Carrie Mew, MD 08/06/16 [phone]

## 2016-08-06 NOTE — ED Notes (Signed)
Patient transported to CT 

## 2016-08-06 NOTE — ED Notes (Signed)
Pt continues to have memory problems throughout triage. Pt could not remember president and could not remember having advanced directives.

## 2016-08-06 NOTE — ED Notes (Signed)
Admitting MD at bedside. Pt states symptoms started last night.

## 2016-08-06 NOTE — ED Triage Notes (Addendum)
Patient presents to the ED with sudden memory loss since yesterday morning.  This RN also notes right sided facial droop.  Patient is in no obvious distress at this time.  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  Patient ambulatory to triage and grip strength is equal.  Patient denies headache at this time.

## 2016-08-07 ENCOUNTER — Observation Stay: Payer: PPO

## 2016-08-07 ENCOUNTER — Ambulatory Visit: Payer: PRIVATE HEALTH INSURANCE | Admitting: Physical Therapy

## 2016-08-07 ENCOUNTER — Observation Stay
Admit: 2016-08-07 | Discharge: 2016-08-07 | Disposition: A | Discharge status: Home / Self Care | Payer: PPO | Attending: Internal Medicine | Admitting: Internal Medicine

## 2016-08-07 DIAGNOSIS — R55 Syncope and collapse: Secondary | ICD-10-CM | POA: Diagnosis not present

## 2016-08-07 DIAGNOSIS — G459 Transient cerebral ischemic attack, unspecified: Secondary | ICD-10-CM | POA: Diagnosis not present

## 2016-08-07 DIAGNOSIS — R29810 Facial weakness: Secondary | ICD-10-CM | POA: Diagnosis not present

## 2016-08-07 DIAGNOSIS — I6522 Occlusion and stenosis of left carotid artery: Secondary | ICD-10-CM | POA: Diagnosis not present

## 2016-08-07 DIAGNOSIS — I10 Essential (primary) hypertension: Secondary | ICD-10-CM | POA: Diagnosis not present

## 2016-08-07 LAB — LIPID PANEL
CHOL/HDL RATIO: 3.2 ratio
CHOLESTEROL: 144 mg/dL (ref 0–200)
HDL: 45 mg/dL (ref >40)
LDL Cholesterol: 70 mg/dL (ref 0–99)
TRIGLYCERIDES: 146 mg/dL (ref <150)
VLDL: 29 mg/dL (ref 0–40)

## 2016-08-07 LAB — ECHOCARDIOGRAM COMPLETE
HEIGHTINCHES: 64 in
Weight: 3480 oz

## 2016-08-07 LAB — HEMOGLOBIN A1C: HEMOGLOBIN A1C: 5.8 % (ref 4.0–6.0)

## 2016-08-07 NOTE — Care Management (Signed)
Late Note: Admitted to [facility] with the diagnosis of amnesia. Lives alone. Friend is Darrell [phone]). Last seen Dr. Caryl Comes about a year ago. Takes care of all basic and instrumental activities of daily living herself, drives.  Last fall was  December 2016.  Good appetite. Uses no aids for ambulation. Prescriptions are filled at [facility] in [place]. Discharge to home per Dr. Lavetta Nielsen. Shelbie Ammons RN MSN CCM Care Management [phone]

## 2016-08-07 NOTE — Care Management Obs Status (Signed)
[place] NOTIFICATION   Patient Details  Name: IMONI CHAMPOUX MRN: [id number] Date of Birth: 25-Feb-1946   Medicare Observation Status Notification Given:  Yes    Shelbie Ammons, RN 08/07/2016, 11:51 AM

## 2016-08-07 NOTE — Progress Notes (Signed)
Pt for discharge home. A/o. No resp distress.  All tests negative. Dr hower  dischargeing pt.discharge instructions discussed with pt meds / diet activity  And f/u discussed. Verbalized understanding

## 2016-08-07 NOTE — Progress Notes (Signed)
*  PRELIMINARY RESULTS* Echocardiogram 2D Echocardiogram has been performed.  Abigail Moody 08/07/2016, 8:47 AM

## 2016-08-07 NOTE — Discharge Instructions (Signed)
Dizziness Dizziness is a common problem. It is a feeling of unsteadiness or light-headedness. You may feel like you are about to faint. Dizziness can lead to injury if you stumble or fall. Anyone can become dizzy, but dizziness is more common in older adults. This condition can be caused by a number of things, including medicines, dehydration, or illness. HOME CARE INSTRUCTIONS Taking these steps may help with your condition: Eating and Drinking  Drink enough fluid to keep your urine clear or pale yellow. This helps to keep you from becoming dehydrated. Try to drink more clear fluids, such as water.  Do not drink alcohol.  Limit your caffeine intake if directed by your health care provider.  Limit your salt intake if directed by your health care provider. Activity  Avoid making quick movements.  Rise slowly from chairs and steady yourself until you feel okay.  In the morning, first sit up on the side of the bed. When you feel okay, stand slowly while you hold onto something until you know that your balance is fine.  Move your legs often if you need to stand in one place for a long time. Tighten and relax your muscles in your legs while you are standing.  Do not drive or operate heavy machinery if you feel dizzy.  Avoid bending down if you feel dizzy. Place items in your home so that they are easy for you to reach without leaning over. Lifestyle  Do not use any tobacco products, including cigarettes, chewing tobacco, or electronic cigarettes. If you need help quitting, ask your health care provider.  Try to reduce your stress level, such as with yoga or meditation. Talk with your health care provider if you need help. General Instructions  Watch your dizziness for any changes.  Take medicines only as directed by your health care provider. Talk with your health care provider if you think that your dizziness is caused by a medicine that you are taking.  Tell a friend or a family  member that you are feeling dizzy. If he or she notices any changes in your behavior, have this person call your health care provider.  Keep all follow-up visits as directed by your health care provider. This is important. SEEK MEDICAL CARE IF:  Your dizziness does not go away.  Your dizziness or light-headedness gets worse.  You feel nauseous.  You have reduced hearing.  You have new symptoms.  You are unsteady on your feet or you feel like the room is spinning. SEEK IMMEDIATE MEDICAL CARE IF:  You vomit or have diarrhea and are unable to eat or drink anything.  You have problems talking, walking, swallowing, or using your arms, hands, or legs.  You feel generally weak.  You are not thinking clearly or you have trouble forming sentences. It may take a friend or family member to notice this.  You have chest pain, abdominal pain, shortness of breath, or sweating.  Your vision changes.  You notice any bleeding.  You have a headache.  You have neck pain or a stiff neck.  You have a fever.   This information is not intended to replace advice given to you by your health care provider. Make sure you discuss any questions you have with your health care provider.   Document Released: 06/03/2001 Document Revised: 04/24/2015 Document Reviewed: 12/04/2014 Elsevier Interactive Patient Education 2016 [employer].   Cardiac Event Monitoring A cardiac event monitor is a small recording device used to help detect abnormal  heart rhythms (arrhythmias). The monitor is used to record heart rhythm when noticeable symptoms such as the following occur:  Fast heartbeats (palpitations), such as heart racing or fluttering.  Dizziness.  Fainting or light-headedness.  Unexplained weakness. The monitor is wired to two electrodes placed on your chest. Electrodes are flat, sticky disks that attach to your skin. The monitor can be worn for up to 30 days. You will wear the monitor at all  times, except when bathing.  HOW TO USE YOUR CARDIAC EVENT MONITOR A technician will prepare your chest for the electrode placement. The technician will show you how to place the electrodes, how to work the monitor, and how to replace the batteries. Take time to practice using the monitor before you leave the office. Make sure you understand how to send the information from the monitor to your health care provider. This requires a telephone with a landline, not a cell phone. You need to:  Wear your monitor at all times, except when you are in water:  Do not get the monitor wet.  Take the monitor off when bathing. Do not swim or use a hot tub with it on.  Keep your skin clean. Do not put body lotion or moisturizer on your chest.  Change the electrodes daily or any time they stop sticking to your skin. You might need to use tape to keep them on.  It is possible that your skin under the electrodes could become irritated. To keep this from happening, try to put the electrodes in slightly different places on your chest. However, they must remain in the area under your left breast and in the upper right section of your chest.  Make sure the monitor is safely clipped to your clothing or in a location close to your body that your health care provider recommends.  Press the button to record when you feel symptoms of heart trouble, such as dizziness, weakness, light-headedness, palpitations, thumping, shortness of breath, unexplained weakness, or a fluttering or racing heart. The monitor is always on and records what happened slightly before you pressed the button, so do not worry about being too late to get good information.  Keep a diary of your activities, such as walking, doing chores, and taking medicine. It is especially important to note what you were doing when you pushed the button to record your symptoms. This will help your health care provider determine what might be contributing to your  symptoms. The information stored in your monitor will be reviewed by your health care provider alongside your diary entries.  Send the recorded information as recommended by your health care provider. It is important to understand that it will take some time for your health care provider to process the results.  Change the batteries as recommended by your health care provider. SEEK IMMEDIATE MEDICAL CARE IF:   You have chest pain.  You have extreme difficulty breathing or shortness of breath.  You develop a very fast heartbeat that persists.  You develop dizziness that does not go away.  You faint or constantly feel you are about to faint.   This information is not intended to replace advice given to you by your health care provider. Make sure you discuss any questions you have with your health care provider.   Document Released: 09/16/2008 Document Revised: 12/29/2014 Document Reviewed: 06/06/2013 Elsevier Interactive Patient Education [employer].

## 2016-08-07 NOTE — Discharge Summary (Signed)
[place] [facility] [place] NAME: Abigail Moody    MR#:  [id number]  DATE OF BIRTH:  1946-05-31  DATE OF ADMISSION:  08/06/2016 ADMITTING PHYSICIAN: Epifanio Lesches, MD  DATE OF DISCHARGE: 08/07/16  PRIMARY CARE PHYSICIAN: Tama High III, MD    ADMISSION DIAGNOSIS:  Dizziness [R42] Amnesia [R41.3] Facial droop [R29.810]  DISCHARGE DIAGNOSIS:  tia Possible complex migraine   SECONDARY DIAGNOSIS:   Past Medical History:  Diagnosis Date  . Diffuse cystic mastopathy   . GERD (gastroesophageal reflux disease)   . Hemorrhoid   . Hypertension     HOSPITAL COURSE:  Abigail Moody  is a 70 y.o. female admitted 08/06/2016 with chief complaint Facial Droop. Please see H&P performed by Epifanio Lesches, MD for further information. Patient presented with the above symptoms right-sided facial droop as well as frontal headache. Given unilateral symptoms, concern for stroke. Stroke workup was unrevealing including MRI. Symptoms have resolved patient stable for discharge. This is also possible complex migraine as she does carry a history of migraine and symptoms were preceded by headache.  DISCHARGE CONDITIONS:   Stable  CONSULTS OBTAINED:  Treatment Team:  Alexis Goodell, MD  DRUG ALLERGIES:   Allergies  Allergen Reactions  . Erythromycin Itching  . Levaquin [Levofloxacin] Other (See Comments)    Lethargic, no energy, cannot function for daily activities per patient.     DISCHARGE MEDICATIONS:   Current Discharge Medication List    CONTINUE these medications which have NOT CHANGED   Details  amLODipine (NORVASC) 5 MG tablet Take 5 mg by mouth daily.    aspirin 325 MG tablet Take 325 mg by mouth daily.    atorvastatin (LIPITOR) 10 MG tablet Take 10 mg by mouth daily.    calcium-vitamin D 250-100 MG-UNIT per tablet Take 1 tablet by mouth 2 (two) times daily.    escitalopram (LEXAPRO) 10 MG tablet Take 10 mg by mouth  daily.    ferrous sulfate 325 (65 FE) MG tablet Take 325 mg by mouth daily with breakfast.    meloxicam (MOBIC) 15 MG tablet Take 15 mg by mouth daily.    Multiple Vitamins-Minerals (MULTIVITAMIN WITH MINERALS) tablet Take 1 tablet by mouth daily.    omeprazole (PRILOSEC) 20 MG capsule Take 20 mg by mouth daily.    Biotin 10 MG TABS Take by mouth.    fluticasone (FLONASE) 50 MCG/ACT nasal spray     hydrochlorothiazide (HYDRODIURIL) 25 MG tablet Take 25 mg by mouth daily.    PROAIR HFA 108 (90 BASE) MCG/ACT inhaler          DISCHARGE INSTRUCTIONS:    DIET:  Cardiac diet  DISCHARGE CONDITION:  Stable  ACTIVITY:  Activity as tolerated  OXYGEN:  Home Oxygen: No.   Oxygen Delivery: room air  DISCHARGE LOCATION:  home   If you experience worsening of your admission symptoms, develop shortness of breath, life threatening emergency, suicidal or homicidal thoughts you must seek medical attention immediately by calling 911 or calling your MD immediately  if symptoms less severe.  You Must read complete instructions/literature along with all the possible adverse reactions/side effects for all the Medicines you take and that have been prescribed to you. Take any new Medicines after you have completely understood and accpet all the possible adverse reactions/side effects.   Please note  You were cared for by a hospitalist during your hospital stay. If you have any questions about your discharge medications or the care  you received while you were in the hospital after you are discharged, you can call the unit and asked to speak with the hospitalist on call if the hospitalist that took care of you is not available. Once you are discharged, your primary care physician will handle any further medical issues. Please note that NO REFILLS for any discharge medications will be authorized once you are discharged, as it is imperative that you return to your primary care physician (or  establish a relationship with a primary care physician if you do not have one) for your aftercare needs so that they can reassess your need for medications and monitor your lab values.    On the day of Discharge:   VITAL SIGNS:  Blood pressure (!) 152/77, pulse 82, temperature 97.6 F (36.4 C), temperature source Oral, resp. rate 18, height 5\' 4"  (1.626 m), weight 98.7 kg (217 lb 8 oz), SpO2 99 %.  I/O:   Intake/Output Summary (Last 24 hours) at 08/07/16 1120 Last data filed at 08/07/16 0500  Gross per 24 hour  Intake              240 ml  Output                0 ml  Net              240 ml    PHYSICAL EXAMINATION:  GENERAL:  70 y.o.-year-old patient lying in the bed with no acute distress.  EYES: Pupils equal, round, reactive to light and accommodation. No scleral icterus. Extraocular muscles intact.  HEENT: Head atraumatic, normocephalic. Oropharynx and nasopharynx clear.  NECK:  Supple, no jugular venous distention. No thyroid enlargement, no tenderness.  LUNGS: Normal breath sounds bilaterally, no wheezing, rales,rhonchi or crepitation. No use of accessory muscles of respiration.  CARDIOVASCULAR: S1, S2 normal. No murmurs, rubs, or gallops.  ABDOMEN: Soft, non-tender, non-distended. Bowel sounds present. No organomegaly or mass.  EXTREMITIES: No pedal edema, cyanosis, or clubbing.  NEUROLOGIC: Cranial nerves II through XII are intact. Muscle strength 5/5 in all extremities. Sensation intact. Gait not checked.  PSYCHIATRIC: The patient is alert and oriented x 3.  SKIN: No obvious rash, lesion, or ulcer.   DATA REVIEW:   CBC  Recent Labs Lab 08/06/16 1653  WBC 11.3*  HGB 14.1  HCT 42.8  PLT 307    Chemistries   Recent Labs Lab 08/06/16 1653  NA 142  K 3.1*  CL 108  CO2 25  GLUCOSE 95  BUN 18  CREATININE 0.97  CALCIUM 9.1  AST 19  ALT 17  ALKPHOS 80  BILITOT 0.5    Cardiac Enzymes  Recent Labs Lab 08/06/16 1653  TROPONINI 0.04*    Microbiology  Results  No results found for this or any previous visit.  RADIOLOGY:  Ct Head Wo Contrast  Result Date: 08/06/2016 CLINICAL DATA:  70 year old female with sudden memory loss since yesterday morning. Right facial droop. Headache and nausea yesterday morning. Initial encounter. EXAM: CT HEAD WITHOUT CONTRAST TECHNIQUE: Contiguous axial images were obtained from the base of the skull through the vertex without intravenous contrast. COMPARISON:  Brain MRI and noncontrast head CT 04/02/2006. FINDINGS: Brain: Cerebral volume remains normal for age. No midline shift, ventriculomegaly, mass effect, evidence of mass lesion, intracranial hemorrhage or evidence of cortically based acute infarction. Gray-white matter differentiation is within normal limits throughout the brain. Vascular: Stable CT appearance of intracranial vasculature. No suspicious intracranial vascular hyperdensity. Skull:  No acute osseous abnormality identified.  Sinuses/Orbits: Visualized paranasal sinuses and mastoids are stable and well pneumatized. Other: Visualized orbits and scalp soft tissues are within normal limits. IMPRESSION: Normal for age non contrast CT appearance of the brain. Electronically Signed   By: Genevie Ann M.D.   On: 08/06/2016 16:43   Mr Brain Wo Contrast  Result Date: 08/07/2016 CLINICAL DATA:  Patient with essential hypertension was brought in for memory loss and RIGHT-sided facial droop occurring 08/06/2016. EXAM: MRI HEAD WITHOUT CONTRAST TECHNIQUE: Multiplanar, multiecho pulse sequences of the brain and surrounding structures were obtained without intravenous contrast. COMPARISON:  CT head 08/06/2016. FINDINGS: No evidence for acute infarction, hemorrhage, mass lesion, hydrocephalus, or extra-axial fluid. Normal for age cerebral volume. Mild T2 and FLAIR hyperintensities in the periventricular greater than subcortical white matter, likely chronic microvascular ischemic change. Flow voids are maintained throughout the  carotid, basilar, and vertebral arteries. There are no areas of chronic hemorrhage. Pituitary, pineal, and cerebellar tonsils unremarkable. No upper cervical lesions. Visualized calvarium, skull base, and upper cervical osseous structures unremarkable. Scalp and extracranial soft tissues, orbits, sinuses, and mastoids show no acute process. IMPRESSION: Mild chronic changes as described.  No acute intracranial findings. No intracranial abnormality is seen to suggest a cause for RIGHT facial droop. Electronically Signed   By: Staci Righter M.D.   On: 08/07/2016 09:58   US Carotid Bilateral (at [facility] Only)  Result Date: 08/07/2016 CLINICAL DATA:  Dizziness. EXAM: BILATERAL CAROTID DUPLEX ULTRASOUND TECHNIQUE: Pearline Cables scale imaging, color Doppler and duplex ultrasound were performed of bilateral carotid and vertebral arteries in the neck. COMPARISON:  CT 08/06/2016. FINDINGS: Criteria: Quantification of carotid stenosis is based on velocity parameters that correlate the residual internal carotid diameter with NASCET-based stenosis levels, using the diameter of the distal internal carotid lumen as the denominator for stenosis measurement. The following velocity measurements were obtained: RIGHT ICA:  61/15 cm/sec CCA:  [id number] cm/sec SYSTOLIC ICA/CCA RATIO:  0.8 DIASTOLIC ICA/CCA RATIO:  0.7 ECA:  52 cm/sec LEFT ICA:  72/29 cm/sec CCA:  [id number] cm/sec SYSTOLIC ICA/CCA RATIO:  0.8 DIASTOLIC ICA/CCA RATIO:  1.3 ECA:  59 cm/sec RIGHT CAROTID ARTERY: No significant carotid atherosclerotic vascular disease. RIGHT VERTEBRAL ARTERY:  Patent with antegrade flow. LEFT CAROTID ARTERY: Mild focal plaque left carotid bifurcation. No flow limiting stenosis. LEFT VERTEBRAL ARTERY:  Patent with antegrade flow. IMPRESSION: 1. Mild focal plaque left carotid bifurcation. No flow limiting stenosis. Degree of stenosis less than 50%. 2.  No significant right carotid atherosclerotic vascular disease. 3.  Vertebral arteries are patent  antegrade flow. Electronically Signed   By: Marcello Moores  Register   On: 08/07/2016 10:38     Management plans discussed with the patient, family and they are in agreement.  CODE STATUS:     Code Status Orders        Start     Ordered   08/06/16 1929  Full code  Continuous     08/06/16 1932    Code Status History    Date Active Date Inactive Code Status Order ID Comments User Context   08/06/2016  7:32 PM 08/07/2016  7:59 AM Full Code [id number]  Epifanio Lesches, MD ED    Advance Directive Documentation   Flowsheet Row Most Recent Value  Type of Advance Directive  Living will, Healthcare Power of Attorney  Pre-existing out of facility DNR order (yellow form or pink MOST form)  No data  "MOST" Form in Place?  No data      TOTAL TIME TAKING  CARE OF THIS PATIENT: 32 minutes.    Maniah Nading,  Karenann Cai.D on 08/07/2016 at 11:20 AM  Between 7am to 6pm - Pager - [phone]  After 6pm go to [email or web address] - Proofreader  [facility]  Office  [phone]  CC: Primary care physician; Adin Hector, MD

## 2016-08-07 NOTE — Progress Notes (Signed)
   [address], [place] [address]  August 07, 2016  Patient:  Abigail Moody Date of Birth: [birth year] Date of Visit:  08/06/2016  To Whom it May Concern:  Please excuse [name] from work from 08/06/2016 until 08/07/16 as she was admitted to the [facility] for medical treatment and has been receiving appropriate care. She may return to work on 08/09/16, sooner if she feels she is able to return sooner than this date.      Please don't hesitate to contact me with questions or concerns by calling  [phone] and asking them to page me directly.   Regino Schultze, MD

## 2016-08-07 NOTE — Progress Notes (Signed)
Speech Therapy Note: received order, reviewed chart notes. Consulted NSG then pt. Pt denied any difficulty swallowing and is currently on a regular diet; tolerates swallowing pills w/ water per NSG. Pt conversed at conversational level w/out deficits noted; pt denied any speech-language deficits.  No further skilled ST services indicated as pt appears at her baseline. Pt agreed. NSG to reconsult if any change in status while admitted. Pt is d/t discharge home today.

## 2016-08-12 ENCOUNTER — Encounter: Payer: PRIVATE HEALTH INSURANCE | Admitting: Physical Therapy

## 2016-08-14 ENCOUNTER — Encounter: Payer: PRIVATE HEALTH INSURANCE | Admitting: Physical Therapy

## 2016-08-15 DIAGNOSIS — H6191 Disorder of right external ear, unspecified: Secondary | ICD-10-CM | POA: Diagnosis not present

## 2016-08-15 DIAGNOSIS — E784 Other hyperlipidemia: Secondary | ICD-10-CM | POA: Diagnosis not present

## 2016-08-15 DIAGNOSIS — G4730 Sleep apnea, unspecified: Secondary | ICD-10-CM | POA: Diagnosis not present

## 2016-08-15 DIAGNOSIS — G4710 Hypersomnia, unspecified: Secondary | ICD-10-CM | POA: Diagnosis not present

## 2016-08-15 DIAGNOSIS — E559 Vitamin D deficiency, unspecified: Secondary | ICD-10-CM | POA: Diagnosis not present

## 2016-08-15 DIAGNOSIS — I10 Essential (primary) hypertension: Secondary | ICD-10-CM | POA: Diagnosis not present

## 2016-08-15 DIAGNOSIS — R413 Other amnesia: Secondary | ICD-10-CM | POA: Diagnosis not present

## 2016-08-19 ENCOUNTER — Encounter: Payer: PRIVATE HEALTH INSURANCE | Admitting: Physical Therapy

## 2016-08-19 ENCOUNTER — Encounter: Payer: Self-pay | Admitting: Physical Therapy

## 2016-08-19 ENCOUNTER — Ambulatory Visit: Payer: PPO | Attending: Internal Medicine | Admitting: Physical Therapy

## 2016-08-19 DIAGNOSIS — M75101 Unspecified rotator cuff tear or rupture of right shoulder, not specified as traumatic: Secondary | ICD-10-CM | POA: Insufficient documentation

## 2016-08-19 DIAGNOSIS — M25511 Pain in right shoulder: Secondary | ICD-10-CM | POA: Diagnosis not present

## 2016-08-19 DIAGNOSIS — M6281 Muscle weakness (generalized): Secondary | ICD-10-CM | POA: Diagnosis not present

## 2016-08-19 NOTE — Therapy (Signed)
[place], [place], [address] Phone: [phone]   Fax:  [phone]  Physical Therapy Treatment  Patient Details  Name: Abigail Moody MRN: [id number] Date of Birth: [birth year] Referring Provider: Netta Cedars  Encounter Date: 08/19/2016      PT End of Session - 08/19/16 1151    Visit Number 37   Number of Visits 42  adjusted for added apts from 7/13   Date for PT Re-Evaluation 09/05/16   Authorization Type WC- 10 more visits allowed effective 7/13   PT Start Time 1150   PT Stop Time 1228   PT Time Calculation (min) 38 min   Activity Tolerance Patient tolerated treatment well   Behavior During Therapy [facility] for tasks assessed/performed      Past Medical History:  Diagnosis Date  . Diffuse cystic mastopathy   . GERD (gastroesophageal reflux disease)   . Hemorrhoid   . Hypertension     Past Surgical History:  Procedure Laterality Date  . ANKLE SURGERY Right 2006  . BREAST BIOPSY Bilateral   . CARPAL TUNNEL RELEASE Bilateral   . COLONOSCOPY  2008   Dr. Epifanio Lesches  . MOUTH SURGERY  2010  . TOE SURGERY Left 2006  . TONSILLECTOMY  1959  . TRIGGER FINGER RELEASE Right 2013-2014?    There were no vitals filed for this visit.      Subjective Assessment - 08/19/16 1152    Subjective Can reach behind her back a little bit better, stil has pain on anterior aspect of shoulder. Can actively lift arm to straight out in front of her and can go higher using other arm. continues to have difficulty doing hair   Currently in Pain? Yes   Pain Score 3    Pain Location Shoulder   Pain Orientation Right   Pain Descriptors / Indicators Sore   Aggravating Factors  reaching overhead, lifting            [facility] PT Assessment - 08/19/16 0001      AROM   Right Shoulder Flexion 90 Degrees   Right Shoulder ABduction 74 Degrees     PROM   Right Shoulder Flexion 132 Degrees     Strength   Right  Shoulder Flexion 3-/5   Right Shoulder Extension 5/5   Right Shoulder ABduction 3-/5   Right Shoulder Internal Rotation 5/5   Right Shoulder External Rotation 4-/5                     [facility] Adult PT Treatment/Exercise - 08/19/16 0001      Shoulder Exercises: Seated   Flexion Limitations 1# lifts from table- GHJ resting 90 deg   ABduction Limitations 1# from resting elbow on table x30     Shoulder Exercises: Standing   Theraband Level (Shoulder Protraction) Other (comment)  flexed onto table protraction   Extension 20 reps;10 reps   Extension Limitations bent over supported on table     Shoulder Exercises: Pulleys   Flexion 3 minutes     Shoulder Exercises: Therapy Ball   Flexion Limitations 30 rolls up wall                PT Education - 08/19/16 1233    Education provided Yes   Education Details exercise form/rationale, HEP   Person(s) Educated Patient   Methods Explanation;Demonstration;Tactile cues;Verbal cues;Handout   Comprehension Verbalized understanding;Returned demonstration;Verbal cues required;Tactile cues required;Need further instruction  PT Short Term Goals - 07/01/16 1730      PT SHORT TERM GOAL #1   Title Pt will be I with initial HEP for continued strengthening and mobility   Status Achieved     PT SHORT TERM GOAL #2   Title R shoulder AROM will improve to 90 degrees pain-free for improved overhead reaching activities   Status Achieved     PT SHORT TERM GOAL #3   Title Pt will be able to demo and verbalize proper posture as it related to upper body, shoulder, and cervical spine    Status Achieved           PT Long Term Goals - 08/19/16 1154      PT LONG TERM GOAL #1   Title Patient will improve shoulder AROM to > 140 degrees of flexion, scaption, and abduction for improved ability to perform overhead activities by 8/11   Baseline flx 90, abd 74   Status On-going     PT LONG TERM GOAL #2   Title Patient will  demonstrate adequate shoulder ROM and strength to be able to shave and dress independently with pain less than 3/10   Baseline 3/10 with self care, has to compensate for lack of ROM- unable to wear anything with zipper   Status On-going     PT LONG TERM GOAL #3   Title Pt will be able to use RUE to carry purse/light grocery bag pain <2/10   Baseline 4/10   Status On-going     PT LONG TERM GOAL #4   Title Pt will be able to complete HHC such as mopping and cleaning bathtub pain <2/10   Baseline uses L arm, R arm assist and can scrub lower pieces that are closer   Status On-going     PT LONG TERM GOAL #5   Title Pt would like to be able to use upper extremity to hug without pain   Status On-going               Plan - 08/19/16 1238    Clinical Impression Statement Pt reports having minimal functional improvement but has been out of therapy due to hospital visit. Measurements and goals were updated but minimal improvement. pt demo Good PROM indicating available ROM through joint and would benefit from further strength training to increase AROM and functional use of upper extremity.    PT Treatment/Interventions Electrical Stimulation;Cryotherapy;Moist Heat;Ultrasound;Manual techniques;Therapeutic activities;Therapeutic exercise;ADLs/Self Care Home Management;Iontophoresis 4mg /ml Dexamethasone;Functional mobility training;Patient/family education;Neuromuscular re-education;Taping;Dry needling;Passive range of motion   PT Next Visit Plan challenging AROM above 90   PT Home Exercise Plan abduction lifts from table, extension   Consulted and Agree with Plan of Care Patient      Patient will benefit from skilled therapeutic intervention in order to improve the following deficits and impairments:  Impaired flexibility, Decreased strength, Decreased range of motion, Decreased activity tolerance, Improper body mechanics, Pain  Visit Diagnosis: Muscle weakness (generalized) - Plan: PT plan  of care cert/re-cert  Pain in right shoulder - Plan: PT plan of care cert/re-cert  RCT (rotator cuff tear), right - Plan: PT plan of care cert/re-cert     Problem List Patient Active Problem List   Diagnosis Date Noted  . Amnesia 08/06/2016  . Fibrocystic breast 02/24/2014    Claude Swendsen C. Metztli Sachdev PT, DPT 08/19/16 12:45 PM   [facility] [address] [place], [place], [address] Phone: [phone]   Fax:  [phone]  Name:  Abigail Moody MRN: [id number] Date of Birth: [birth year]

## 2016-08-21 ENCOUNTER — Ambulatory Visit: Payer: PPO | Admitting: Physical Therapy

## 2016-08-21 ENCOUNTER — Encounter: Payer: PRIVATE HEALTH INSURANCE | Admitting: Physical Therapy

## 2016-08-21 DIAGNOSIS — M6281 Muscle weakness (generalized): Secondary | ICD-10-CM

## 2016-08-21 DIAGNOSIS — M75101 Unspecified rotator cuff tear or rupture of right shoulder, not specified as traumatic: Secondary | ICD-10-CM

## 2016-08-21 DIAGNOSIS — M25511 Pain in right shoulder: Secondary | ICD-10-CM

## 2016-08-21 NOTE — Therapy (Signed)
[place], [place], [address] Phone: [phone]   Fax:  [phone]  Physical Therapy Treatment  Patient Details  Name: Abigail Moody MRN: [id number] Date of Birth: [birth year] Referring Provider: Netta Cedars  Encounter Date: 08/21/2016      PT End of Session - 08/21/16 1617    Visit Number 38   Number of Visits 42   Date for PT Re-Evaluation 09/05/16   PT Start Time [id number]  pt arrived 15 min late   PT Stop Time 1608   PT Time Calculation (min) 53 min   Activity Tolerance Patient tolerated treatment well   Behavior During Therapy [facility] for tasks assessed/performed      Past Medical History:  Diagnosis Date  . Diffuse cystic mastopathy   . GERD (gastroesophageal reflux disease)   . Hemorrhoid   . Hypertension     Past Surgical History:  Procedure Laterality Date  . ANKLE SURGERY Right 2006  . BREAST BIOPSY Bilateral   . CARPAL TUNNEL RELEASE Bilateral   . COLONOSCOPY  2008   Dr. Epifanio Lesches  . MOUTH SURGERY  2010  . TOE SURGERY Left 2006  . TONSILLECTOMY  1959  . TRIGGER FINGER RELEASE Right 2013-2014?    There were no vitals filed for this visit.      Subjective Assessment - 08/21/16 1517    Subjective "I am doing okay, some soreness from the other day, soreness with abducting the shoulder and with flexion"    Currently in Pain? Yes   Pain Score 3   movement, 0/10 with resting   Pain Orientation Right   Pain Type Surgical pain   Pain Onset More than a month ago   Pain Frequency Intermittent   Aggravating Factors  reaching overhead, abducting the arm   Pain Relieving Factors resting,             [facility] PT Assessment - 08/21/16 0001      AROM   Right Shoulder ABduction 102 Degrees  following treatment                     [facility] Adult PT Treatment/Exercise - 08/21/16 0001      Moist Heat Therapy   Number Minutes Moist Heat 10 Minutes   Moist Heat Location  Shoulder     Manual Therapy   Soft tissue mobilization IASTM along the R upper trap, teres major/ minor    Myofascial Release fascial rolling/ sustained stretch of  R upper trap/ levator scapulae   Scapular Mobilization assisted upward scapular rotation with abduction/ flexion 2 x 10          Trigger Point Dry Needling - 08/21/16 1521    Consent Given? Yes   Education Handout Provided No  given previously   Muscles Treated Upper Body Upper trapezius;Subscapularis  teres major/ minor with pistoning / twisting technique   Upper Trapezius Response Twitch reponse elicited;Palpable increased muscle length  x 1 with pistoning technique   Subscapularis Response Twitch response elicited;Palpable increased muscle length  x 1 with pistoning technique                PT Short Term Goals - 07/01/16 1730      PT SHORT TERM GOAL #1   Title Pt will be I with initial HEP for continued strengthening and mobility   Status Achieved     PT SHORT TERM GOAL #2   Title R shoulder AROM will improve to  90 degrees pain-free for improved overhead reaching activities   Status Achieved     PT SHORT TERM GOAL #3   Title Pt will be able to demo and verbalize proper posture as it related to upper body, shoulder, and cervical spine    Status Achieved           PT Long Term Goals - 08/19/16 1154      PT LONG TERM GOAL #1   Title Patient will improve shoulder AROM to > 140 degrees of flexion, scaption, and abduction for improved ability to perform overhead activities by 8/11   Baseline flx 90, abd 74   Status On-going     PT LONG TERM GOAL #2   Title Patient will demonstrate adequate shoulder ROM and strength to be able to shave and dress independently with pain less than 3/10   Baseline 3/10 with self care, has to compensate for lack of ROM- unable to wear anything with zipper   Status On-going     PT LONG TERM GOAL #3   Title Pt will be able to use RUE to carry purse/light grocery bag  pain <2/10   Baseline 4/10   Status On-going     PT LONG TERM GOAL #4   Title Pt will be able to complete HHC such as mopping and cleaning bathtub pain <2/10   Baseline uses L arm, R arm assist and can scrub lower pieces that are closer   Status On-going     PT LONG TERM GOAL #5   Title Pt would like to be able to use upper extremity to hug without pain   Status On-going               Plan - 08/21/16 1607    Clinical Impression Statement Mrs. Savarese reports that she continues to have pain and limited abduction/ flexion. DN was performed on the R upper trap/ teres major/ minor and sub-scapularis. following soft tissue work and scapular assist mobs, she reported relief of tightness and improved active abduction to 102 degrees post session with AROM.    PT Next Visit Plan assess response to DN, manual, scapular mobs, modalities PRN   Consulted and Agree with Plan of Care Patient      Patient will benefit from skilled therapeutic intervention in order to improve the following deficits and impairments:     Visit Diagnosis: Muscle weakness (generalized)  Pain in right shoulder  RCT (rotator cuff tear), right     Problem List Patient Active Problem List   Diagnosis Date Noted  . Amnesia 08/06/2016  . Fibrocystic breast 02/24/2014   Starr Lake PT, DPT, LAT, ATC  08/21/16  4:19 PM      [facility] [address] [place], [place], [address] Phone: [phone]   Fax:  [phone]  Name: Abigail Moody MRN: [id number] Date of Birth: 12/22/1946

## 2016-08-26 ENCOUNTER — Encounter: Payer: PRIVATE HEALTH INSURANCE | Admitting: Physical Therapy

## 2016-08-27 ENCOUNTER — Ambulatory Visit: Payer: PPO | Attending: Internal Medicine | Admitting: Physical Therapy

## 2016-08-27 DIAGNOSIS — M25511 Pain in right shoulder: Secondary | ICD-10-CM | POA: Diagnosis not present

## 2016-08-27 DIAGNOSIS — M75101 Unspecified rotator cuff tear or rupture of right shoulder, not specified as traumatic: Secondary | ICD-10-CM | POA: Diagnosis not present

## 2016-08-27 DIAGNOSIS — M6281 Muscle weakness (generalized): Secondary | ICD-10-CM | POA: Insufficient documentation

## 2016-08-27 NOTE — Therapy (Signed)
[place], [place], [address] Phone: [phone]   Fax:  [phone]  Physical Therapy Treatment / Discharge Note  Patient Details  Name: Abigail Moody MRN: [id number] Date of Birth: 04/27/1946 Referring Provider: Netta Cedars  Encounter Date: 08/27/2016      PT End of Session - 08/27/16 1246    Visit Number 39   Number of Visits 42   Date for PT Re-Evaluation 09/05/16   PT Start Time 1660   PT Stop Time 1247   PT Time Calculation (min) 58 min   Activity Tolerance Patient tolerated treatment well   Behavior During Therapy [facility] for tasks assessed/performed      Past Medical History:  Diagnosis Date  . Diffuse cystic mastopathy   . GERD (gastroesophageal reflux disease)   . Hemorrhoid   . Hypertension     Past Surgical History:  Procedure Laterality Date  . ANKLE SURGERY Right 2006  . BREAST BIOPSY Bilateral   . CARPAL TUNNEL RELEASE Bilateral   . COLONOSCOPY  2008   Dr. Epifanio Lesches  . MOUTH SURGERY  2010  . TOE SURGERY Left 2006  . TONSILLECTOMY  1959  . TRIGGER FINGER RELEASE Right 2013-2014?    There were no vitals filed for this visit.      Subjective Assessment - 08/27/16 1154    Subjective "I am feeling only sore with reaching the arm to the side but isn't bad when its to my side"    Currently in Pain? Yes   Pain Score 4    Pain Location Shoulder   Pain Orientation Right   Pain Descriptors / Indicators Sore   Pain Type Surgical pain   Pain Onset More than a month ago   Pain Frequency Intermittent   Aggravating Factors  reaching overhead, abducting the arm   Pain Relieving Factors resting            [facility] PT Assessment - 08/27/16 0001      AROM   Right Shoulder Flexion 96 Degrees   Right Shoulder ABduction 90 Degrees   Right Shoulder Internal Rotation --  L1 spinous process   Right Shoulder External Rotation 38 Degrees     Strength   Right Shoulder Flexion 3/5    Right Shoulder Extension 5/5   Right Shoulder ABduction 3/5   Right Shoulder Internal Rotation 4+/5   Right Shoulder External Rotation 4/5                     [facility] Adult PT Treatment/Exercise - 08/27/16 0001      Shoulder Exercises: Standing   Flexion PROM;Strengthening;10 reps  wall walks with eccentric control down   ABduction AROM;Strengthening;5 reps  walking up wall with eccentric control down     Cryotherapy   Number Minutes Cryotherapy 10 Minutes   Cryotherapy Location Shoulder   Type of Cryotherapy Ice pack  semi-reclined position     Manual Therapy   Manual Therapy Joint mobilization   Joint Mobilization distal clavicle mobs grade 2-3 inferior / posterior   given as HEP    Scapular Mobilization assisted upward scapular rotation with abduction/ flexion 2 x 10                PT Education - 08/27/16 1245    Education provided Yes   Education Details reviewed HEP and updated/ provided HEP packet.    Person(s) Educated Patient   Methods Explanation;Demonstration;Verbal cues;Handout   Comprehension Returned demonstration;Verbalized  understanding;Verbal cues required          PT Short Term Goals - 08/27/16 1218      PT SHORT TERM GOAL #1   Title Pt will be I with initial HEP for continued strengthening and mobility   Time 4   Period Weeks   Status Achieved     PT SHORT TERM GOAL #2   Title R shoulder AROM will improve to 90 degrees pain-free for improved overhead reaching activities   Time 4   Period Weeks   Status Achieved     PT SHORT TERM GOAL #3   Title Pt will be able to demo and verbalize proper posture as it related to upper body, shoulder, and cervical spine    Time 4   Period Weeks   Status Achieved           PT Long Term Goals - 08/27/16 1219      PT LONG TERM GOAL #1   Title Patient will improve shoulder AROM to > 140 degrees of flexion, scaption, and abduction for improved ability to perform overhead activities  by 8/11   Baseline flexion 96, abduction 90   Time 6   Period Weeks   Status Not Met     PT LONG TERM GOAL #2   Title Patient will demonstrate adequate shoulder ROM and strength to be able to shave and dress independently with pain less than 3/10   Time 6   Period Weeks   Status Achieved     PT LONG TERM GOAL #3   Title Pt will be able to use RUE to carry purse/light grocery bag pain <2/10   Baseline 4/10   Time 6   Period Weeks   Status Not Met     PT LONG TERM GOAL #4   Title Pt will be able to complete HHC such as mopping and cleaning bathtub pain <2/10   Baseline pain still with cleaning the tub   Time 6   Period Weeks   Status Partially Met     PT LONG TERM GOAL #5   Title Pt would like to be able to use upper extremity to hug without pain   Baseline difficulty getting over shoulder of hug recipient   Time 6   Period Weeks   Status Partially Met               Plan - 08/27/16 1249    Clinical Impression Statement Abigail Moody reports today is her last day. She has demonstrated improvement in shoulder mobility since evaluation but continues to demonstrate limitation in all planes secondary to weakness and soreness, but demos functional PROM. She as met all STGs, and did not meet LTG #1 and # 3  and partially met all other LTG. reviewed all HEP at length and provided packet, pt plans to conitinue with HEP and reports she is able to continue with HEP independently.    Rehab Potential Good   PT Next Visit Plan discharged from PT   PT Home Exercise Plan HEP review   Consulted and Agree with Plan of Care Patient      Patient will benefit from skilled therapeutic intervention in order to improve the following deficits and impairments:  Impaired flexibility, Decreased strength, Decreased range of motion, Decreased activity tolerance, Improper body mechanics, Pain  Visit Diagnosis: Muscle weakness (generalized)  Pain in right shoulder  RCT (rotator cuff tear),  right     Problem List Patient Active Problem List  Diagnosis Date Noted  . Amnesia 08/06/2016  . Fibrocystic breast 02/24/2014   Starr Lake PT, DPT, LAT, ATC  08/27/16  12:57 PM      [facility] [address] [place], [place], [address] Phone: [phone]   Fax:  [phone]  Name: Abigail Moody MRN: [id number] Date of Birth: 03-21-1946    PHYSICAL THERAPY DISCHARGE SUMMARY  Visits from Start of Care: 39  Current functional level related to goals / functional outcomes: See goals   Remaining deficits: Limited R shoulder AROM secondary to weakness and soreness in the lateral aspect of the shoulder. Functional PROM of the R shoulder.  Intermittent tightness in the R upper trap and surrounding musculature.     Education / Equipment: HEP, posture education, theraband for strengthening,   Plan: Patient agrees to discharge.  Patient goals were partially met. Patient is being discharged due to                                                     ?????

## 2016-08-28 ENCOUNTER — Encounter: Payer: PRIVATE HEALTH INSURANCE | Admitting: Physical Therapy

## 2016-09-02 ENCOUNTER — Encounter: Payer: PRIVATE HEALTH INSURANCE | Admitting: Physical Therapy

## 2016-09-02 ENCOUNTER — Encounter: Payer: Self-pay | Admitting: Physical Therapy

## 2016-09-04 ENCOUNTER — Encounter: Payer: PRIVATE HEALTH INSURANCE | Admitting: Physical Therapy

## 2016-09-08 ENCOUNTER — Ambulatory Visit: Payer: PPO | Admitting: Physical Therapy

## 2016-09-10 ENCOUNTER — Encounter: Payer: Self-pay | Admitting: Physical Therapy

## 2016-09-15 ENCOUNTER — Encounter: Payer: Self-pay | Admitting: Physical Therapy

## 2016-09-17 ENCOUNTER — Encounter: Payer: Self-pay | Admitting: Physical Therapy

## 2016-10-02 DIAGNOSIS — G454 Transient global amnesia: Secondary | ICD-10-CM | POA: Diagnosis not present

## 2016-10-02 DIAGNOSIS — G3184 Mild cognitive impairment, so stated: Secondary | ICD-10-CM | POA: Diagnosis not present

## 2016-11-17 DIAGNOSIS — E784 Other hyperlipidemia: Secondary | ICD-10-CM | POA: Diagnosis not present

## 2016-11-17 DIAGNOSIS — E559 Vitamin D deficiency, unspecified: Secondary | ICD-10-CM | POA: Diagnosis not present

## 2016-11-17 DIAGNOSIS — F3342 Major depressive disorder, recurrent, in full remission: Secondary | ICD-10-CM | POA: Diagnosis not present

## 2016-11-17 DIAGNOSIS — J4520 Mild intermittent asthma, uncomplicated: Secondary | ICD-10-CM | POA: Diagnosis not present

## 2016-11-17 DIAGNOSIS — I10 Essential (primary) hypertension: Secondary | ICD-10-CM | POA: Diagnosis not present

## 2016-11-17 DIAGNOSIS — K219 Gastro-esophageal reflux disease without esophagitis: Secondary | ICD-10-CM | POA: Diagnosis not present

## 2016-11-17 DIAGNOSIS — G4710 Hypersomnia, unspecified: Secondary | ICD-10-CM | POA: Diagnosis not present

## 2016-11-17 DIAGNOSIS — R3129 Other microscopic hematuria: Secondary | ICD-10-CM | POA: Diagnosis not present

## 2016-11-17 DIAGNOSIS — G4730 Sleep apnea, unspecified: Secondary | ICD-10-CM | POA: Diagnosis not present

## 2016-12-01 ENCOUNTER — Other Ambulatory Visit: Payer: Self-pay

## 2016-12-01 DIAGNOSIS — Z1231 Encounter for screening mammogram for malignant neoplasm of breast: Secondary | ICD-10-CM

## 2017-02-16 ENCOUNTER — Ambulatory Visit
Admission: RE | Admit: 2017-02-16 | Discharge: 2017-02-16 | Disposition: A | Discharge status: Home / Self Care | Payer: PPO | Source: Ambulatory Visit | Attending: General Surgery | Admitting: General Surgery

## 2017-02-16 DIAGNOSIS — Z1231 Encounter for screening mammogram for malignant neoplasm of breast: Secondary | ICD-10-CM

## 2017-02-18 ENCOUNTER — Encounter: Payer: Self-pay | Admitting: *Deleted

## 2017-02-19 ENCOUNTER — Ambulatory Visit (INDEPENDENT_AMBULATORY_CARE_PROVIDER_SITE_OTHER): Payer: PPO | Admitting: General Surgery

## 2017-02-19 VITALS — BP 144/90 | HR 83 | Resp 16 | Ht 64.0 in | Wt 205.0 lb

## 2017-02-19 DIAGNOSIS — Z86018 Personal history of other benign neoplasm: Secondary | ICD-10-CM

## 2017-02-19 NOTE — Patient Instructions (Signed)
Patient to follow up with her PCP for mammogram and breast checks.
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
## 2017-03-03 ENCOUNTER — Telehealth: Payer: Self-pay | Admitting: *Deleted

## 2017-03-03 NOTE — Telephone Encounter (Signed)
error 

## 2017-03-11 DIAGNOSIS — I10 Essential (primary) hypertension: Secondary | ICD-10-CM | POA: Diagnosis not present

## 2017-03-11 DIAGNOSIS — E559 Vitamin D deficiency, unspecified: Secondary | ICD-10-CM | POA: Diagnosis not present

## 2017-03-11 DIAGNOSIS — R3129 Other microscopic hematuria: Secondary | ICD-10-CM | POA: Diagnosis not present

## 2017-03-17 DIAGNOSIS — Z0000 Encounter for general adult medical examination without abnormal findings: Secondary | ICD-10-CM | POA: Diagnosis not present

## 2017-03-17 DIAGNOSIS — E784 Other hyperlipidemia: Secondary | ICD-10-CM | POA: Diagnosis not present

## 2017-03-17 DIAGNOSIS — F3342 Major depressive disorder, recurrent, in full remission: Secondary | ICD-10-CM | POA: Diagnosis not present

## 2017-03-17 DIAGNOSIS — K219 Gastro-esophageal reflux disease without esophagitis: Secondary | ICD-10-CM | POA: Diagnosis not present

## 2017-03-17 DIAGNOSIS — G4710 Hypersomnia, unspecified: Secondary | ICD-10-CM | POA: Diagnosis not present

## 2017-03-17 DIAGNOSIS — M1990 Unspecified osteoarthritis, unspecified site: Secondary | ICD-10-CM | POA: Diagnosis not present

## 2017-03-17 DIAGNOSIS — I10 Essential (primary) hypertension: Secondary | ICD-10-CM | POA: Diagnosis not present

## 2017-03-17 DIAGNOSIS — E559 Vitamin D deficiency, unspecified: Secondary | ICD-10-CM | POA: Diagnosis not present

## 2017-03-17 DIAGNOSIS — R3129 Other microscopic hematuria: Secondary | ICD-10-CM | POA: Diagnosis not present

## 2017-03-17 DIAGNOSIS — G4730 Sleep apnea, unspecified: Secondary | ICD-10-CM | POA: Diagnosis not present

## 2017-03-17 DIAGNOSIS — J4520 Mild intermittent asthma, uncomplicated: Secondary | ICD-10-CM | POA: Diagnosis not present

## 2017-03-23 DIAGNOSIS — G3184 Mild cognitive impairment, so stated: Secondary | ICD-10-CM | POA: Diagnosis not present

## 2017-03-23 DIAGNOSIS — R4020 Unspecified coma: Secondary | ICD-10-CM | POA: Diagnosis not present

## 2017-03-23 DIAGNOSIS — I6529 Occlusion and stenosis of unspecified carotid artery: Secondary | ICD-10-CM | POA: Diagnosis not present

## 2017-03-27 DIAGNOSIS — R4020 Unspecified coma: Secondary | ICD-10-CM | POA: Diagnosis not present

## 2017-03-27 DIAGNOSIS — R55 Syncope and collapse: Secondary | ICD-10-CM | POA: Diagnosis not present

## 2017-03-27 DIAGNOSIS — R42 Dizziness and giddiness: Secondary | ICD-10-CM | POA: Diagnosis not present

## 2017-03-27 DIAGNOSIS — I10 Essential (primary) hypertension: Secondary | ICD-10-CM | POA: Diagnosis not present

## 2017-03-27 DIAGNOSIS — R0602 Shortness of breath: Secondary | ICD-10-CM | POA: Diagnosis not present

## 2017-03-27 DIAGNOSIS — E784 Other hyperlipidemia: Secondary | ICD-10-CM | POA: Diagnosis not present

## 2017-04-01 DIAGNOSIS — R4020 Unspecified coma: Secondary | ICD-10-CM | POA: Diagnosis not present

## 2017-04-16 DIAGNOSIS — I6523 Occlusion and stenosis of bilateral carotid arteries: Secondary | ICD-10-CM | POA: Diagnosis not present

## 2017-04-16 DIAGNOSIS — I6529 Occlusion and stenosis of unspecified carotid artery: Secondary | ICD-10-CM | POA: Diagnosis not present

## 2017-04-20 DIAGNOSIS — R0602 Shortness of breath: Secondary | ICD-10-CM | POA: Diagnosis not present

## 2017-04-22 DIAGNOSIS — M25511 Pain in right shoulder: Secondary | ICD-10-CM | POA: Diagnosis not present

## 2017-04-27 DIAGNOSIS — I6529 Occlusion and stenosis of unspecified carotid artery: Secondary | ICD-10-CM | POA: Diagnosis not present

## 2017-04-27 DIAGNOSIS — I10 Essential (primary) hypertension: Secondary | ICD-10-CM | POA: Diagnosis not present

## 2017-04-27 DIAGNOSIS — E784 Other hyperlipidemia: Secondary | ICD-10-CM | POA: Diagnosis not present

## 2017-04-27 DIAGNOSIS — R4020 Unspecified coma: Secondary | ICD-10-CM | POA: Diagnosis not present

## 2017-05-06 DIAGNOSIS — R569 Unspecified convulsions: Secondary | ICD-10-CM | POA: Diagnosis not present

## 2017-05-06 DIAGNOSIS — R413 Other amnesia: Secondary | ICD-10-CM | POA: Diagnosis not present

## 2017-05-21 DIAGNOSIS — R569 Unspecified convulsions: Secondary | ICD-10-CM | POA: Diagnosis not present

## 2017-05-21 DIAGNOSIS — I779 Disorder of arteries and arterioles, unspecified: Secondary | ICD-10-CM | POA: Diagnosis not present

## 2017-05-21 DIAGNOSIS — G4710 Hypersomnia, unspecified: Secondary | ICD-10-CM | POA: Diagnosis not present

## 2017-05-21 DIAGNOSIS — E559 Vitamin D deficiency, unspecified: Secondary | ICD-10-CM | POA: Diagnosis not present

## 2017-05-21 DIAGNOSIS — E784 Other hyperlipidemia: Secondary | ICD-10-CM | POA: Diagnosis not present

## 2017-05-21 DIAGNOSIS — J4520 Mild intermittent asthma, uncomplicated: Secondary | ICD-10-CM | POA: Diagnosis not present

## 2017-05-21 DIAGNOSIS — G4730 Sleep apnea, unspecified: Secondary | ICD-10-CM | POA: Diagnosis not present

## 2017-05-21 DIAGNOSIS — K219 Gastro-esophageal reflux disease without esophagitis: Secondary | ICD-10-CM | POA: Diagnosis not present

## 2017-05-21 DIAGNOSIS — I10 Essential (primary) hypertension: Secondary | ICD-10-CM | POA: Diagnosis not present

## 2017-06-09 DIAGNOSIS — H25813 Combined forms of age-related cataract, bilateral: Secondary | ICD-10-CM | POA: Diagnosis not present

## 2017-06-19 DIAGNOSIS — R569 Unspecified convulsions: Secondary | ICD-10-CM | POA: Diagnosis not present

## 2017-06-20 DIAGNOSIS — R569 Unspecified convulsions: Secondary | ICD-10-CM | POA: Diagnosis not present

## 2017-06-21 DIAGNOSIS — R569 Unspecified convulsions: Secondary | ICD-10-CM | POA: Diagnosis not present

## 2017-07-06 DIAGNOSIS — R569 Unspecified convulsions: Secondary | ICD-10-CM | POA: Diagnosis not present

## 2017-07-08 DIAGNOSIS — R569 Unspecified convulsions: Secondary | ICD-10-CM | POA: Diagnosis not present

## 2017-07-08 DIAGNOSIS — R413 Other amnesia: Secondary | ICD-10-CM | POA: Diagnosis not present

## 2017-07-08 DIAGNOSIS — G479 Sleep disorder, unspecified: Secondary | ICD-10-CM | POA: Diagnosis not present

## 2017-07-08 DIAGNOSIS — R4020 Unspecified coma: Secondary | ICD-10-CM | POA: Diagnosis not present

## 2017-08-18 DIAGNOSIS — I10 Essential (primary) hypertension: Secondary | ICD-10-CM | POA: Diagnosis not present

## 2017-08-18 DIAGNOSIS — E784 Other hyperlipidemia: Secondary | ICD-10-CM | POA: Diagnosis not present

## 2017-08-18 DIAGNOSIS — I779 Disorder of arteries and arterioles, unspecified: Secondary | ICD-10-CM | POA: Diagnosis not present

## 2017-08-28 DIAGNOSIS — R569 Unspecified convulsions: Secondary | ICD-10-CM | POA: Diagnosis not present

## 2017-08-28 DIAGNOSIS — F3342 Major depressive disorder, recurrent, in full remission: Secondary | ICD-10-CM | POA: Diagnosis not present

## 2017-08-28 DIAGNOSIS — R3129 Other microscopic hematuria: Secondary | ICD-10-CM | POA: Diagnosis not present

## 2017-08-28 DIAGNOSIS — M1990 Unspecified osteoarthritis, unspecified site: Secondary | ICD-10-CM | POA: Diagnosis not present

## 2017-08-28 DIAGNOSIS — Z1211 Encounter for screening for malignant neoplasm of colon: Secondary | ICD-10-CM | POA: Diagnosis not present

## 2017-08-28 DIAGNOSIS — K219 Gastro-esophageal reflux disease without esophagitis: Secondary | ICD-10-CM | POA: Diagnosis not present

## 2017-08-28 DIAGNOSIS — E559 Vitamin D deficiency, unspecified: Secondary | ICD-10-CM | POA: Diagnosis not present

## 2017-08-28 DIAGNOSIS — I10 Essential (primary) hypertension: Secondary | ICD-10-CM | POA: Diagnosis not present

## 2017-08-28 DIAGNOSIS — E784 Other hyperlipidemia: Secondary | ICD-10-CM | POA: Diagnosis not present

## 2017-08-28 DIAGNOSIS — G4710 Hypersomnia, unspecified: Secondary | ICD-10-CM | POA: Diagnosis not present

## 2017-08-28 DIAGNOSIS — J4520 Mild intermittent asthma, uncomplicated: Secondary | ICD-10-CM | POA: Diagnosis not present

## 2017-08-28 DIAGNOSIS — I779 Disorder of arteries and arterioles, unspecified: Secondary | ICD-10-CM | POA: Diagnosis not present

## 2017-09-01 IMAGING — MR MR SHOULDER*R* W/O CM
4 of 6 series · 19 of 40 positions shown · non-contrast
Comparison: None.

CLINICAL DATA: Right shoulder pain.  Slipped in the parking lot.

EXAM:
MRI OF THE RIGHT SHOULDER WITHOUT CONTRAST
TECHNIQUE: Multiplanar, multisequence MR imaging of the shoulder was performed.
No intravenous contrast was administered.

[Series 4: T2 fat-sat · axial · 3.0mm · 0.23mm/px · z∈[-64,+17]mm · 6 of 24 slices shown (1 of 3)]
[im 1/24]
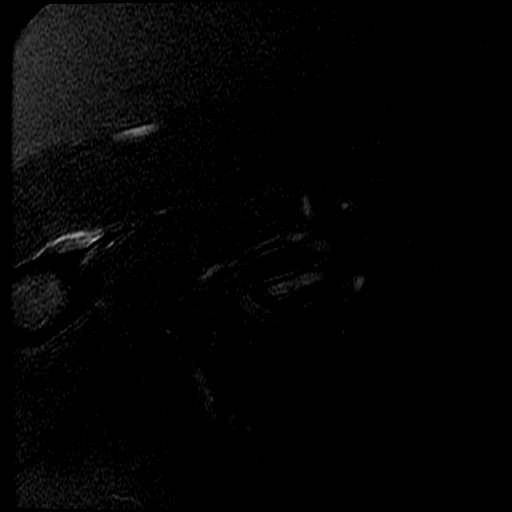
[im 5/24]
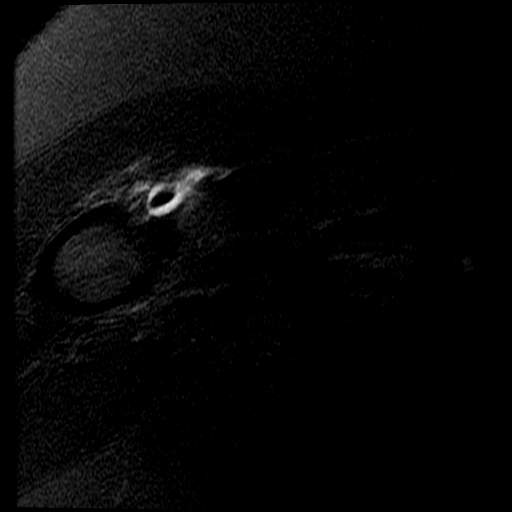
[im 10/24]
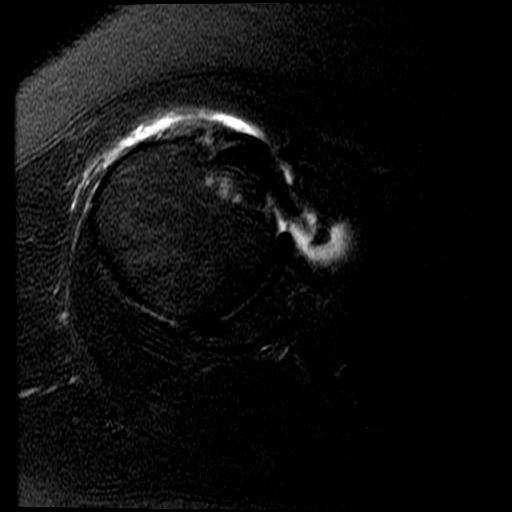
[im 14/24]
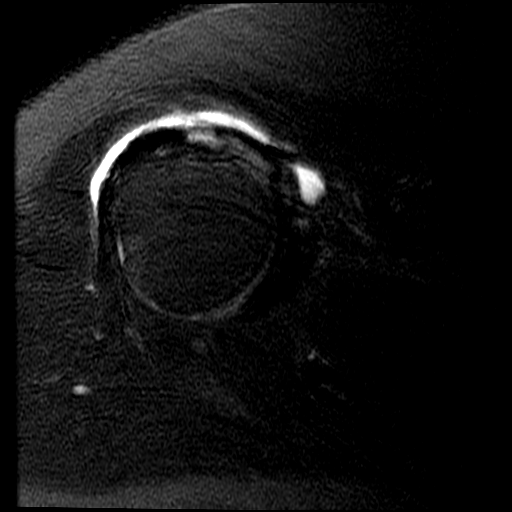
[im 19/24]
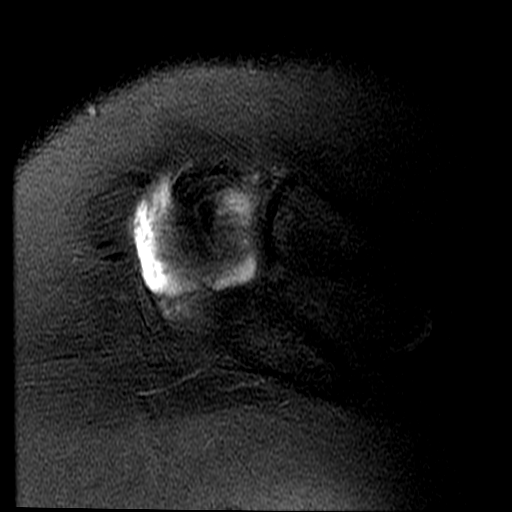
[im 24/24]
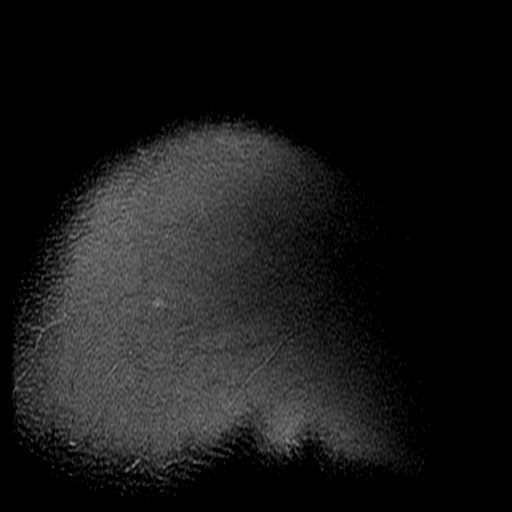

[Series 5: T2 fat-sat · oblique · 3.0mm · 0.27mm/px · 3 of 26 slices shown (2 of 3)]
[im 5/26]
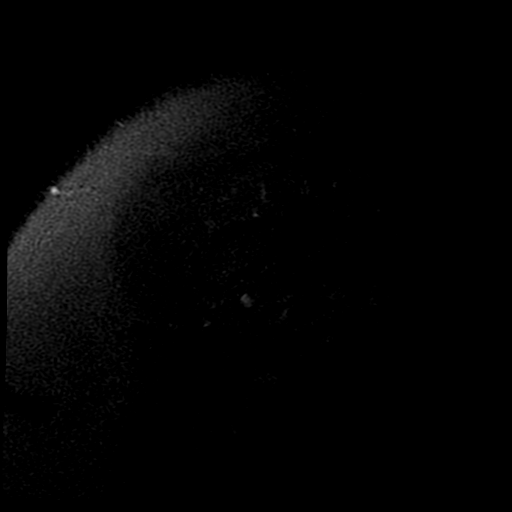
[im 13/26]
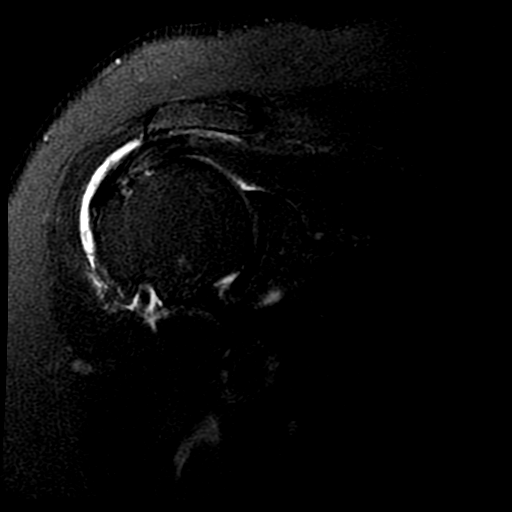
[im 21/26]
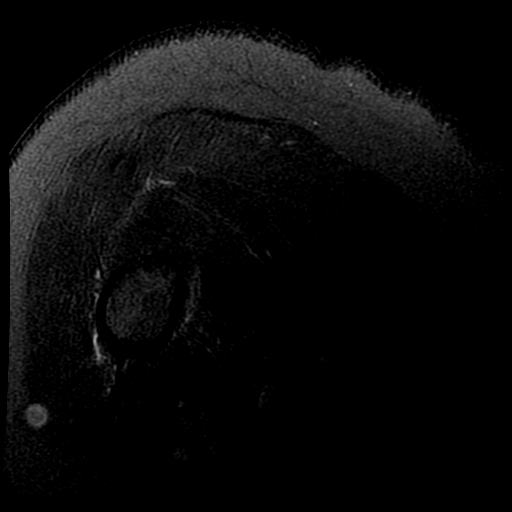

[Series 6: PD · oblique · 3.0mm · 0.27mm/px · 7 of 26 slices shown]
[im 1/26]
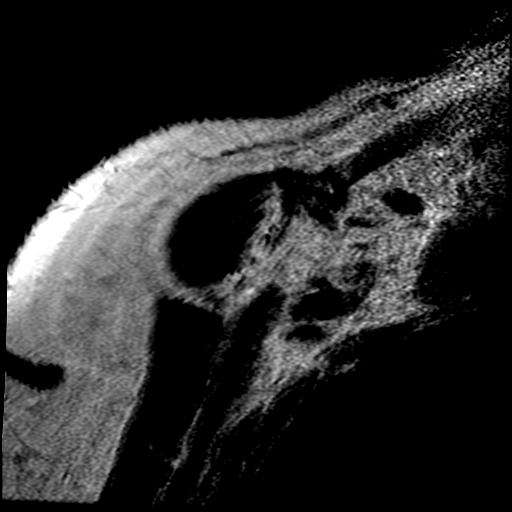
[im 5/26]
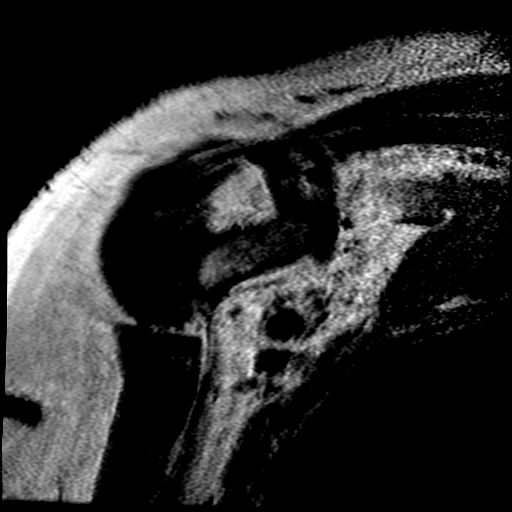
[im 9/26]
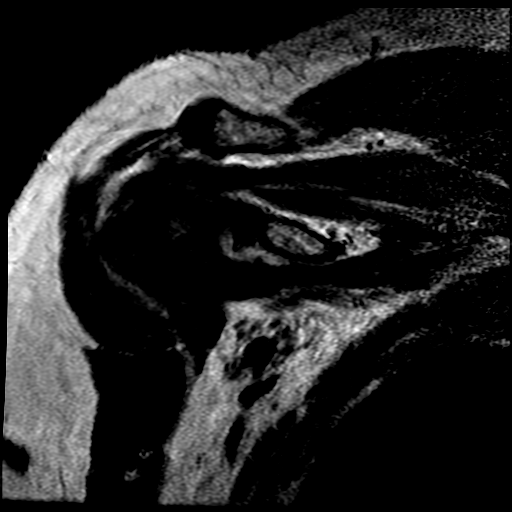
[im 13/26]
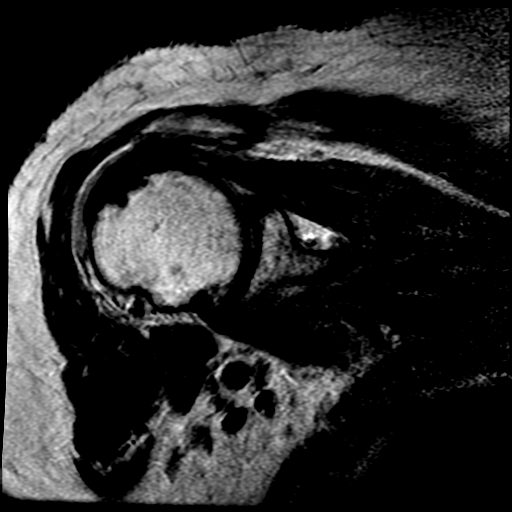
[im 17/26]
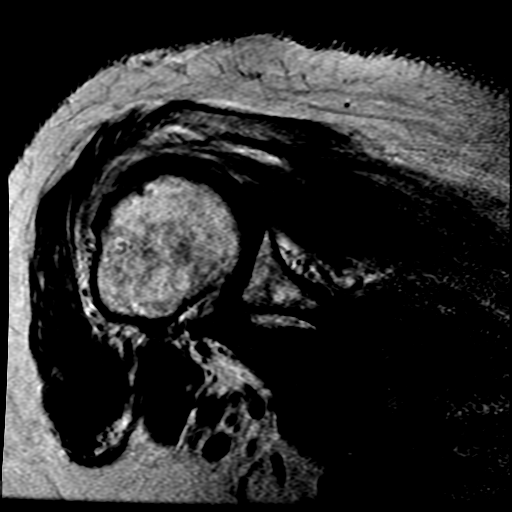
[im 21/26]
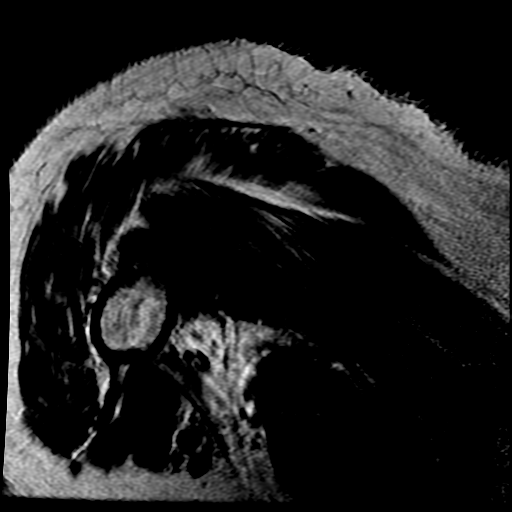
[im 26/26]
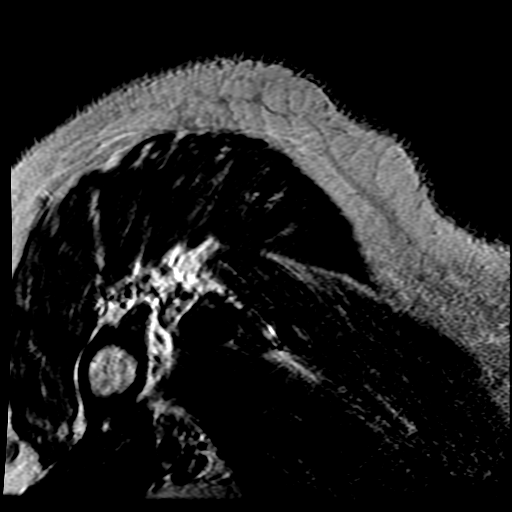

[Series 8: T2 fat-sat · coronal · 3.0mm · 0.27mm/px · 3 of 28 slices shown (3 of 3)]
[im 5/28]
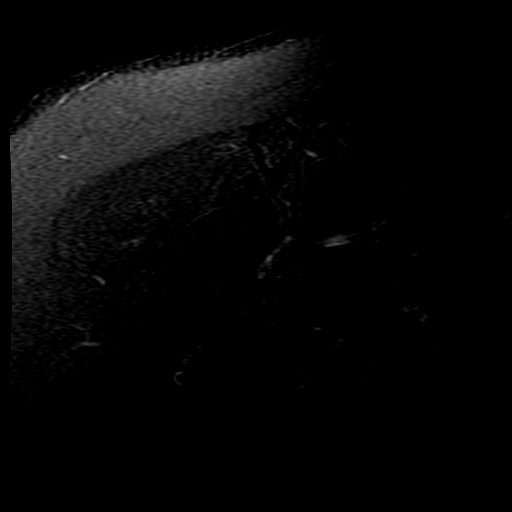
[im 14/28]
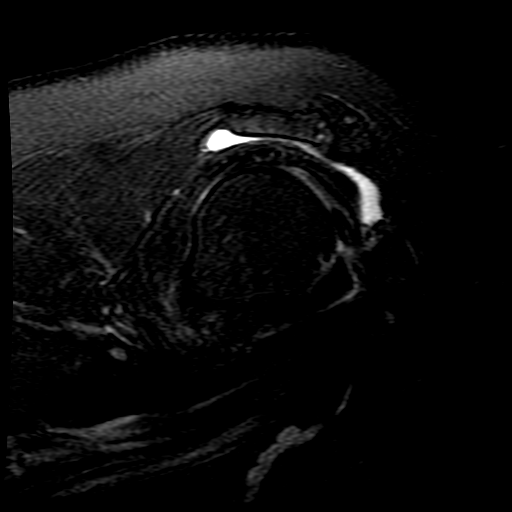
[im 23/28]
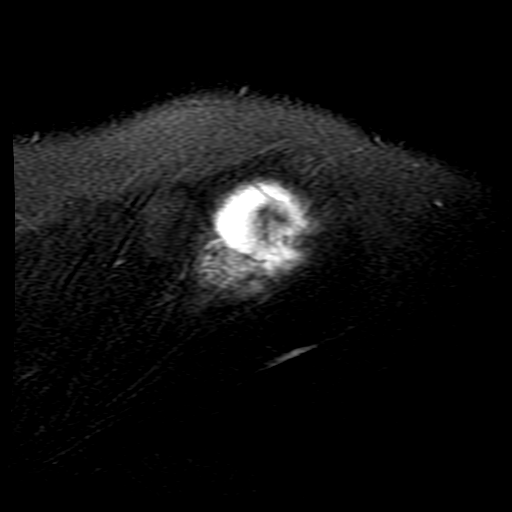

[19 of 40 positions shown; findings below may reference images not displayed]

FINDINGS: Rotator cuff: Severe tendinosis of the supraspinatus tendon with a
small high-grade partial-thickness bursal surface tear of the
anterior fibers. Severe tendinosis of the infraspinatus tendon.
Teres minor tendon is intact. Moderate tendinosis of the
subscapularis tendon with a small partial thickness tear of the
superior fibers.

Muscles: No atrophy or fatty replacement of nor abnormal signal
within, the muscles of the rotator cuff.

Biceps long head:  Intact.

Acromioclavicular Joint: Mild degenerative changes of the
acromioclavicular joint. Moderate amount of subacromial/ subdeltoid
bursal fluid. Type I acromion.

Glenohumeral Joint: No joint effusion.  No chondral defect.

Labrum: Grossly intact, but evaluation is limited by lack of
intraarticular fluid.

Bones:  No marrow signal abnormality.  No fracture dislocation.
IMPRESSION: 1. Severe tendinosis of the supraspinatus tendon with a small
high-grade partial-thickness bursal surface tear of the anterior
fibers.
2. Severe tendinosis of the infraspinatus tendon.
3. Moderate tendinosis of the subscapularis tendon with a small
partial thickness tear of the superior fibers.
4. Moderate subacromial/subdeltoid bursitis.

## 2017-11-16 ENCOUNTER — Encounter: Payer: Self-pay | Admitting: Emergency Medicine

## 2017-11-16 DIAGNOSIS — R569 Unspecified convulsions: Secondary | ICD-10-CM | POA: Diagnosis not present

## 2017-11-16 DIAGNOSIS — Z5321 Procedure and treatment not carried out due to patient leaving prior to being seen by health care provider: Secondary | ICD-10-CM | POA: Diagnosis not present

## 2017-11-16 NOTE — ED Triage Notes (Signed)
Pt reports history of seizures, friend reports were playing cards and  Friend reports pt was not responding, rolled eyes back lasted for 15 to 30 seconds, pt reports she has "abscense seizures' pt talks in complete sentences, no distress noted

## 2017-11-17 ENCOUNTER — Emergency Department
Admission: EM | Admit: 2017-11-17 | Discharge: 2017-11-17 | Disposition: A | Discharge status: Home / Self Care | Payer: PPO | Attending: Emergency Medicine | Admitting: Emergency Medicine

## 2017-11-17 HISTORY — DX: Unspecified convulsions: R56.9

## 2017-11-18 ENCOUNTER — Telehealth: Payer: Self-pay | Admitting: Emergency Medicine

## 2017-11-18 DIAGNOSIS — R569 Unspecified convulsions: Secondary | ICD-10-CM | POA: Diagnosis not present

## 2017-11-18 NOTE — Telephone Encounter (Signed)
Called patient due to lwot to inquire about condition and follow up plans. Left message.   

## 2017-12-07 DIAGNOSIS — G479 Sleep disorder, unspecified: Secondary | ICD-10-CM | POA: Diagnosis not present

## 2017-12-07 DIAGNOSIS — G3184 Mild cognitive impairment, so stated: Secondary | ICD-10-CM | POA: Diagnosis not present

## 2017-12-07 DIAGNOSIS — R569 Unspecified convulsions: Secondary | ICD-10-CM | POA: Diagnosis not present

## 2017-12-07 DIAGNOSIS — M1990 Unspecified osteoarthritis, unspecified site: Secondary | ICD-10-CM | POA: Diagnosis not present

## 2018-01-19 DIAGNOSIS — Z1211 Encounter for screening for malignant neoplasm of colon: Secondary | ICD-10-CM | POA: Diagnosis not present

## 2018-02-01 ENCOUNTER — Other Ambulatory Visit: Payer: Self-pay | Admitting: Internal Medicine

## 2018-02-01 DIAGNOSIS — Z1231 Encounter for screening mammogram for malignant neoplasm of breast: Secondary | ICD-10-CM

## 2018-02-17 ENCOUNTER — Ambulatory Visit
Admission: RE | Admit: 2018-02-17 | Discharge: 2018-02-17 | Disposition: A | Discharge status: Home / Self Care | Payer: PPO | Source: Ambulatory Visit | Attending: Internal Medicine | Admitting: Internal Medicine

## 2018-02-17 DIAGNOSIS — Z1231 Encounter for screening mammogram for malignant neoplasm of breast: Secondary | ICD-10-CM | POA: Diagnosis not present

## 2018-03-12 DIAGNOSIS — E7849 Other hyperlipidemia: Secondary | ICD-10-CM | POA: Diagnosis not present

## 2018-03-12 DIAGNOSIS — I779 Disorder of arteries and arterioles, unspecified: Secondary | ICD-10-CM | POA: Diagnosis not present

## 2018-03-12 DIAGNOSIS — E559 Vitamin D deficiency, unspecified: Secondary | ICD-10-CM | POA: Diagnosis not present

## 2018-03-12 DIAGNOSIS — R3129 Other microscopic hematuria: Secondary | ICD-10-CM | POA: Diagnosis not present

## 2018-03-12 DIAGNOSIS — I10 Essential (primary) hypertension: Secondary | ICD-10-CM | POA: Diagnosis not present

## 2018-03-30 ENCOUNTER — Encounter: Payer: Self-pay | Admitting: *Deleted

## 2018-03-31 ENCOUNTER — Encounter
Admission: RE | Disposition: A | Discharge status: Home / Self Care | Payer: Self-pay | Source: Ambulatory Visit | Attending: Unknown Physician Specialty

## 2018-03-31 ENCOUNTER — Ambulatory Visit: Payer: PPO | Admitting: Anesthesiology

## 2018-03-31 ENCOUNTER — Ambulatory Visit
Admission: RE | Admit: 2018-03-31 | Discharge: 2018-03-31 | Disposition: A | Discharge status: Home / Self Care | Payer: PPO | Source: Ambulatory Visit | Attending: Unknown Physician Specialty | Admitting: Unknown Physician Specialty

## 2018-03-31 ENCOUNTER — Encounter: Payer: Self-pay | Admitting: *Deleted

## 2018-03-31 DIAGNOSIS — K6389 Other specified diseases of intestine: Secondary | ICD-10-CM | POA: Insufficient documentation

## 2018-03-31 DIAGNOSIS — I10 Essential (primary) hypertension: Secondary | ICD-10-CM | POA: Diagnosis not present

## 2018-03-31 DIAGNOSIS — K640 First degree hemorrhoids: Secondary | ICD-10-CM | POA: Insufficient documentation

## 2018-03-31 DIAGNOSIS — G3184 Mild cognitive impairment, so stated: Secondary | ICD-10-CM | POA: Insufficient documentation

## 2018-03-31 DIAGNOSIS — Z7982 Long term (current) use of aspirin: Secondary | ICD-10-CM | POA: Insufficient documentation

## 2018-03-31 DIAGNOSIS — K219 Gastro-esophageal reflux disease without esophagitis: Secondary | ICD-10-CM | POA: Insufficient documentation

## 2018-03-31 DIAGNOSIS — Z79899 Other long term (current) drug therapy: Secondary | ICD-10-CM | POA: Insufficient documentation

## 2018-03-31 DIAGNOSIS — Z7951 Long term (current) use of inhaled steroids: Secondary | ICD-10-CM | POA: Insufficient documentation

## 2018-03-31 DIAGNOSIS — K635 Polyp of colon: Secondary | ICD-10-CM | POA: Insufficient documentation

## 2018-03-31 DIAGNOSIS — J45909 Unspecified asthma, uncomplicated: Secondary | ICD-10-CM | POA: Diagnosis not present

## 2018-03-31 DIAGNOSIS — E785 Hyperlipidemia, unspecified: Secondary | ICD-10-CM | POA: Insufficient documentation

## 2018-03-31 DIAGNOSIS — Z1211 Encounter for screening for malignant neoplasm of colon: Secondary | ICD-10-CM | POA: Diagnosis not present

## 2018-03-31 DIAGNOSIS — F329 Major depressive disorder, single episode, unspecified: Secondary | ICD-10-CM | POA: Insufficient documentation

## 2018-03-31 DIAGNOSIS — R569 Unspecified convulsions: Secondary | ICD-10-CM | POA: Diagnosis not present

## 2018-03-31 DIAGNOSIS — E559 Vitamin D deficiency, unspecified: Secondary | ICD-10-CM | POA: Insufficient documentation

## 2018-03-31 DIAGNOSIS — G4710 Hypersomnia, unspecified: Secondary | ICD-10-CM | POA: Diagnosis not present

## 2018-03-31 DIAGNOSIS — G4730 Sleep apnea, unspecified: Secondary | ICD-10-CM | POA: Diagnosis not present

## 2018-03-31 HISTORY — DX: Major depressive disorder, single episode, unspecified: F32.9

## 2018-03-31 HISTORY — DX: Depression, unspecified: F32.A

## 2018-03-31 HISTORY — DX: Headache: R51

## 2018-03-31 HISTORY — DX: Hyperlipidemia, unspecified: E78.5

## 2018-03-31 HISTORY — PX: COLONOSCOPY WITH PROPOFOL: SHX5780

## 2018-03-31 HISTORY — DX: Headache, unspecified: R51.9

## 2018-03-31 SURGERY — COLONOSCOPY WITH PROPOFOL
Anesthesia: General

## 2018-03-31 MED ORDER — PIPERACILLIN-TAZOBACTAM 3.375 G IVPB 30 MIN
3.3750 g | Freq: Once | INTRAVENOUS | Status: AC
Start: 2018-03-31 — End: 2018-03-31
  Administered 2018-03-31: 3.375 g via INTRAVENOUS
  Filled 2018-03-31: qty 50

## 2018-03-31 MED ORDER — PROPOFOL 10 MG/ML IV BOLUS
INTRAVENOUS | Status: DC | PRN
  Administered 2018-03-31: 100 mg via INTRAVENOUS

## 2018-03-31 MED ORDER — PROPOFOL 500 MG/50ML IV EMUL
INTRAVENOUS | Status: DC | PRN
  Administered 2018-03-31: 100 ug/kg/min via INTRAVENOUS

## 2018-03-31 MED ORDER — SODIUM CHLORIDE 0.9 % IV SOLN: INTRAVENOUS | Status: DC

## 2018-03-31 MED ORDER — PROPOFOL 10 MG/ML IV BOLUS
INTRAVENOUS | Status: AC
  Filled 2018-03-31: qty 20

## 2018-03-31 MED ORDER — PROPOFOL 500 MG/50ML IV EMUL
INTRAVENOUS | Status: AC
  Filled 2018-03-31: qty 50

## 2018-03-31 MED ORDER — SODIUM CHLORIDE 0.9 % IV SOLN
INTRAVENOUS | Status: DC
  Administered 2018-03-31: 13:00:00 via INTRAVENOUS

## 2018-03-31 MED ORDER — LIDOCAINE HCL (CARDIAC) 20 MG/ML IV SOLN
INTRAVENOUS | Status: DC | PRN
  Administered 2018-03-31: 50 mg via INTRATRACHEAL

## 2018-03-31 MED ORDER — LIDOCAINE HCL (PF) 2 % IJ SOLN
INTRAMUSCULAR | Status: AC
  Filled 2018-03-31: qty 10

## 2018-03-31 NOTE — Anesthesia Preprocedure Evaluation (Addendum)
Anesthesia Evaluation  Patient identified by MRN, date of birth, ID band Patient awake    Reviewed: Allergy & Precautions, H&P , NPO status , reviewed documented beta blocker date and time   Airway Mallampati: III  TM Distance: >3 FB Neck ROM: limited    Dental  (+) Caps, Teeth Intact   Pulmonary    Pulmonary exam normal        Cardiovascular hypertension, Normal cardiovascular exam  2017 Study Conclusions  - Left ventricle: The cavity size was normal. Systolic function was   normal. The estimated ejection fraction was 50%. Doppler   parameters are consistent with abnormal left ventricular   relaxation (grade 1 diastolic dysfunction). - Aortic valve: Valve area (Vmax): 2.23 cm^2. - Atrial septum: No defect or patent foramen ovale was identified.   Neuro/Psych  Headaches, Seizures -,  PSYCHIATRIC DISORDERS Depression [place]/Absence - last week, missed meds. Normally well controlled    GI/Hepatic GERD  ,  Endo/Other    Renal/GU      Musculoskeletal   Abdominal   Peds  Hematology   Anesthesia Other Findings Mild cognitive impairment 12/07/2017 LOC (loss of consciousness) 07/08/2017 Sleep disorder 07/08/2017 Seizure 05/08/2017 Dizziness 03/30/2017 Carotid artery disease 03/25/2017 Amnesia 08/06/2016 Overview:   Hospitalized 8/17 with onset headache, facial droop, memory changes. MRI of the brain unremarkable. Carotid Dopplers negative for flow-limiting disease. Question of TIA or atypical migraine  Diffuse cystic mastopathy of breast 02/24/2014 GERD (gastroesophageal reflux disease)  Hyperlipidemia, unspecified  Hypertension  RAD (reactive airway disease), unspecified  Depression, unspecified  Microscopic hematuria  Overview:   IVP and U/S performed 2009. Urology consultation 2009.  Allergic state  Arthritis  Migraines  Hypersomnia with sleep apnea  Vitamin D deficiency, unspecified  Reproductive/Obstetrics                           Anesthesia Physical Anesthesia Plan  ASA: III  Anesthesia Plan: General   Post-op Pain Management:    Induction:   PONV Risk Score and Plan: 3 and Propofol infusion  Airway Management Planned:   Additional Equipment:   Intra-op Plan:   Post-operative Plan:   Informed Consent: I have reviewed the patients History and Physical, chart, labs and discussed the procedure including the risks, benefits and alternatives for the proposed anesthesia with the patient or authorized representative who has indicated his/her understanding and acceptance.   Dental Advisory Given  Plan Discussed with: CRNA  Anesthesia Plan Comments:        Anesthesia Quick Evaluation

## 2018-03-31 NOTE — Transfer of Care (Signed)
Immediate Anesthesia Transfer of Care Note  Patient: Abigail Moody  Procedure(s) Performed: COLONOSCOPY WITH PROPOFOL (N/A )  Patient Location: PACU  Anesthesia Type:General  Level of Consciousness: awake, alert  and oriented  Airway & Oxygen Therapy: Patient Spontanous Breathing  Post-op Assessment: Report given to RN, Post -op Vital signs reviewed and stable and Patient moving all extremities  Post vital signs: Reviewed and stable  Last Vitals:  Vitals Value Taken Time  BP    Temp    Pulse 79 03/31/2018  1:46 PM  Resp 19 03/31/2018  1:46 PM  SpO2 94 % 03/31/2018  1:46 PM  Vitals shown include unvalidated device data.  Last Pain:  Vitals:   03/31/18 1245  TempSrc: Tympanic      Patients Stated Pain Goal: 0 (90/21/11 5520)  Complications: No apparent anesthesia complications

## 2018-03-31 NOTE — Anesthesia Postprocedure Evaluation (Signed)
Anesthesia Post Note  Patient: Abigail Moody  Procedure(s) Performed: COLONOSCOPY WITH PROPOFOL (N/A )  Patient location during evaluation: Endoscopy Anesthesia Type: General Level of consciousness: awake and alert, oriented and patient cooperative Pain management: satisfactory to patient Vital Signs Assessment: post-procedure vital signs reviewed and stable Respiratory status: spontaneous breathing and respiratory function stable Cardiovascular status: blood pressure returned to baseline and stable Postop Assessment: no headache, no backache, no apparent nausea or vomiting, adequate PO intake and patient able to bend at knees Anesthetic complications: no     Last Vitals:  Vitals:   03/31/18 1245 03/31/18 1345  BP: 125/84 110/63  Pulse: 71 65  Resp: 20   Temp: (!) 36.1 C (!) 36.1 C  SpO2: 99%     Last Pain:  Vitals:   03/31/18 1345  TempSrc: Tympanic  PainSc: 0-No pain                 Quinlan Vollmer H Kingsley Herandez

## 2018-03-31 NOTE — Op Note (Signed)
[facility] Gastroenterology Patient Name: Abigail Moody Procedure Date: 03/31/2018 1:12 PM MRN: [id number] Account #: [id number] Date of Birth: Jun 08, 1946 Admit Type: Outpatient Age: 72 Room: [facility] ENDO ROOM 3 Gender: Female Note Status: Finalized Procedure:            Colonoscopy Indications:          Screening for colorectal malignant neoplasm Providers:            Manya Silvas, MD Referring MD:         Ramonita Lab, MD (Referring MD) Medicines:            Propofol per Anesthesia Complications:        No immediate complications. Procedure:            Pre-Anesthesia Assessment:                       - After reviewing the risks and benefits, the patient                        was deemed in satisfactory condition to undergo the                        procedure.                       After obtaining informed consent, the colonoscope was                        passed under direct vision. Throughout the procedure,                        the patient's blood pressure, pulse, and oxygen                        saturations were monitored continuously. The                        Colonoscope was introduced through the anus and                        advanced to the the cecum, identified by appendiceal                        orifice and ileocecal valve. The colonoscopy was                        performed without difficulty. The patient tolerated the                        procedure well. The quality of the bowel preparation                        was excellent. Findings:      A diminutive polyp was found in the descending colon. The polyp was       sessile. The polyp was removed with a jumbo cold forceps. Resection and       retrieval were complete.      A diminutive polyp was found in the sigmoid colon. The polyp was       sessile. The polyp was removed with a jumbo cold forceps. Resection and  retrieval were complete.      Internal hemorrhoids were found  during endoscopy. The hemorrhoids were       small and Grade I (internal hemorrhoids that do not prolapse).      The exam was otherwise without abnormality. Impression:           - One diminutive polyp in the descending colon, removed                        with a jumbo cold forceps. Resected and retrieved.                       - One diminutive polyp in the sigmoid colon, removed                        with a jumbo cold forceps. Resected and retrieved.                       - Internal hemorrhoids.                       - The examination was otherwise normal. Recommendation:       - Await pathology results. Manya Silvas, MD 03/31/2018 1:43:31 PM This report has been signed electronically. Number of Addenda: 0 Note Initiated On: 03/31/2018 1:12 PM Scope Withdrawal Time: 0 hours 11 minutes 20 seconds  Total Procedure Duration: 0 hours 17 minutes 59 seconds       [facility]

## 2018-03-31 NOTE — H&P (Signed)
Primary Care Physician:  Adin Hector, MD Primary Gastroenterologist:  Dr. Vira Agar  Pre-Procedure History & Physical: HPI:  Abigail Moody is a 72 y.o. female is here for an screening colonoscopy.   Past Medical History:  Diagnosis Date  . Depression   . Diffuse cystic mastopathy   . GERD (gastroesophageal reflux disease)   . Headache   . Hemorrhoid   . Hyperlipidemia   . Hypertension   . Seizures ([place])     Past Surgical History:  Procedure Laterality Date  . ANKLE SURGERY Right 2006  . BREAST BIOPSY Bilateral   . CARPAL TUNNEL RELEASE Bilateral   . COLONOSCOPY  2008   Dr. Epifanio Lesches  . MOUTH SURGERY  2010  . TOE SURGERY Left 2006  . TONSILLECTOMY  1959  . TRIGGER FINGER RELEASE Right 2013-2014?    Prior to Admission medications   Medication Sig Start Date End Date Taking? Authorizing Provider  amLODipine (NORVASC) 5 MG tablet Take 5 mg by mouth daily.   Yes [provider]  aspirin 325 MG tablet Take 325 mg by mouth daily.   Yes [provider]  atorvastatin (LIPITOR) 10 MG tablet Take 10 mg by mouth daily.   Yes [provider]  Biotin 10 MG TABS Take by mouth.   Yes [provider]  calcium-vitamin D 250-100 MG-UNIT per tablet Take 1 tablet by mouth 2 (two) times daily.   Yes [provider]  escitalopram (LEXAPRO) 10 MG tablet Take 10 mg by mouth daily.   Yes [provider]  ferrous sulfate 325 (65 FE) MG tablet Take 325 mg by mouth daily with breakfast.   Yes [provider]  fluticasone (FLONASE) 50 MCG/ACT nasal spray  02/12/14  Yes [provider]  Multiple Vitamins-Minerals (MULTIVITAMIN WITH MINERALS) tablet Take 1 tablet by mouth daily.   Yes [provider]  omeprazole (PRILOSEC) 20 MG capsule Take 20 mg by mouth daily.   Yes [provider]  PROAIR HFA 108 (90 BASE) MCG/ACT inhaler  01/28/14  Yes [provider]    Allergies as of 03/30/2018 - Review  Complete 03/30/2018  Allergen Reaction Noted  . Erythromycin Itching 02/23/2014  . Levaquin [levofloxacin] Other (See Comments) 02/23/2014    Family History  Problem Relation Age of Onset  . Cancer Father        prostate  . Cancer Brother        half brother/lung  . Breast cancer Other     Social History   Socioeconomic History  . Marital status: Divorced    Spouse name: Not on file  . Number of children: Not on file  . Years of education: Not on file  . Highest education level: Not on file  Occupational History  . Not on file  Social Needs  . Financial resource strain: Not on file  . Food insecurity:    Worry: Not on file    Inability: Not on file  . Transportation needs:    Medical: Not on file    Non-medical: Not on file  Tobacco Use  . Smoking status: Never Smoker  . Smokeless tobacco: Never Used  Substance and Sexual Activity  . Alcohol use: Yes  . Drug use: No  . Sexual activity: Not on file  Lifestyle  . Physical activity:    Days per week: Not on file    Minutes per session: Not on file  . Stress: Not on file  Relationships  .  Social connections:    Talks on phone: Not on file    Gets together: Not on file    Attends religious service: Not on file    Active member of club or organization: Not on file    Attends meetings of clubs or organizations: Not on file    Relationship status: Not on file  . Intimate partner violence:    Fear of current or ex partner: Not on file    Emotionally abused: Not on file    Physically abused: Not on file    Forced sexual activity: Not on file  Other Topics Concern  . Not on file  Social History Narrative  . Not on file    Review of Systems: See HPI, otherwise negative ROS  Physical Exam: BP 125/84   Pulse 71   Temp (!) 97 F (36.1 C) (Tympanic)   Resp 20   Ht 5\' 5"  (1.651 m)   Wt 90.7 kg (200 lb)   SpO2 99%   BMI 33.28 kg/m  General:   Alert,  pleasant and cooperative in NAD Head:  Normocephalic and  atraumatic. Neck:  Supple; no masses or thyromegaly. Lungs:  Clear throughout to auscultation.    Heart:  Regular rate and rhythm. Abdomen:  Soft, nontender and nondistended. Normal bowel sounds, without guarding, and without rebound.   Neurologic:  Alert and  oriented x4;  grossly normal neurologically.  Impression/Plan: [name] is here for an colonoscopy to be performed for screening colonoscopy.  Risks, benefits, limitations, and alternatives regarding  colonoscopy have been reviewed with the patient.  Questions have been answered.  All parties agreeable.   Gaylyn Cheers, MD  03/31/2018, 1:15 PM

## 2018-03-31 NOTE — Anesthesia Post-op Follow-up Note (Signed)
Anesthesia QCDR form completed.        

## 2018-04-01 ENCOUNTER — Encounter: Payer: Self-pay | Admitting: Unknown Physician Specialty

## 2018-04-01 LAB — SURGICAL PATHOLOGY

## 2018-04-07 DIAGNOSIS — G3184 Mild cognitive impairment, so stated: Secondary | ICD-10-CM | POA: Diagnosis not present

## 2018-04-07 DIAGNOSIS — R413 Other amnesia: Secondary | ICD-10-CM | POA: Diagnosis not present

## 2018-04-07 DIAGNOSIS — R4020 Unspecified coma: Secondary | ICD-10-CM | POA: Diagnosis not present

## 2018-04-07 DIAGNOSIS — M1990 Unspecified osteoarthritis, unspecified site: Secondary | ICD-10-CM | POA: Diagnosis not present

## 2018-04-07 DIAGNOSIS — R569 Unspecified convulsions: Secondary | ICD-10-CM | POA: Diagnosis not present

## 2018-04-07 DIAGNOSIS — G479 Sleep disorder, unspecified: Secondary | ICD-10-CM | POA: Diagnosis not present

## 2018-06-08 DIAGNOSIS — Z23 Encounter for immunization: Secondary | ICD-10-CM | POA: Diagnosis not present

## 2018-06-08 DIAGNOSIS — K219 Gastro-esophageal reflux disease without esophagitis: Secondary | ICD-10-CM | POA: Diagnosis not present

## 2018-06-08 DIAGNOSIS — J4520 Mild intermittent asthma, uncomplicated: Secondary | ICD-10-CM | POA: Diagnosis not present

## 2018-06-08 DIAGNOSIS — I10 Essential (primary) hypertension: Secondary | ICD-10-CM | POA: Diagnosis not present

## 2018-06-08 DIAGNOSIS — G4710 Hypersomnia, unspecified: Secondary | ICD-10-CM | POA: Diagnosis not present

## 2018-06-08 DIAGNOSIS — R569 Unspecified convulsions: Secondary | ICD-10-CM | POA: Diagnosis not present

## 2018-06-08 DIAGNOSIS — I779 Disorder of arteries and arterioles, unspecified: Secondary | ICD-10-CM | POA: Diagnosis not present

## 2018-06-08 DIAGNOSIS — F3342 Major depressive disorder, recurrent, in full remission: Secondary | ICD-10-CM | POA: Diagnosis not present

## 2018-06-08 DIAGNOSIS — N183 Chronic kidney disease, stage 3 (moderate): Secondary | ICD-10-CM | POA: Diagnosis not present

## 2018-06-08 DIAGNOSIS — E559 Vitamin D deficiency, unspecified: Secondary | ICD-10-CM | POA: Diagnosis not present

## 2018-06-08 DIAGNOSIS — Z0000 Encounter for general adult medical examination without abnormal findings: Secondary | ICD-10-CM | POA: Diagnosis not present

## 2018-06-08 DIAGNOSIS — E7849 Other hyperlipidemia: Secondary | ICD-10-CM | POA: Diagnosis not present

## 2018-06-08 DIAGNOSIS — R3129 Other microscopic hematuria: Secondary | ICD-10-CM | POA: Diagnosis not present

## 2018-06-09 DIAGNOSIS — R569 Unspecified convulsions: Secondary | ICD-10-CM | POA: Diagnosis not present

## 2018-06-09 DIAGNOSIS — M1990 Unspecified osteoarthritis, unspecified site: Secondary | ICD-10-CM | POA: Diagnosis not present

## 2018-06-09 DIAGNOSIS — G479 Sleep disorder, unspecified: Secondary | ICD-10-CM | POA: Diagnosis not present

## 2018-06-09 DIAGNOSIS — R413 Other amnesia: Secondary | ICD-10-CM | POA: Diagnosis not present

## 2018-07-08 DIAGNOSIS — H2513 Age-related nuclear cataract, bilateral: Secondary | ICD-10-CM | POA: Diagnosis not present

## 2018-07-21 DIAGNOSIS — G4733 Obstructive sleep apnea (adult) (pediatric): Secondary | ICD-10-CM | POA: Diagnosis not present

## 2018-07-26 DIAGNOSIS — M79644 Pain in right finger(s): Secondary | ICD-10-CM | POA: Diagnosis not present

## 2018-07-26 DIAGNOSIS — E669 Obesity, unspecified: Secondary | ICD-10-CM | POA: Diagnosis not present

## 2018-07-26 DIAGNOSIS — S62644A Nondisplaced fracture of proximal phalanx of right ring finger, initial encounter for closed fracture: Secondary | ICD-10-CM | POA: Diagnosis not present

## 2018-08-16 DIAGNOSIS — D2272 Melanocytic nevi of left lower limb, including hip: Secondary | ICD-10-CM | POA: Diagnosis not present

## 2018-08-16 DIAGNOSIS — L814 Other melanin hyperpigmentation: Secondary | ICD-10-CM | POA: Diagnosis not present

## 2018-08-16 DIAGNOSIS — D2262 Melanocytic nevi of left upper limb, including shoulder: Secondary | ICD-10-CM | POA: Diagnosis not present

## 2018-08-16 DIAGNOSIS — L821 Other seborrheic keratosis: Secondary | ICD-10-CM | POA: Diagnosis not present

## 2018-08-16 DIAGNOSIS — D2271 Melanocytic nevi of right lower limb, including hip: Secondary | ICD-10-CM | POA: Diagnosis not present

## 2018-08-16 DIAGNOSIS — D2261 Melanocytic nevi of right upper limb, including shoulder: Secondary | ICD-10-CM | POA: Diagnosis not present

## 2018-10-27 DIAGNOSIS — R413 Other amnesia: Secondary | ICD-10-CM | POA: Diagnosis not present

## 2018-10-27 DIAGNOSIS — R569 Unspecified convulsions: Secondary | ICD-10-CM | POA: Diagnosis not present

## 2018-10-27 DIAGNOSIS — G479 Sleep disorder, unspecified: Secondary | ICD-10-CM | POA: Diagnosis not present

## 2018-12-01 DIAGNOSIS — I739 Peripheral vascular disease, unspecified: Secondary | ICD-10-CM | POA: Diagnosis not present

## 2018-12-01 DIAGNOSIS — R3129 Other microscopic hematuria: Secondary | ICD-10-CM | POA: Diagnosis not present

## 2018-12-01 DIAGNOSIS — E7849 Other hyperlipidemia: Secondary | ICD-10-CM | POA: Diagnosis not present

## 2018-12-01 DIAGNOSIS — N183 Chronic kidney disease, stage 3 (moderate): Secondary | ICD-10-CM | POA: Diagnosis not present

## 2018-12-01 DIAGNOSIS — K219 Gastro-esophageal reflux disease without esophagitis: Secondary | ICD-10-CM | POA: Diagnosis not present

## 2018-12-01 DIAGNOSIS — I10 Essential (primary) hypertension: Secondary | ICD-10-CM | POA: Diagnosis not present

## 2018-12-09 DIAGNOSIS — R3129 Other microscopic hematuria: Secondary | ICD-10-CM | POA: Diagnosis not present

## 2018-12-09 DIAGNOSIS — G3184 Mild cognitive impairment, so stated: Secondary | ICD-10-CM | POA: Diagnosis not present

## 2018-12-09 DIAGNOSIS — I739 Peripheral vascular disease, unspecified: Secondary | ICD-10-CM | POA: Diagnosis not present

## 2018-12-09 DIAGNOSIS — I10 Essential (primary) hypertension: Secondary | ICD-10-CM | POA: Diagnosis not present

## 2018-12-09 DIAGNOSIS — N183 Chronic kidney disease, stage 3 (moderate): Secondary | ICD-10-CM | POA: Diagnosis not present

## 2018-12-09 DIAGNOSIS — K219 Gastro-esophageal reflux disease without esophagitis: Secondary | ICD-10-CM | POA: Diagnosis not present

## 2018-12-09 DIAGNOSIS — R569 Unspecified convulsions: Secondary | ICD-10-CM | POA: Diagnosis not present

## 2018-12-09 DIAGNOSIS — E559 Vitamin D deficiency, unspecified: Secondary | ICD-10-CM | POA: Diagnosis not present

## 2018-12-09 DIAGNOSIS — E7849 Other hyperlipidemia: Secondary | ICD-10-CM | POA: Diagnosis not present

## 2019-02-14 ENCOUNTER — Other Ambulatory Visit: Payer: Self-pay | Admitting: Internal Medicine

## 2019-02-14 DIAGNOSIS — Z1231 Encounter for screening mammogram for malignant neoplasm of breast: Secondary | ICD-10-CM

## 2019-02-24 ENCOUNTER — Ambulatory Visit
Admission: RE | Admit: 2019-02-24 | Discharge: 2019-02-24 | Disposition: A | Discharge status: Home / Self Care | Payer: Medicare Other | Source: Ambulatory Visit | Attending: Internal Medicine | Admitting: Internal Medicine

## 2019-02-24 DIAGNOSIS — Z1231 Encounter for screening mammogram for malignant neoplasm of breast: Secondary | ICD-10-CM | POA: Insufficient documentation

## 2019-05-26 ENCOUNTER — Other Ambulatory Visit: Payer: Self-pay | Admitting: Internal Medicine

## 2019-05-26 DIAGNOSIS — Z1231 Encounter for screening mammogram for malignant neoplasm of breast: Secondary | ICD-10-CM

## 2019-07-06 ENCOUNTER — Ambulatory Visit
Admission: RE | Admit: 2019-07-06 | Discharge: 2019-07-06 | Disposition: A | Discharge status: Home / Self Care | Payer: Medicare Other | Source: Ambulatory Visit | Attending: Internal Medicine | Admitting: Internal Medicine

## 2019-07-06 ENCOUNTER — Other Ambulatory Visit: Payer: Self-pay

## 2019-07-06 DIAGNOSIS — Z1231 Encounter for screening mammogram for malignant neoplasm of breast: Secondary | ICD-10-CM | POA: Insufficient documentation

## 2020-03-06 ENCOUNTER — Ambulatory Visit: Payer: Self-pay | Admitting: Dermatology

## 2020-04-05 ENCOUNTER — Ambulatory Visit: Payer: Medicare Other | Attending: Internal Medicine

## 2020-04-05 DIAGNOSIS — Z20822 Contact with and (suspected) exposure to covid-19: Secondary | ICD-10-CM

## 2020-04-06 LAB — SARS-COV-2, NAA 2 DAY TAT

## 2020-04-06 LAB — NOVEL CORONAVIRUS, NAA: SARS-CoV-2, NAA: NOT DETECTED

## 2020-05-28 ENCOUNTER — Other Ambulatory Visit: Payer: Self-pay | Admitting: Internal Medicine

## 2020-06-06 ENCOUNTER — Encounter: Payer: Self-pay | Admitting: Ophthalmology

## 2020-06-06 ENCOUNTER — Other Ambulatory Visit: Payer: Self-pay

## 2020-06-08 ENCOUNTER — Other Ambulatory Visit
Admission: RE | Admit: 2020-06-08 | Discharge: 2020-06-08 | Disposition: A | Discharge status: Home / Self Care | Payer: Medicare Other | Source: Ambulatory Visit | Attending: Ophthalmology | Admitting: Ophthalmology

## 2020-06-08 ENCOUNTER — Other Ambulatory Visit: Payer: Self-pay

## 2020-06-08 DIAGNOSIS — Z01812 Encounter for preprocedural laboratory examination: Secondary | ICD-10-CM | POA: Diagnosis present

## 2020-06-08 DIAGNOSIS — Z20822 Contact with and (suspected) exposure to covid-19: Secondary | ICD-10-CM | POA: Diagnosis not present

## 2020-06-08 LAB — SARS CORONAVIRUS 2 (TAT 6-24 HRS): SARS Coronavirus 2: NEGATIVE

## 2020-06-08 NOTE — Discharge Instructions (Signed)

## 2020-06-12 ENCOUNTER — Encounter: Payer: Self-pay | Admitting: Ophthalmology

## 2020-06-12 ENCOUNTER — Ambulatory Visit: Payer: Medicare Other | Admitting: Anesthesiology

## 2020-06-12 ENCOUNTER — Encounter
Admission: RE | Disposition: A | Discharge status: Home / Self Care | Payer: Self-pay | Source: Home / Self Care | Attending: Ophthalmology

## 2020-06-12 ENCOUNTER — Other Ambulatory Visit: Payer: Self-pay

## 2020-06-12 ENCOUNTER — Ambulatory Visit
Admission: RE | Admit: 2020-06-12 | Discharge: 2020-06-12 | Disposition: A | Discharge status: Home / Self Care | Payer: Medicare Other | Attending: Ophthalmology | Admitting: Ophthalmology

## 2020-06-12 DIAGNOSIS — Z79899 Other long term (current) drug therapy: Secondary | ICD-10-CM | POA: Insufficient documentation

## 2020-06-12 DIAGNOSIS — K219 Gastro-esophageal reflux disease without esophagitis: Secondary | ICD-10-CM | POA: Diagnosis not present

## 2020-06-12 DIAGNOSIS — M1990 Unspecified osteoarthritis, unspecified site: Secondary | ICD-10-CM | POA: Insufficient documentation

## 2020-06-12 DIAGNOSIS — H2511 Age-related nuclear cataract, right eye: Secondary | ICD-10-CM | POA: Diagnosis present

## 2020-06-12 DIAGNOSIS — F329 Major depressive disorder, single episode, unspecified: Secondary | ICD-10-CM | POA: Insufficient documentation

## 2020-06-12 DIAGNOSIS — Z882 Allergy status to sulfonamides status: Secondary | ICD-10-CM | POA: Diagnosis not present

## 2020-06-12 DIAGNOSIS — E669 Obesity, unspecified: Secondary | ICD-10-CM | POA: Diagnosis not present

## 2020-06-12 DIAGNOSIS — Z6835 Body mass index (BMI) 35.0-35.9, adult: Secondary | ICD-10-CM | POA: Diagnosis not present

## 2020-06-12 DIAGNOSIS — Z7982 Long term (current) use of aspirin: Secondary | ICD-10-CM | POA: Diagnosis not present

## 2020-06-12 DIAGNOSIS — G4730 Sleep apnea, unspecified: Secondary | ICD-10-CM | POA: Insufficient documentation

## 2020-06-12 DIAGNOSIS — I10 Essential (primary) hypertension: Secondary | ICD-10-CM | POA: Insufficient documentation

## 2020-06-12 DIAGNOSIS — R569 Unspecified convulsions: Secondary | ICD-10-CM | POA: Insufficient documentation

## 2020-06-12 HISTORY — PX: CATARACT EXTRACTION W/PHACO: SHX586

## 2020-06-12 SURGERY — PHACOEMULSIFICATION, CATARACT, WITH IOL INSERTION
Anesthesia: Monitor Anesthesia Care | Site: Eye | Laterality: Right

## 2020-06-12 MED ORDER — ACETAMINOPHEN 325 MG PO TABS: 325.0000 mg | ORAL_TABLET | ORAL | Status: DC | PRN

## 2020-06-12 MED ORDER — LIDOCAINE HCL (PF) 2 % IJ SOLN
INTRAOCULAR | Status: DC | PRN
  Administered 2020-06-12: 2 mL

## 2020-06-12 MED ORDER — TETRACAINE HCL 0.5 % OP SOLN
1.0000 [drp] | OPHTHALMIC | Status: DC | PRN
  Administered 2020-06-12 (×3): 1 [drp] via OPHTHALMIC

## 2020-06-12 MED ORDER — ARMC OPHTHALMIC DILATING DROPS
1.0000 "application " | OPHTHALMIC | Status: DC | PRN
  Administered 2020-06-12 (×3): 1 via OPHTHALMIC

## 2020-06-12 MED ORDER — ACETAMINOPHEN 160 MG/5ML PO SOLN: 325.0000 mg | ORAL | Status: DC | PRN

## 2020-06-12 MED ORDER — BRIMONIDINE TARTRATE-TIMOLOL 0.2-0.5 % OP SOLN
OPHTHALMIC | Status: DC | PRN
  Administered 2020-06-12: 1 [drp] via OPHTHALMIC

## 2020-06-12 MED ORDER — ONDANSETRON HCL 4 MG/2ML IJ SOLN: 4.0000 mg | Freq: Once | INTRAMUSCULAR | Status: DC | PRN

## 2020-06-12 MED ORDER — EPINEPHRINE PF 1 MG/ML IJ SOLN
INTRAOCULAR | Status: DC | PRN
  Administered 2020-06-12: 40 mL via OPHTHALMIC

## 2020-06-12 MED ORDER — MIDAZOLAM HCL 2 MG/2ML IJ SOLN
INTRAMUSCULAR | Status: DC | PRN
  Administered 2020-06-12: 1 mg via INTRAVENOUS

## 2020-06-12 MED ORDER — POLYMYXIN B-TRIMETHOPRIM 10000-0.1 UNIT/ML-% OP SOLN
OPHTHALMIC | Status: DC | PRN
  Administered 2020-06-12: 1 [drp] via OPHTHALMIC

## 2020-06-12 MED ORDER — FENTANYL CITRATE (PF) 100 MCG/2ML IJ SOLN
INTRAMUSCULAR | Status: DC | PRN
  Administered 2020-06-12: 50 ug via INTRAVENOUS

## 2020-06-12 MED ORDER — CEFUROXIME OPHTHALMIC INJECTION 1 MG/0.1 ML
INJECTION | OPHTHALMIC | Status: DC | PRN
  Administered 2020-06-12: 0.1 mL via INTRACAMERAL

## 2020-06-12 MED ORDER — NA CHONDROIT SULF-NA HYALURON 40-17 MG/ML IO SOLN
INTRAOCULAR | Status: DC | PRN
  Administered 2020-06-12: 1 mL via INTRAOCULAR

## 2020-06-12 SURGICAL SUPPLY — 21 items
CANNULA ANT/CHMB 27G (MISCELLANEOUS) ×2 IMPLANT
CANNULA ANT/CHMB 27GA (MISCELLANEOUS) ×6 IMPLANT
GLOVE SURG LX 8.0 MICRO (GLOVE) ×2
GLOVE SURG LX STRL 8.0 MICRO (GLOVE) ×1 IMPLANT
GLOVE SURG TRIUMPH 8.0 PF LTX (GLOVE) ×3 IMPLANT
GOWN STRL REUS W/ TWL LRG LVL3 (GOWN DISPOSABLE) ×2 IMPLANT
GOWN STRL REUS W/TWL LRG LVL3 (GOWN DISPOSABLE) ×4
LENS IOL ACRSF IQ TRC 3 20.0 IMPLANT
LENS IOL ACRYSOF IQ TORIC 20.0 ×2 IMPLANT
LENS IOL IQ TORIC 3 20.0 ×1 IMPLANT
MARKER SKIN DUAL TIP RULER LAB (MISCELLANEOUS) ×3 IMPLANT
NDL FILTER BLUNT 18X1 1/2 (NEEDLE) ×1 IMPLANT
NEEDLE FILTER BLUNT 18X 1/2SAF (NEEDLE) ×2
NEEDLE FILTER BLUNT 18X1 1/2 (NEEDLE) ×1 IMPLANT
PACK EYE AFTER SURG (MISCELLANEOUS) ×3 IMPLANT
PACK OPTHALMIC (MISCELLANEOUS) ×3 IMPLANT
PACK PORFILIO (MISCELLANEOUS) ×3 IMPLANT
SYR 3ML LL SCALE MARK (SYRINGE) ×3 IMPLANT
SYR TB 1ML LUER SLIP (SYRINGE) ×3 IMPLANT
WATER STERILE IRR 250ML POUR (IV SOLUTION) ×3 IMPLANT
WIPE NON LINTING 3.25X3.25 (MISCELLANEOUS) ×3 IMPLANT

## 2020-06-12 NOTE — Op Note (Signed)
PREOPERATIVE DIAGNOSIS:  Nuclear sclerotic cataract of the right eye.   POSTOPERATIVE DIAGNOSIS:  Nuclear sclerotic cataract of the right eye.   OPERATIVE PROCEDURE:  Procedure(s): CATARACT EXTRACTION PHACO AND INTRAOCULAR LENS PLACEMENT (IOC) RIGHT TORIC LENS 5.27 00:34.5    SURGEON:  Birder Robson, MD.   ANESTHESIA: 1.      Managed anesthesia care. 2.      Topical tetracaine drops followed by 2% Xylocaine jelly applied in the preoperative holding area.  Anesthesiologist: Veda Canning, MD CRNA: Cameron Ali, CRNA   COMPLICATIONS:  None.   TECHNIQUE:   Stop and chop    DESCRIPTION OF PROCEDURE:  The patient was examined and consented in the preoperative holding area where the aforementioned topical anesthesia was applied to the right eye.  The patient was brought back to the Operating Room where he was sat upright on the gurney and given a target to fixate upon while the eye was marked at the 3:00 and 9:00 position.  The patient was then reclined on the operating table.  The eye was prepped and draped in the usual sterile ophthalmic fashion and a lid speculum was placed. A paracentesis was created with the side port blade and the anterior chamber was filled with viscoelastic. A near clear corneal incision was performed with the steel keratome. A continuous curvilinear capsulorrhexis was performed with a cystotome followed by the capsulorrhexis forceps. Hydrodissection and hydrodelineation were carried out with BSS on a blunt cannula. The lens was removed in a stop and chop technique and the remaining cortical material was removed with the irrigation-aspiration handpiece. The eye was inflated with viscoelastic and the [id number] lens  was placed in the eye and rotated to within a few degrees of the predetermined orientation.  The remaining viscoelastic was removed from the eye.  The Sinskey hook was used to rotate the toric lens into its final resting place at 021 degrees.  0.1 mL of cefuroxime  concentration 10 mg/mL was placed in the anterior chamber. The eye was inflated to a physiologic pressure and found to be watertight.  The eye was dressed with Vigamox. The patient was given protective glasses to wear throughout the day and a shield with which to sleep tonight. The patient was also given drops with which to begin a drop regimen today and will follow-up with me in one day. Implant Name Type Inv. Item Serial No. Manufacturer Lot No. LRB No. Used Action  LENS IOL TORIC 20.0 - [id number]  LENS IOL TORIC 20.0 [id number] ALCON  Right 1 Implanted   Procedure(s) with comments: CATARACT EXTRACTION PHACO AND INTRAOCULAR LENS PLACEMENT (IOC) RIGHT TORIC LENS 5.27 00:34.5  (Right) - Latex  Electronically signed: Birder Robson 06/12/2020 8:45 AM

## 2020-06-12 NOTE — Anesthesia Postprocedure Evaluation (Signed)
Anesthesia Post Note  Patient: Abigail Moody  Procedure(s) Performed: CATARACT EXTRACTION PHACO AND INTRAOCULAR LENS PLACEMENT (IOC) RIGHT TORIC LENS 5.27 00:34.5  (Right Eye)     Patient location during evaluation: PACU Anesthesia Type: MAC Level of consciousness: awake Pain management: pain level controlled Vital Signs Assessment: post-procedure vital signs reviewed and stable Respiratory status: respiratory function stable Cardiovascular status: stable Postop Assessment: no apparent nausea or vomiting Anesthetic complications: no   No complications documented.  Veda Canning

## 2020-06-12 NOTE — Transfer of Care (Signed)
Immediate Anesthesia Transfer of Care Note  Patient: Abigail Moody  Procedure(s) Performed: CATARACT EXTRACTION PHACO AND INTRAOCULAR LENS PLACEMENT (IOC) RIGHT TORIC LENS 5.27 00:34.5  (Right Eye)  Patient Location: PACU  Anesthesia Type: MAC  Level of Consciousness: awake, alert  and patient cooperative  Airway and Oxygen Therapy: Patient Spontanous Breathing and Patient connected to supplemental oxygen  Post-op Assessment: Post-op Vital signs reviewed, Patient's Cardiovascular Status Stable, Respiratory Function Stable, Patent Airway and No signs of Nausea or vomiting  Post-op Vital Signs: Reviewed and stable  Complications: No complications documented.

## 2020-06-12 NOTE — H&P (Signed)
All labs reviewed. Abnormal studies sent to patients PCP when indicated.  Previous H&P reviewed, patient examined, there are NO CHANGES.  Abigail Moody Porfilio6/22/20218:18 AM

## 2020-06-12 NOTE — Anesthesia Procedure Notes (Signed)
Procedure Name: MAC Date/Time: 06/12/2020 8:26 AM Performed by: Cameron Ali, CRNA Pre-anesthesia Checklist: Patient identified, Emergency Drugs available, Suction available, Timeout performed and Patient being monitored Patient Re-evaluated:Patient Re-evaluated prior to induction Oxygen Delivery Method: Nasal cannula Placement Confirmation: positive ETCO2

## 2020-06-12 NOTE — Anesthesia Preprocedure Evaluation (Signed)
Anesthesia Evaluation  Patient identified by MRN, date of birth, ID band Patient awake    Reviewed: Allergy & Precautions, NPO status   Airway Mallampati: II  TM Distance: >3 FB     Dental   Pulmonary    breath sounds clear to auscultation       Cardiovascular hypertension,  Rhythm:Regular Rate:Normal  HLD   Neuro/Psych  Headaches, Seizures -,  Depression    GI/Hepatic GERD  ,  Endo/Other  Obesity - BMI 35  Renal/GU      Musculoskeletal   Abdominal   Peds  Hematology   Anesthesia Other Findings   Reproductive/Obstetrics                             Anesthesia Physical Anesthesia Plan  ASA: III  Anesthesia Plan: MAC   Post-op Pain Management:    Induction: Intravenous  PONV Risk Score and Plan: TIVA, Midazolam and Treatment may vary due to age or medical condition  Airway Management Planned: Natural Airway and Nasal Cannula  Additional Equipment:   Intra-op Plan:   Post-operative Plan:   Informed Consent: I have reviewed the patients History and Physical, chart, labs and discussed the procedure including the risks, benefits and alternatives for the proposed anesthesia with the patient or authorized representative who has indicated his/her understanding and acceptance.       Plan Discussed with: CRNA  Anesthesia Plan Comments:         Anesthesia Quick Evaluation

## 2020-06-13 ENCOUNTER — Other Ambulatory Visit: Payer: Self-pay | Admitting: Internal Medicine

## 2020-06-13 ENCOUNTER — Encounter: Payer: Self-pay | Admitting: Ophthalmology

## 2020-06-13 DIAGNOSIS — Z1231 Encounter for screening mammogram for malignant neoplasm of breast: Secondary | ICD-10-CM

## 2020-07-10 ENCOUNTER — Ambulatory Visit
Admission: RE | Admit: 2020-07-10 | Discharge: 2020-07-10 | Disposition: A | Discharge status: Home / Self Care | Payer: Medicare Other | Source: Ambulatory Visit | Attending: Internal Medicine | Admitting: Internal Medicine

## 2020-07-10 DIAGNOSIS — Z1231 Encounter for screening mammogram for malignant neoplasm of breast: Secondary | ICD-10-CM | POA: Insufficient documentation

## 2020-07-26 ENCOUNTER — Encounter: Payer: Self-pay | Admitting: Ophthalmology

## 2020-07-26 ENCOUNTER — Other Ambulatory Visit: Payer: Self-pay

## 2020-07-26 NOTE — Discharge Instructions (Signed)

## 2020-07-27 ENCOUNTER — Other Ambulatory Visit
Admission: RE | Admit: 2020-07-27 | Discharge: 2020-07-27 | Disposition: A | Discharge status: Home / Self Care | Payer: Medicare HMO | Source: Ambulatory Visit | Attending: Ophthalmology | Admitting: Ophthalmology

## 2020-07-27 DIAGNOSIS — Z20822 Contact with and (suspected) exposure to covid-19: Secondary | ICD-10-CM | POA: Diagnosis not present

## 2020-07-27 DIAGNOSIS — Z01812 Encounter for preprocedural laboratory examination: Secondary | ICD-10-CM | POA: Diagnosis not present

## 2020-07-28 LAB — SARS CORONAVIRUS 2 (TAT 6-24 HRS): SARS Coronavirus 2: NEGATIVE

## 2020-07-31 ENCOUNTER — Ambulatory Visit: Payer: Medicare HMO | Admitting: Anesthesiology

## 2020-07-31 ENCOUNTER — Ambulatory Visit
Admission: RE | Admit: 2020-07-31 | Discharge: 2020-07-31 | Disposition: A | Discharge status: Home / Self Care | Payer: Medicare HMO | Attending: Ophthalmology | Admitting: Ophthalmology

## 2020-07-31 ENCOUNTER — Encounter: Payer: Self-pay | Admitting: Ophthalmology

## 2020-07-31 ENCOUNTER — Other Ambulatory Visit: Payer: Self-pay

## 2020-07-31 ENCOUNTER — Encounter
Admission: RE | Disposition: A | Discharge status: Home / Self Care | Payer: Medicare HMO | Source: Home / Self Care | Attending: Ophthalmology

## 2020-07-31 DIAGNOSIS — H2512 Age-related nuclear cataract, left eye: Secondary | ICD-10-CM | POA: Diagnosis not present

## 2020-07-31 DIAGNOSIS — Z79899 Other long term (current) drug therapy: Secondary | ICD-10-CM | POA: Insufficient documentation

## 2020-07-31 DIAGNOSIS — Z881 Allergy status to other antibiotic agents status: Secondary | ICD-10-CM | POA: Diagnosis not present

## 2020-07-31 DIAGNOSIS — E7800 Pure hypercholesterolemia, unspecified: Secondary | ICD-10-CM | POA: Insufficient documentation

## 2020-07-31 DIAGNOSIS — N189 Chronic kidney disease, unspecified: Secondary | ICD-10-CM | POA: Diagnosis not present

## 2020-07-31 DIAGNOSIS — I129 Hypertensive chronic kidney disease with stage 1 through stage 4 chronic kidney disease, or unspecified chronic kidney disease: Secondary | ICD-10-CM | POA: Insufficient documentation

## 2020-07-31 DIAGNOSIS — F329 Major depressive disorder, single episode, unspecified: Secondary | ICD-10-CM | POA: Diagnosis not present

## 2020-07-31 DIAGNOSIS — G4730 Sleep apnea, unspecified: Secondary | ICD-10-CM | POA: Diagnosis not present

## 2020-07-31 DIAGNOSIS — M1990 Unspecified osteoarthritis, unspecified site: Secondary | ICD-10-CM | POA: Diagnosis not present

## 2020-07-31 DIAGNOSIS — R569 Unspecified convulsions: Secondary | ICD-10-CM | POA: Diagnosis not present

## 2020-07-31 DIAGNOSIS — Z7982 Long term (current) use of aspirin: Secondary | ICD-10-CM | POA: Insufficient documentation

## 2020-07-31 DIAGNOSIS — H25812 Combined forms of age-related cataract, left eye: Secondary | ICD-10-CM | POA: Diagnosis not present

## 2020-07-31 HISTORY — PX: CATARACT EXTRACTION W/PHACO: SHX586

## 2020-07-31 SURGERY — PHACOEMULSIFICATION, CATARACT, WITH IOL INSERTION
Anesthesia: Monitor Anesthesia Care | Site: Eye | Laterality: Left

## 2020-07-31 MED ORDER — EPINEPHRINE PF 1 MG/ML IJ SOLN
INTRAOCULAR | Status: DC | PRN
  Administered 2020-07-31: 32 mL via OPHTHALMIC

## 2020-07-31 MED ORDER — ARMC OPHTHALMIC DILATING DROPS
1.0000 "application " | OPHTHALMIC | Status: DC | PRN
  Administered 2020-07-31 (×3): 1 via OPHTHALMIC

## 2020-07-31 MED ORDER — FENTANYL CITRATE (PF) 100 MCG/2ML IJ SOLN
INTRAMUSCULAR | Status: DC | PRN
  Administered 2020-07-31: 50 ug via INTRAVENOUS

## 2020-07-31 MED ORDER — LIDOCAINE HCL (PF) 2 % IJ SOLN
INTRAOCULAR | Status: DC | PRN
  Administered 2020-07-31: 1 mL

## 2020-07-31 MED ORDER — BRIMONIDINE TARTRATE-TIMOLOL 0.2-0.5 % OP SOLN
OPHTHALMIC | Status: DC | PRN
  Administered 2020-07-31: 1 [drp] via OPHTHALMIC

## 2020-07-31 MED ORDER — TETRACAINE HCL 0.5 % OP SOLN
1.0000 [drp] | OPHTHALMIC | Status: DC | PRN
  Administered 2020-07-31 (×3): 1 [drp] via OPHTHALMIC

## 2020-07-31 MED ORDER — MIDAZOLAM HCL 2 MG/2ML IJ SOLN
INTRAMUSCULAR | Status: DC | PRN
  Administered 2020-07-31: 1 mg via INTRAVENOUS

## 2020-07-31 MED ORDER — MOXIFLOXACIN HCL 0.5 % OP SOLN
OPHTHALMIC | Status: DC | PRN
  Administered 2020-07-31: 0.2 mL via OPHTHALMIC

## 2020-07-31 MED ORDER — NA CHONDROIT SULF-NA HYALURON 40-17 MG/ML IO SOLN
INTRAOCULAR | Status: DC | PRN
  Administered 2020-07-31: 1 mL via INTRAOCULAR

## 2020-07-31 SURGICAL SUPPLY — 20 items
CANNULA ANT/CHMB 27G (MISCELLANEOUS) ×2 IMPLANT
CANNULA ANT/CHMB 27GA (MISCELLANEOUS) ×6 IMPLANT
GLOVE SURG LX 8.0 MICRO (GLOVE) ×2
GLOVE SURG LX STRL 8.0 MICRO (GLOVE) ×1 IMPLANT
GLOVE SURG TRIUMPH 8.0 PF LTX (GLOVE) ×3 IMPLANT
GOWN STRL REUS W/ TWL LRG LVL3 (GOWN DISPOSABLE) ×2 IMPLANT
GOWN STRL REUS W/TWL LRG LVL3 (GOWN DISPOSABLE) ×6
LENS IOL ACRSF IQ TRC 4 20.0 IMPLANT
LENS IOL ACRYSOF IQ TORIC 20.0 ×3 IMPLANT
LENS IOL IQ TORIC 4 20.0 ×1 IMPLANT
MARKER SKIN DUAL TIP RULER LAB (MISCELLANEOUS) ×3 IMPLANT
NDL FILTER BLUNT 18X1 1/2 (NEEDLE) ×1 IMPLANT
NEEDLE FILTER BLUNT 18X 1/2SAF (NEEDLE) ×2
NEEDLE FILTER BLUNT 18X1 1/2 (NEEDLE) ×1 IMPLANT
PACK OPTHALMIC (MISCELLANEOUS) ×3 IMPLANT
PACK PORFILIO (MISCELLANEOUS) ×3 IMPLANT
SYR 3ML LL SCALE MARK (SYRINGE) ×3 IMPLANT
SYR TB 1ML LUER SLIP (SYRINGE) ×3 IMPLANT
WATER STERILE IRR 250ML POUR (IV SOLUTION) ×3 IMPLANT
WIPE NON LINTING 3.25X3.25 (MISCELLANEOUS) ×3 IMPLANT

## 2020-07-31 NOTE — H&P (Signed)
All labs reviewed. Abnormal studies sent to patients PCP when indicated.  Previous H&P reviewed, patient examined, there are NO CHANGES.  Abigail Compean Porfilio8/10/202110:41 AM

## 2020-07-31 NOTE — Transfer of Care (Signed)
Immediate Anesthesia Transfer of Care Note  Patient: Abigail Moody  Procedure(s) Performed: CATARACT EXTRACTION PHACO AND INTRAOCULAR LENS PLACEMENT (IOC) LEFT TORIC LENS 4.20   00:26.9 (Left Eye)  Patient Location: PACU  Anesthesia Type: MAC  Level of Consciousness: awake, alert  and patient cooperative  Airway and Oxygen Therapy: Patient Spontanous Breathing and Patient connected to supplemental oxygen  Post-op Assessment: Post-op Vital signs reviewed, Patient's Cardiovascular Status Stable, Respiratory Function Stable, Patent Airway and No signs of Nausea or vomiting  Post-op Vital Signs: Reviewed and stable  Complications: No complications documented.

## 2020-07-31 NOTE — Anesthesia Postprocedure Evaluation (Signed)
Anesthesia Post Note  Patient: Abigail Moody  Procedure(s) Performed: CATARACT EXTRACTION PHACO AND INTRAOCULAR LENS PLACEMENT (IOC) LEFT TORIC LENS 4.20   00:26.9 (Left Eye)     Patient location during evaluation: PACU Anesthesia Type: MAC Level of consciousness: awake and alert Pain management: pain level controlled Vital Signs Assessment: post-procedure vital signs reviewed and stable Respiratory status: spontaneous breathing, nonlabored ventilation, respiratory function stable and patient connected to nasal cannula oxygen Cardiovascular status: stable and blood pressure returned to baseline Postop Assessment: no apparent nausea or vomiting Anesthetic complications: no   No complications documented.  Adele Barthel Doc [place]ala

## 2020-07-31 NOTE — Anesthesia Preprocedure Evaluation (Signed)
Anesthesia Evaluation  Patient identified by MRN, date of birth, ID band Patient awake    History of Anesthesia Complications Negative for: history of anesthetic complications  Airway Mallampati: II  TM Distance: >3 FB Neck ROM: Full    Dental no notable dental hx.    Pulmonary neg pulmonary ROS,    Pulmonary exam normal        Cardiovascular hypertension, Normal cardiovascular exam     Neuro/Psych Seizures - (last one 1.5 years ago, controlled with Keppra), Well Controlled,     GI/Hepatic negative GI ROS, Neg liver ROS,   Endo/Other  negative endocrine ROS  Renal/GU negative Renal ROS     Musculoskeletal   Abdominal   Peds  Hematology negative hematology ROS (+)   Anesthesia Other Findings   Reproductive/Obstetrics                             Anesthesia Physical Anesthesia Plan  ASA: II  Anesthesia Plan: MAC   Post-op Pain Management:    Induction: Intravenous  PONV Risk Score and Plan: 2 and Midazolam and Treatment may vary due to age or medical condition  Airway Management Planned: Nasal Cannula and Natural Airway  Additional Equipment: None  Intra-op Plan:   Post-operative Plan:   Informed Consent: I have reviewed the patients History and Physical, chart, labs and discussed the procedure including the risks, benefits and alternatives for the proposed anesthesia with the patient or authorized representative who has indicated his/her understanding and acceptance.       Plan Discussed with: CRNA  Anesthesia Plan Comments:         Anesthesia Quick Evaluation

## 2020-07-31 NOTE — Op Note (Signed)
PREOPERATIVE DIAGNOSIS:  Nuclear sclerotic cataract of the left eye.   POSTOPERATIVE DIAGNOSIS:  Nuclear sclerotic cataract of the left eye.   OPERATIVE PROCEDURE: Procedure(s): CATARACT EXTRACTION PHACO AND INTRAOCULAR LENS PLACEMENT (IOC) LEFT TORIC LENS 4.20   00:26.9   SURGEON:  Birder Robson, MD.   ANESTHESIA: 1.      Managed anesthesia care. 2.     0.31ml os Shugarcaine was instilled following the paracentesis [email or web address]@   COMPLICATIONS:  None.   TECHNIQUE:   Stop and chop    DESCRIPTION OF PROCEDURE:  The patient was examined and consented in the preoperative holding area where the aforementioned topical anesthesia was applied to the left eye.  The patient was brought back to the Operating Room where he was sat upright on the gurney and given a target to fixate upon while the eye was marked at the 3:00 and 9:00 position.  The patient was then reclined on the operating table.  The eye was prepped and draped in the usual sterile ophthalmic fashion and a lid speculum was placed. A paracentesis was created with the side port blade and the anterior chamber was filled with viscoelastic. A near clear corneal incision was performed with the steel keratome. A continuous curvilinear capsulorrhexis was performed with a cystotome followed by the capsulorrhexis forceps. Hydrodissection and hydrodelineation were carried out with BSS on a blunt cannula. The lens was removed in a stop and chop technique and the remaining cortical material was removed with the irrigation-aspiration handpiece. The eye was inflated with viscoelastic and the Sn6At 4  lens was placed in the eye and rotated to within a few degrees of the predetermined orientation.  The remaining viscoelastic was removed from the eye.  The Sinskey hook was used to rotate the toric lens into its final resting place at 161 degrees.  0.1 ml of Vigamox was placed in the anterior chamber. The eye was inflated to a physiologic pressure and found to  be watertight.  The eye was dressed with Vigamox. The patient was given protective glasses to wear throughout the day and a shield with which to sleep tonight. The patient was also given drops with which to begin a drop regimen today and will follow-up with me in one day. Implant Name Type Inv. Item Serial No. Manufacturer Lot No. LRB No. Used Action  LENS IOL TORIC 20.0 - [id number]  LENS IOL TORIC 20.0 [id number] ALCON  Left 1 Implanted   Procedure(s): CATARACT EXTRACTION PHACO AND INTRAOCULAR LENS PLACEMENT (IOC) LEFT TORIC LENS 4.20   00:26.9 (Left)  Electronically signed: Birder Robson 8/10/202111:07 AM

## 2020-07-31 NOTE — Anesthesia Procedure Notes (Signed)
Procedure Name: MAC Date/Time: 07/31/2020 10:48 AM Performed by: Cameron Ali, CRNA Pre-anesthesia Checklist: Patient identified, Emergency Drugs available, Suction available, Timeout performed and Patient being monitored Patient Re-evaluated:Patient Re-evaluated prior to induction Oxygen Delivery Method: Nasal cannula Placement Confirmation: positive ETCO2

## 2020-10-31 DIAGNOSIS — G479 Sleep disorder, unspecified: Secondary | ICD-10-CM | POA: Diagnosis not present

## 2020-10-31 DIAGNOSIS — F411 Generalized anxiety disorder: Secondary | ICD-10-CM | POA: Diagnosis not present

## 2020-10-31 DIAGNOSIS — G40109 Localization-related (focal) (partial) symptomatic epilepsy and epileptic syndromes with simple partial seizures, not intractable, without status epilepticus: Secondary | ICD-10-CM | POA: Diagnosis not present

## 2020-10-31 DIAGNOSIS — R413 Other amnesia: Secondary | ICD-10-CM | POA: Diagnosis not present

## 2020-10-31 DIAGNOSIS — R4586 Emotional lability: Secondary | ICD-10-CM | POA: Diagnosis not present

## 2021-02-27 DIAGNOSIS — H04223 Epiphora due to insufficient drainage, bilateral lacrimal glands: Secondary | ICD-10-CM | POA: Diagnosis not present

## 2021-07-08 ENCOUNTER — Other Ambulatory Visit: Payer: Self-pay | Admitting: Internal Medicine

## 2021-07-08 DIAGNOSIS — Z1231 Encounter for screening mammogram for malignant neoplasm of breast: Secondary | ICD-10-CM

## 2021-07-23 ENCOUNTER — Ambulatory Visit
Admission: RE | Admit: 2021-07-23 | Discharge: 2021-07-23 | Disposition: A | Discharge status: Home / Self Care | Payer: Medicare HMO | Source: Ambulatory Visit | Attending: Internal Medicine | Admitting: Internal Medicine

## 2021-07-23 ENCOUNTER — Other Ambulatory Visit: Payer: Self-pay

## 2021-07-23 DIAGNOSIS — Z1231 Encounter for screening mammogram for malignant neoplasm of breast: Secondary | ICD-10-CM | POA: Diagnosis not present

## 2021-09-03 DIAGNOSIS — G40109 Localization-related (focal) (partial) symptomatic epilepsy and epileptic syndromes with simple partial seizures, not intractable, without status epilepticus: Secondary | ICD-10-CM | POA: Diagnosis not present

## 2021-09-03 DIAGNOSIS — I779 Disorder of arteries and arterioles, unspecified: Secondary | ICD-10-CM | POA: Diagnosis not present

## 2021-09-03 DIAGNOSIS — E785 Hyperlipidemia, unspecified: Secondary | ICD-10-CM | POA: Diagnosis not present

## 2021-09-03 DIAGNOSIS — E7849 Other hyperlipidemia: Secondary | ICD-10-CM | POA: Diagnosis not present

## 2021-09-03 DIAGNOSIS — Z0000 Encounter for general adult medical examination without abnormal findings: Secondary | ICD-10-CM | POA: Diagnosis not present

## 2021-09-03 DIAGNOSIS — I10 Essential (primary) hypertension: Secondary | ICD-10-CM | POA: Diagnosis not present

## 2021-09-03 DIAGNOSIS — Z1389 Encounter for screening for other disorder: Secondary | ICD-10-CM | POA: Diagnosis not present

## 2021-09-03 DIAGNOSIS — N183 Chronic kidney disease, stage 3 unspecified: Secondary | ICD-10-CM | POA: Diagnosis not present

## 2021-09-03 DIAGNOSIS — E559 Vitamin D deficiency, unspecified: Secondary | ICD-10-CM | POA: Diagnosis not present

## 2021-09-03 DIAGNOSIS — G3184 Mild cognitive impairment, so stated: Secondary | ICD-10-CM | POA: Diagnosis not present

## 2021-09-03 DIAGNOSIS — I129 Hypertensive chronic kidney disease with stage 1 through stage 4 chronic kidney disease, or unspecified chronic kidney disease: Secondary | ICD-10-CM | POA: Diagnosis not present

## 2021-09-03 DIAGNOSIS — F3342 Major depressive disorder, recurrent, in full remission: Secondary | ICD-10-CM | POA: Diagnosis not present

## 2021-09-03 DIAGNOSIS — R3129 Other microscopic hematuria: Secondary | ICD-10-CM | POA: Diagnosis not present

## 2021-11-12 DIAGNOSIS — R399 Unspecified symptoms and signs involving the genitourinary system: Secondary | ICD-10-CM | POA: Diagnosis not present

## 2021-12-24 DIAGNOSIS — H903 Sensorineural hearing loss, bilateral: Secondary | ICD-10-CM | POA: Diagnosis not present

## 2021-12-24 DIAGNOSIS — H6123 Impacted cerumen, bilateral: Secondary | ICD-10-CM | POA: Diagnosis not present

## 2022-01-08 ENCOUNTER — Ambulatory Visit (INDEPENDENT_AMBULATORY_CARE_PROVIDER_SITE_OTHER): Payer: No Typology Code available for payment source | Admitting: Dermatology

## 2022-01-08 ENCOUNTER — Other Ambulatory Visit: Payer: Self-pay

## 2022-01-08 DIAGNOSIS — D1800 Hemangioma unspecified site: Secondary | ICD-10-CM

## 2022-01-08 DIAGNOSIS — L820 Inflamed seborrheic keratosis: Secondary | ICD-10-CM

## 2022-01-08 DIAGNOSIS — Z1283 Encounter for screening for malignant neoplasm of skin: Secondary | ICD-10-CM

## 2022-01-08 DIAGNOSIS — L659 Nonscarring hair loss, unspecified: Secondary | ICD-10-CM

## 2022-01-08 DIAGNOSIS — L578 Other skin changes due to chronic exposure to nonionizing radiation: Secondary | ICD-10-CM | POA: Diagnosis not present

## 2022-01-08 DIAGNOSIS — L648 Other androgenic alopecia: Secondary | ICD-10-CM | POA: Diagnosis not present

## 2022-01-08 DIAGNOSIS — L814 Other melanin hyperpigmentation: Secondary | ICD-10-CM

## 2022-01-08 DIAGNOSIS — L821 Other seborrheic keratosis: Secondary | ICD-10-CM

## 2022-01-08 DIAGNOSIS — D229 Melanocytic nevi, unspecified: Secondary | ICD-10-CM | POA: Diagnosis not present

## 2022-01-08 NOTE — Patient Instructions (Signed)

## 2022-01-08 NOTE — Progress Notes (Signed)
New Patient Visit  Subjective  Abigail Moody is a 76 y.o. female who presents for the following: Other (New patient - here for TBSE today). She complains of irritating growths on her trunk that have been rough and scaly.  She would like treatment if possible.  Referred by Dr. Caryl Comes  The patient presents for Total-Body Skin Exam (TBSE) for skin cancer screening and mole check.  The patient has spots, moles and lesions to be evaluated, some may be new or changing and the patient has concerns that these could be cancer. She has had hair loss with thinning of the frontal and vertex area of the scalp.  She wonders about treatment options.  The following portions of the chart were reviewed this encounter and updated as appropriate:   Tobacco   Allergies   Meds   Problems   Med Hx   Surg Hx   Fam Hx       Review of Systems:  No other skin or systemic complaints except as noted in HPI or Assessment and Plan.  Objective  Well appearing patient in no apparent distress; mood and affect are within normal limits.  A full examination was performed including scalp, head, eyes, ears, nose, lips, neck, chest, axillae, abdomen, back, buttocks, bilateral upper extremities, bilateral lower extremities, hands, feet, fingers, toes, fingernails, and toenails. All findings within normal limits unless otherwise noted below.  Scalp Diffuse thinning of the crown and widening of the midline part with retention of the frontal hairline - Reviewed progressive nature and prognosis.        Right back x 1, left side x 1 (2) Erythematous stuck-on, waxy papule or plaque         Assessment & Plan   Lentigines - Scattered tan macules - Due to sun exposure - Benign-appearing, observe - Recommend daily broad spectrum sunscreen SPF 30+ to sun-exposed areas, reapply every 2 hours as needed. - Call for any changes  Seborrheic Keratoses - Stuck-on, waxy, tan-brown papules and/or plaques  -  Benign-appearing - Discussed benign etiology and prognosis. - Observe - Call for any changes  Melanocytic Nevi - Tan-brown and/or pink-flesh-colored symmetric macules and papules - Benign appearing on exam today - Observation - Call clinic for new or changing moles - Recommend daily use of broad spectrum spf 30+ sunscreen to sun-exposed areas.   Hemangiomas - Red papules - Discussed benign nature - Observe - Call for any changes  Actinic Damage - Chronic condition, secondary to cumulative UV/sun exposure - diffuse scaly erythematous macules with underlying dyspigmentation - Recommend daily broad spectrum sunscreen SPF 30+ to sun-exposed areas, reapply every 2 hours as needed.  - Staying in the shade or wearing long sleeves, sun glasses (UVA+UVB protection) and wide brim hats (4-inch brim around the entire circumference of the hat) are also recommended for sun protection.  - Call for new or changing lesions.  Skin cancer screening performed today.  Alopecia Scalp Female pattern androgenetic Alopecia Chronic and persistent.   Continue Hair, Skin, Nails vitamin daily.  Active ingredient is biotin with this vitamin.  Discussed low-dose oral minoxidil.  Minoxidil 1.25 mg daily dosing orally is of very safe, new well studied treatment for alopecia of any type, especially female androgenetic alopecia.  We will send a note to Dr. Caryl Comes to verify that it is okay to start Minoxidil 1.25 mg 1 p.o. daily.  It does not appear patient has any contraindication to treatment at this time with this medication.  Contraindication  would include congestive heart failure or any other significant heart disease.  Inflamed seborrheic keratosis (2) Right back x 1, left side x 1 Patient advised that these areas are very thick and will likely need additional treatment.  We will schedule for 3 months for retreatment. Destruction of lesion - Right back x 1, left side x 1 Complexity: simple   Destruction  method: cryotherapy   Informed consent: discussed and consent obtained   Timeout:  patient name, date of birth, surgical site, and procedure verified Lesion destroyed using liquid nitrogen: Yes   Region frozen until ice ball extended beyond lesion: Yes   Outcome: patient tolerated procedure well with no complications   Post-procedure details: wound care instructions given    Return in about 3 months (around 04/08/2022) for ISK follow up.  I, Ashok Cordia, CMA, am acting as scribe for Sarina Ser, MD . Documentation: I have reviewed the above documentation for accuracy and completeness, and I agree with the above.  Sarina Ser, MD

## 2022-01-09 ENCOUNTER — Encounter: Payer: Self-pay | Admitting: Dermatology

## 2022-02-04 DIAGNOSIS — M8588 Other specified disorders of bone density and structure, other site: Secondary | ICD-10-CM | POA: Diagnosis not present

## 2022-02-25 DIAGNOSIS — I10 Essential (primary) hypertension: Secondary | ICD-10-CM | POA: Diagnosis not present

## 2022-02-25 DIAGNOSIS — N183 Chronic kidney disease, stage 3 unspecified: Secondary | ICD-10-CM | POA: Diagnosis not present

## 2022-02-25 DIAGNOSIS — E7849 Other hyperlipidemia: Secondary | ICD-10-CM | POA: Diagnosis not present

## 2022-03-04 DIAGNOSIS — E559 Vitamin D deficiency, unspecified: Secondary | ICD-10-CM | POA: Diagnosis not present

## 2022-03-04 DIAGNOSIS — R7989 Other specified abnormal findings of blood chemistry: Secondary | ICD-10-CM | POA: Diagnosis not present

## 2022-03-04 DIAGNOSIS — I10 Essential (primary) hypertension: Secondary | ICD-10-CM | POA: Diagnosis not present

## 2022-03-04 DIAGNOSIS — R3129 Other microscopic hematuria: Secondary | ICD-10-CM | POA: Diagnosis not present

## 2022-03-04 DIAGNOSIS — E7849 Other hyperlipidemia: Secondary | ICD-10-CM | POA: Diagnosis not present

## 2022-03-04 DIAGNOSIS — G3184 Mild cognitive impairment, so stated: Secondary | ICD-10-CM | POA: Diagnosis not present

## 2022-03-04 DIAGNOSIS — N183 Chronic kidney disease, stage 3 unspecified: Secondary | ICD-10-CM | POA: Diagnosis not present

## 2022-03-04 DIAGNOSIS — F3342 Major depressive disorder, recurrent, in full remission: Secondary | ICD-10-CM | POA: Diagnosis not present

## 2022-03-04 DIAGNOSIS — I779 Disorder of arteries and arterioles, unspecified: Secondary | ICD-10-CM | POA: Diagnosis not present

## 2022-03-04 DIAGNOSIS — R002 Palpitations: Secondary | ICD-10-CM | POA: Diagnosis not present

## 2022-03-04 DIAGNOSIS — R569 Unspecified convulsions: Secondary | ICD-10-CM | POA: Diagnosis not present

## 2022-04-10 ENCOUNTER — Ambulatory Visit (INDEPENDENT_AMBULATORY_CARE_PROVIDER_SITE_OTHER): Payer: No Typology Code available for payment source | Admitting: Dermatology

## 2022-04-10 DIAGNOSIS — L821 Other seborrheic keratosis: Secondary | ICD-10-CM

## 2022-04-10 DIAGNOSIS — L820 Inflamed seborrheic keratosis: Secondary | ICD-10-CM | POA: Diagnosis not present

## 2022-04-10 DIAGNOSIS — L659 Nonscarring hair loss, unspecified: Secondary | ICD-10-CM

## 2022-04-10 DIAGNOSIS — L649 Androgenic alopecia, unspecified: Secondary | ICD-10-CM

## 2022-04-10 MED ORDER — MINOXIDIL 2.5 MG PO TABS: ORAL_TABLET | ORAL | 2 refills | Status: DC

## 2022-04-10 NOTE — Patient Instructions (Addendum)
?Discussed low-dose oral minoxidil.  Minoxidil 1.25 mg daily dosing orally is of very safe, new well studied treatment for alopecia of any type, especially female androgenetic alopecia.  We will send a note to Dr. Caryl Comes to verify that it is okay to start Minoxidil 1.25 mg 1 p.o. daily.  It does not appear patient has any contraindication to treatment at this time with this medication.  Contraindication would include congestive heart failure or any other significant heart disease. ? ? ? ?Cryotherapy Aftercare ? ?Wash gently with soap and water everyday.   ?Apply Vaseline and Band-Aid daily until healed.  ? ? ?If You Need Anything After Your Visit ? ?If you have any questions or concerns for your doctor, please call our main line at [phone] and press option 4 to reach your doctor's medical assistant. If no one answers, please leave a voicemail as directed and we will return your call as soon as possible. Messages left after 4 pm will be answered the following business day.  ? ?You may also send [place] a message via MyChart. We typically respond to MyChart messages within 1-2 business days. ? ?For prescription refills, please ask your pharmacy to contact our office. Our fax number is [phone]. ? ?If you have an urgent issue when the clinic is closed that cannot wait until the next business day, you can page your doctor at the number below.   ? ?Please note that while we do our best to be available for urgent issues outside of office hours, we are not available 24/7.  ? ?If you have an urgent issue and are unable to reach [place], you may choose to seek medical care at your doctor's office, retail clinic, urgent care center, or emergency room. ? ?If you have a medical emergency, please immediately call 911 or go to the emergency department. ? ?Pager Numbers ? ?- Dr. Nehemiah Massed: [phone] ? ?- Dr. Laurence Ferrari: [phone] ? ?- Dr. Nicole Kindred: [phone] ? ?In the event of inclement weather, please call our main line at  [phone] for an update on the status of any delays or closures. ? ?Dermatology Medication Tips: ?Please keep the boxes that topical medications come in in order to help keep track of the instructions about where and how to use these. Pharmacies typically print the medication instructions only on the boxes and not directly on the medication tubes.  ? ?If your medication is too expensive, please contact our office at [phone] option 4 or send [place] a message through [employer].  ? ?We are unable to tell what your co-pay for medications will be in advance as this is different depending on your insurance coverage. However, we may be able to find a substitute medication at lower cost or fill out paperwork to get insurance to cover a needed medication.  ? ?If a prior authorization is required to get your medication covered by your insurance company, please allow [place] 1-2 business days to complete this process. ? ?Drug prices often vary depending on where the prescription is filled and some pharmacies may offer cheaper prices. ? ?The website www.goodrx.com contains coupons for medications through different pharmacies. The prices here do not account for what the cost may be with help from insurance (it may be cheaper with your insurance), but the website can give you the price if you did not use any insurance.  ?- You can print the associated coupon and take it with your prescription to the pharmacy.  ?- You may also stop by our office  during regular business hours and pick up a GoodRx coupon card.  ?- If you need your prescription sent electronically to a different pharmacy, notify our office through [facility] or by phone at [phone] option 4. ? ? ? ? ?Si Usted Necesita Algo Despu?s de Su Visita ? ?Tambi?n puede enviarnos un mensaje a trav?s de MyChart. Por lo general respondemos a los mensajes de MyChart en el transcurso de 1 a 2 d?as h?biles. ? ?Para renovar recetas, por favor pida a su farmacia que se  ponga en contacto con nuestra oficina. Nuestro n?mero de fax es el [phone]. ? ?Si tiene un asunto urgente cuando la cl?nica est? cerrada y que no puede esperar hasta el siguiente d?a h?bil, puede llamar/localizar a su doctor(a) al n?mero que aparece a continuaci?n.  ? ?Por favor, tenga en cuenta que aunque hacemos todo lo posible para estar disponibles para asuntos urgentes fuera del horario de oficina, no estamos disponibles las 24 horas del d?a, los 7 d?as de la semana.  ? ?Si tiene un problema urgente y no puede comunicarse con nosotros, puede optar por buscar atenci?n m?dica  en el consultorio de su doctor(a), en una cl?nica privada, en un centro de atenci?n urgente o en una sala de emergencias. ? ?Si tiene Engineer, maintenance (IT) m?dica, por favor llame inmediatamente al 911 o vaya a la sala de emergencias. ? ?N?meros de b?per ? ?- Dr. Nehemiah Massed: [phone] ? ?- Dra. Moye: [phone] ? ?- Dra. Nicole Kindred: [phone] ? ?En caso de inclemencias del tiempo, por favor llame a nuestra l?nea principal al [phone] para una actualizaci?n sobre el estado de cualquier retraso o cierre. ? ?Consejos para la medicaci?n en dermatolog?a: ?Por favor, guarde las cajas en las que vienen los medicamentos de uso t?pico para ayudarle a seguir las instrucciones sobre d?nde y c?mo usarlos. Las farmacias generalmente imprimen las instrucciones del medicamento s?lo en las cajas y no directamente en los tubos del [place].  ? ?Si su medicamento es muy caro, por favor, p?ngase en contacto con Zigmund Daniel llamando al [phone] y presione la opci?n 4 o env?enos un mensaje a trav?s de MyChart.  ? ?No podemos decirle cu?l ser? su copago por los medicamentos por adelantado ya que esto es diferente dependiendo de la cobertura de su seguro. Sin embargo, es posible que podamos encontrar un medicamento sustituto a Electrical engineer un formulario para que el seguro cubra el medicamento que se considera necesario.  ? ?Si se requiere  Ardelia Mems autorizaci?n previa para que su compa??a de seguros [place] su medicamento, por favor perm?tanos de 1 a 2 d?as h?biles para completar este proceso. ? ?Los precios de los medicamentos var?an con frecuencia dependiendo del Environmental consultant de d?nde se surte la receta y alguna farmacias pueden ofrecer precios m?s baratos. ? ?El sitio web www.goodrx.com tiene cupones para medicamentos de Airline pilot. Los precios aqu? no tienen en cuenta lo que podr?a costar con la ayuda del seguro (puede ser m?s barato con su seguro), pero el sitio web puede darle el precio si no utiliz? ning?n seguro.  ?- Puede imprimir el cup?n correspondiente y llevarlo con su receta a la farmacia.  ?- Tambi?n puede pasar por nuestra oficina durante el horario de atenci?n regular y recoger una tarjeta de cupones de GoodRx.  ?- Si necesita que su receta se env?e electr?nicamente a Chiropodist, informe a nuestra oficina a trav?s de MyChart de [place] o por tel?fono llamando al [phone] y presione la opci?n 4.  ?

## 2022-04-10 NOTE — Progress Notes (Signed)
? ?Follow-Up Visit ?  ?Subjective  ?Abigail Moody is a 76 y.o. female who presents for the following: Follow-up (3 months f/u ISK on the right back and left side area treated with LN2 ). ?F/u on Alopecia on the scalp treating with Minoxidil 1.25 mg daily and otc hair, skin nails vitamins with a fair response. ?The patient has spots, moles and lesions to be evaluated, some may be new or changing and the patient has concerns that these could be cancer. ? ?The following portions of the chart were reviewed this encounter and updated as appropriate:  ? Tobacco  Allergies  Meds  Problems  Med Hx  Surg Hx  Fam Hx   ?  ?Review of Systems:  No other skin or systemic complaints except as noted in HPI or Assessment and Plan. ? ?Objective  ?Well appearing patient in no apparent distress; mood and affect are within normal limits. ? ?A focused examination was performed including face,back,flank. Relevant physical exam findings are noted in the Assessment and Plan. ? ?Right Flank x 1, braline x 4, intermamammary x 12 (17) (17) ?Stuck-on, waxy, tan-brown papules ? ?Scalp ?Diffuse thinning of the crown and widening of the midline part with retention of the frontal hairline - Reviewed progressive nature and prognosis.  ? ? ?Assessment & Plan  ?Inflamed seborrheic keratosis (17) ?Right Flank x 1, braline x 4, intermamammary x 12 (17) ? ?Reassured benign age-related growth.  Recommend observation.  Discussed cryotherapy if spot(s) become irritated or inflamed.  ? ?Destruction of lesion - Right Flank x 1, braline x 4, intermamammary x 12 (17) ?Complexity: simple   ?Destruction method: cryotherapy   ?Informed consent: discussed and consent obtained   ?Timeout:  patient name, date of birth, surgical site, and procedure verified ?Lesion destroyed using liquid nitrogen: Yes   ?Region frozen until ice ball extended beyond lesion: Yes   ?Outcome: patient tolerated procedure well with no complications   ?Post-procedure details:  wound care instructions given   ? ?Alopecia ?Scalp ?Female pattern androgenetic Alopecia ?Chronic and persistent.   ?Continue Hair, Skin, Nails vitamin daily.  Active ingredient is biotin with this vitamin. ?  ?BP 132/73 ?Wt. 203 ? ?Discussed low-dose oral minoxidil.   ?Start Minoxidil 1.25 mg daily #30  2 RF ? ? new well studied treatment for alopecia of any type, especially female androgenetic alopecia.  We will send a note to Dr. Caryl Comes to verify that it is okay to start Minoxidil 1.25 mg 1 p.o. daily.  It does not appear patient has any contraindication to treatment at this time with this medication.  Contraindication would include congestive heart failure or any other significant heart disease. ? ?Female Androgenic Alopecia is a chronic condition related to genetics and/or hormonal changes.  In women androgenetic alopecia is commonly associated with menopause but may occur any time after puberty.  It causes hair thinning primarily on the crown with widening of the part and temporal hairline recession.  Can use OTC Rogaine (minoxidil) 5% solution/foam as directed.  ?Oral treatments in female patients who have no contraindication may include : ?- Low dose oral minoxidil 1.25 - '5mg'$  daily ?- Spironolactone 50 - '100mg'$  bid ?- Finasteride 2.5 - 5 mg daily ?Adjunctive therapies include: ?- Low Level Laser Light Therapy (LLLT) ?- Platelet-rich plasma injections (PRP) ?- Hair Transplants or scalp reduction ? ?Related Medications ?minoxidil (LONITEN) 2.5 MG tablet ?Take 1/2 tablet a day to help increase hair ? ?Seborrheic Keratoses ?- Stuck-on, waxy, tan-brown papules and/or plaques  ?-  Benign-appearing ?- Discussed benign etiology and prognosis. ?- Observe ?- Call for any changes ? ?Return in about 6 months (around 10/10/2022) for ISK, Alopecia . ? ?I, Abigail Moody, CMA, am acting as scribe for Abigail Ser, MD .  ?Documentation: I have reviewed the above documentation for accuracy and completeness, and I agree with  the above. ? ?Abigail Ser, MD ? ?

## 2022-04-17 ENCOUNTER — Encounter: Payer: Self-pay | Admitting: Internal Medicine

## 2022-04-17 ENCOUNTER — Ambulatory Visit (INDEPENDENT_AMBULATORY_CARE_PROVIDER_SITE_OTHER): Payer: No Typology Code available for payment source | Admitting: Internal Medicine

## 2022-04-17 VITALS — BP 120/90 | HR 66 | Ht 63.0 in | Wt 205.0 lb

## 2022-04-17 DIAGNOSIS — I493 Ventricular premature depolarization: Secondary | ICD-10-CM | POA: Diagnosis not present

## 2022-04-17 DIAGNOSIS — I10 Essential (primary) hypertension: Secondary | ICD-10-CM | POA: Diagnosis not present

## 2022-04-17 DIAGNOSIS — I6522 Occlusion and stenosis of left carotid artery: Secondary | ICD-10-CM | POA: Diagnosis not present

## 2022-04-17 DIAGNOSIS — I38 Endocarditis, valve unspecified: Secondary | ICD-10-CM

## 2022-04-17 DIAGNOSIS — R002 Palpitations: Secondary | ICD-10-CM

## 2022-04-17 DIAGNOSIS — I340 Nonrheumatic mitral (valve) insufficiency: Secondary | ICD-10-CM

## 2022-04-17 NOTE — Patient Instructions (Signed)
Medication Instructions:   Your physician recommends that you continue on your current medications as directed. Please refer to the Current Medication list given to you today.  *If you need a refill on your cardiac medications before your next appointment, please call your pharmacy*   Lab Work:  None ordered  Testing/Procedures:  Your physician has requested that you have an echocardiogram. Echocardiography is a painless test that uses sound waves to create images of your heart. It provides your doctor with information about the size and shape of your heart and how well your heart's chambers and valves are working. This procedure takes approximately one hour. There are no restrictions for this procedure.   Follow-Up: At [facility], you and your health needs are our priority.  As part of our continuing mission to provide you with exceptional heart care, we have created designated Provider Care Teams.  These Care Teams include your primary Cardiologist (physician) and Advanced Practice Providers (APPs -  Physician Assistants and Nurse Practitioners) who all work together to provide you with the care you need, when you need it.  We recommend signing up for the patient portal called "MyChart".  Sign up information is provided on this After Visit Summary.  MyChart is used to connect with patients for Virtual Visits (Telemedicine).  Patients are able to view lab/test results, encounter notes, upcoming appointments, etc.  Non-urgent messages can be sent to your provider as well.   To learn more about what you can do with MyChart, go to [email or web address].com.    Your next appointment:   1 month(s)  The format for your next appointment:   In Person  Provider:   You may see Dr. Christopher End or one of the following Advanced Practice Providers on your designated Care Team:   Christopher Berge, NP Ryan Dunn, PA-C Cadence Furth, PA-C{   Important Information About Sugar       

## 2022-04-17 NOTE — Progress Notes (Signed)
? ?New Outpatient Visit ?Date: 04/17/2022 ? ?Referring Provider: ?Adin Hector, MD ?[facility] ?[place],  [place] [address] ? ?Chief Complaint: Palpitations ? ?HPI:  Abigail Moody is a 76 y.o. female who is being seen today for the evaluation of palpitations at the request of Dr. Caryl Comes. She has a history of carotid artery stenosis, hypertension, hyperlipidemia, obstructive sleep apnea, reactive airway disease, chronic kidney disease, seizure disorder with mild cognitive impairment, arthritis, acid reflux, and anxiety/depression.  She reached out to Dr. Olin Pia office 2 weeks ago complaining of palpitations.  Holter monitor reportedly showed occasional ectopy/ventricular triplets though some rhythm strips were unclear.  Slow atrial fibrillation could not be excluded. ? ?Today, Abigail Moody reports that she feels fairly well.  She recounts episodic vibratory sensation in her left upper chest/shoulder over the last few months that prompted the recent Holter monitor by Dr. Caryl Comes.  She has also had sporadic pain in the left chest that happens randomly and is not associated with particular movements or exertion.  She wonders if it may be due to a rotator cuff injury.  She has not had any frank chest pain.  She has been dealing with some sinus congestion due to seasonal allergies but otherwise does not feel short of breath.  She denies lightheadedness/dizziness.  She reports a history of dependent edema, which has been fairly minimal and stable. ? ?-------------------------------------------------------------------------------------------------- ? ?Cardiovascular History & Procedures: ?Cardiovascular Problems: ?Palpitations ? ?Risk Factors: ?Carotid artery stenosis, hypertension, hyperlipidemia, obesity, and age greater than 34 ? ?Cath/PCI: ?None ? ?CV Surgery: ?None ? ?EP Procedures and Devices: ?Recent Holter monitor reportedly showing occasional ventricular ectopy.  Slow atrial  fibrillation could not be excluded.  Tracings not available for review. ? ?Non-Invasive Evaluation(s): ?TTE (04/20/2017, [facility]): Normal LV size with mild LVH.  LVEF greater than 55%.  Mild mitral and tricuspid regurgitation.  Trivial aortic regurgitation.  Normal RV size and function. ? ?Recent CV Pertinent Labs: ?Lab Results  ?Component Value Date  ? CHOL 144 08/07/2016  ? HDL 45 08/07/2016  ? [place] 70 08/07/2016  ? TRIG 146 08/07/2016  ? CHOLHDL 3.2 08/07/2016  ? INR 0.92 08/06/2016  ? K 3.1 (L) 08/06/2016  ? K 4.0 01/27/2012  ? BUN 18 08/06/2016  ? BUN 15 01/27/2012  ? CREATININE 0.97 08/06/2016  ? CREATININE 0.90 01/27/2012  ? ? ?-------------------------------------------------------------------------------------------------- ? ?Past Medical History:  ?Diagnosis Date  ? Depression   ? Diffuse cystic mastopathy   ? GERD (gastroesophageal reflux disease)   ? Headache   ? migraines in distant past  ? Hemorrhoid   ? Hyperlipidemia   ? Hypertension   ? Seizures ([place])   ? none for over 1 yr (since on Keppra)  ? ? ?Past Surgical History:  ?Procedure Laterality Date  ? ANKLE SURGERY Right 2006  ? CARPAL TUNNEL RELEASE Bilateral   ? CATARACT EXTRACTION W/PHACO Right 06/12/2020  ? Procedure: CATARACT EXTRACTION PHACO AND INTRAOCULAR LENS PLACEMENT ([place]) RIGHT TORIC LENS 5.27 00:34.5 ;  Surgeon: Birder Robson, MD;  Location: [place];  Service: Ophthalmology;  Laterality: Right;  Latex  ? CATARACT EXTRACTION W/PHACO Left 07/31/2020  ? Procedure: CATARACT EXTRACTION PHACO AND INTRAOCULAR LENS PLACEMENT ([facility]) LEFT TORIC LENS 4.20   00:26.9;  Surgeon: Birder Robson, MD;  Location: [place];  Service: Ophthalmology;  Laterality: Left;  ? COLONOSCOPY  2008  ? Dr. Epifanio Lesches  ? COLONOSCOPY WITH PROPOFOL N/A 03/31/2018  ? Procedure: COLONOSCOPY WITH PROPOFOL;  Surgeon: Manya Silvas, MD;  Location: [facility] ENDOSCOPY;  Service: Endoscopy;  Laterality: N/A;  ? MOUTH SURGERY  2010  ? TOE SURGERY  Left 2006  ? TONSILLECTOMY  1959  ? TRIGGER FINGER RELEASE Right 2013-2014?  ? ? ?Current Meds  ?Medication Sig  ? amLODipine (NORVASC) 5 MG tablet Take 5 mg by mouth daily.  ? aspirin 81 MG EC tablet Take 81 mg by mouth daily.  ? atorvastatin (LIPITOR) 10 MG tablet Take 10 mg by mouth daily.  ? Biotin 10 MG TABS Take 1 tablet by mouth daily.  ? Cholecalciferol 50 MCG (2000 UT) TABS Take by mouth daily.  ? cyanocobalamin 1000 MCG tablet Take 1,000 mcg by mouth daily.  ? escitalopram (LEXAPRO) 10 MG tablet Take 10 mg by mouth daily.  ? ferrous sulfate 325 (65 FE) MG tablet Take 325 mg by mouth daily with breakfast.   ? fexofenadine (ALLEGRA) 180 MG tablet Take 180 mg by mouth daily.  ? fluticasone (FLONASE) 50 MCG/ACT nasal spray as needed.  ? levETIRAcetam (KEPPRA) 500 MG tablet Take 500 mg by mouth 2 (two) times daily.  ? melatonin 5 MG TABS Take 5 mg by mouth at bedtime as needed.  ? minoxidil (LONITEN) 2.5 MG tablet Take 1/2 tablet a day to help increase hair  ? Multiple Vitamins-Minerals (MULTIVITAMIN WITH MINERALS) tablet Take 1 tablet by mouth daily.  ? omeprazole (PRILOSEC) 20 MG capsule Take 20 mg by mouth daily.  ? PROAIR HFA 108 (90 BASE) MCG/ACT inhaler as needed.  ? ? ?Allergies: Erythromycin, Latex, Levaquin [levofloxacin], and Nickel ? ?Social History  ? ?Tobacco Use  ? Smoking status: Never  ? Smokeless tobacco: Never  ?Vaping Use  ? Vaping Use: Never used  ?Substance Use Topics  ? Alcohol use: Not Currently  ? Drug use: No  ? ? ?Family History  ?Problem Relation Age of Onset  ? Heart attack Mother   ? Cancer Father   ?     prostate  ? Heart attack Sister 60  ? Cancer Brother   ?     half brother/lung  ? Breast cancer Other   ? ? ?Review of Systems: ?A 12-system review of systems was performed and was negative except as noted in the HPI. ? ?-------------------------------------------------------------------------------------------------- ? ?Physical Exam: ?BP 120/90 (BP Location: Right Arm, Patient  Position: Sitting, Cuff Size: Large)   Pulse 66   Ht '5\' 3"'$  (1.6 m)   Wt 205 lb (93 kg)   SpO2 96%   BMI 36.31 kg/m?  ? ?General: NAD. ?HEENT: No conjunctival pallor or scleral icterus. Facemask in place. ?Neck: Supple without lymphadenopathy, thyromegaly, JVD, or HJR. No carotid bruit. ?Lungs: Normal work of breathing. Clear to auscultation bilaterally without wheezes or crackles. ?Heart: Regular rate and rhythm without murmurs, rubs, or gallops. Non-displaced PMI. ?Abd: Bowel sounds present. Soft, NT/ND without hepatosplenomegaly ?Ext: No lower extremity edema. Radial, PT, and DP pulses are 2+ bilaterally ?Skin: Warm and dry without rash. ?Neuro: CNIII-XII intact. Strength and fine-touch sensation intact in upper and lower extremities bilaterally. ?Psych: Normal mood and affect. ? ?EKG: Normal sinus rhythm with poor R wave progression.  No significant change from prior tracing on 08/06/2016. ? ?Lab Results  ?Component Value Date  ? WBC 11.3 (H) 08/06/2016  ? HGB 14.1 08/06/2016  ? HCT 42.8 08/06/2016  ? MCV 93.5 08/06/2016  ? PLT 307 08/06/2016  ? ? ?Lab Results  ?Component Value Date  ? NA 142 08/06/2016  ? K 3.1 (  L) 08/06/2016  ? CL 108 08/06/2016  ? CO2 25 08/06/2016  ? BUN 18 08/06/2016  ? CREATININE 0.97 08/06/2016  ? GLUCOSE 95 08/06/2016  ? ALT 17 08/06/2016  ? ? ?Lab Results  ?Component Value Date  ? CHOL 144 08/07/2016  ? HDL 45 08/07/2016  ? [place] 70 08/07/2016  ? TRIG 146 08/07/2016  ? CHOLHDL 3.2 08/07/2016  ? ? ? ?-------------------------------------------------------------------------------------------------- ? ?ASSESSMENT AND PLAN: ?Palpitations and premature ventricular contractions: ?Symptoms have been sporadically present over the last few months.  Recent Holter monitor by Ms. Tocci PCP reportedly showed some ventricular ectopy.  There was also mention of possible slow atrial fibrillation.  Unfortunately, rhythm strips have not been provided to [place] for review.  We will reach out to Dr.  Olin Pia office to see if event monitor strips can be forwarded to our office.  We will defer medication changes for now but have agreed to obtain an echocardiogram given aforementioned symptoms as well as history of mild

## 2022-04-18 ENCOUNTER — Encounter: Payer: Self-pay | Admitting: Internal Medicine

## 2022-04-18 DIAGNOSIS — R002 Palpitations: Secondary | ICD-10-CM | POA: Insufficient documentation

## 2022-04-18 DIAGNOSIS — I10 Essential (primary) hypertension: Secondary | ICD-10-CM | POA: Insufficient documentation

## 2022-04-18 DIAGNOSIS — I38 Endocarditis, valve unspecified: Secondary | ICD-10-CM | POA: Insufficient documentation

## 2022-04-18 DIAGNOSIS — I493 Ventricular premature depolarization: Secondary | ICD-10-CM | POA: Insufficient documentation

## 2022-04-19 ENCOUNTER — Encounter: Payer: Self-pay | Admitting: Dermatology

## 2022-04-29 ENCOUNTER — Telehealth: Payer: Self-pay | Admitting: Internal Medicine

## 2022-04-29 NOTE — Telephone Encounter (Signed)
Please let [name] know that I have reviewed her event monitor ordered by Dr. Ramonita Lab.  It shows some extra beats (PAC's, PVC's, and brief NSVT and PSVT).  I do not see clear evidence of atrial fibrillation.  I recommend that she continue her current medications.  We will follow-up with her after completion of echo scheduled for 5/18. ? ?Nelva Bush, MD ?[facility] ? ?

## 2022-04-29 NOTE — Telephone Encounter (Signed)
The patient has been notified of the result and verbalized understanding.  All questions (if any) were answered. ?Pt will follow up as scheduled.  ?

## 2022-05-08 ENCOUNTER — Ambulatory Visit (INDEPENDENT_AMBULATORY_CARE_PROVIDER_SITE_OTHER): Payer: No Typology Code available for payment source

## 2022-05-08 DIAGNOSIS — I493 Ventricular premature depolarization: Secondary | ICD-10-CM

## 2022-05-08 DIAGNOSIS — I38 Endocarditis, valve unspecified: Secondary | ICD-10-CM | POA: Diagnosis not present

## 2022-05-08 LAB — ECHOCARDIOGRAM COMPLETE
AR max vel: 1.69 cm2
AV Area VTI: 1.79 cm2
AV Area mean vel: 1.79 cm2
AV Mean grad: 5 mmHg
AV Peak grad: 11.4 mmHg
Ao pk vel: 1.69 m/s
Area-P 1/2: 4.06 cm2
Calc EF: 54.1 %
S' Lateral: 3.5 cm
Single Plane A2C EF: 53.1 %
Single Plane A4C EF: 55.6 %

## 2022-05-15 ENCOUNTER — Ambulatory Visit
Admission: EM | Admit: 2022-05-15 | Discharge: 2022-05-15 | Disposition: A | Discharge status: Home / Self Care | Payer: No Typology Code available for payment source | Attending: Internal Medicine | Admitting: Internal Medicine

## 2022-05-15 DIAGNOSIS — J209 Acute bronchitis, unspecified: Secondary | ICD-10-CM | POA: Diagnosis not present

## 2022-05-15 MED ORDER — DOXYCYCLINE MONOHYDRATE 100 MG PO CAPS: 100.0000 mg | ORAL_CAPSULE | Freq: Two times a day (BID) | ORAL | 0 refills | Status: DC

## 2022-05-15 MED ORDER — FLUTICASONE PROPIONATE 50 MCG/ACT NA SUSP: 2.0000 | Freq: Every day | NASAL | 0 refills | Status: AC

## 2022-05-15 MED ORDER — ALBUTEROL SULFATE HFA 108 (90 BASE) MCG/ACT IN AERS
2.0000 | INHALATION_SPRAY | RESPIRATORY_TRACT | 0 refills | Status: AC | PRN
Start: 2022-05-15 — End: ?

## 2022-05-15 NOTE — ED Provider Notes (Signed)
[facility]    CSN: [id number] Arrival date & time: 05/15/22  1453      History   Chief Complaint No chief complaint on file.   HPI Abigail Moody is a 76 y.o. female who presents with nose congestion and non productive cough x 12 days. She has been getting worse in the past 2 days, and Tessalon and OTC cough med has not helped. Has had coughing fits and been wheezing a little. She is out of Flonase, and has had to use Albuterol in the past. She is unable to cough anything up, and feels PNC but cant spit it out.     Past Medical History:  Diagnosis Date   Depression    Diffuse cystic mastopathy    GERD (gastroesophageal reflux disease)    Headache    migraines in distant past   Hemorrhoid    Hyperlipidemia    Hypertension    Seizures ([place])    none for over 1 yr (since on Keppra)    Patient Active Problem List   Diagnosis Date Noted   Palpitations 04/18/2022   PVC's (premature ventricular contractions) 04/18/2022   Valvular heart disease 04/18/2022   Essential hypertension 04/18/2022   Amnesia 08/06/2016   Fibrocystic breast 02/24/2014    Past Surgical History:  Procedure Laterality Date   ANKLE SURGERY Right 2006   CARPAL TUNNEL RELEASE Bilateral    CATARACT EXTRACTION W/PHACO Right 06/12/2020   Procedure: CATARACT EXTRACTION PHACO AND INTRAOCULAR LENS PLACEMENT ([place]) RIGHT TORIC LENS 5.27 00:34.5 ;  Surgeon: Birder Robson, MD;  Location: [place];  Service: Ophthalmology;  Laterality: Right;  Latex   CATARACT EXTRACTION W/PHACO Left 07/31/2020   Procedure: CATARACT EXTRACTION PHACO AND INTRAOCULAR LENS PLACEMENT ([facility]) LEFT TORIC LENS 4.20   00:26.9;  Surgeon: Birder Robson, MD;  Location: [place];  Service: Ophthalmology;  Laterality: Left;   COLONOSCOPY  2008   Dr. Epifanio Lesches   COLONOSCOPY WITH PROPOFOL N/A 03/31/2018   Procedure: COLONOSCOPY WITH PROPOFOL;  Surgeon: Manya Silvas, MD;  Location: [facility] ENDOSCOPY;   Service: Endoscopy;  Laterality: N/A;   MOUTH SURGERY  2010   TOE SURGERY Left 2006   TONSILLECTOMY  1959   TRIGGER FINGER RELEASE Right 2013-2014?    OB History   No obstetric history on file.      Home Medications    Prior to Admission medications   Medication Sig Start Date End Date Taking? Authorizing Provider  amLODipine (NORVASC) 5 MG tablet Take 5 mg by mouth daily.   Yes [provider]  aspirin 81 MG EC tablet Take 81 mg by mouth daily.   Yes [provider]  atorvastatin (LIPITOR) 10 MG tablet Take 10 mg by mouth daily.   Yes [provider]  Biotin 10 MG TABS Take 1 tablet by mouth daily.   Yes [provider]  Cholecalciferol 50 MCG (2000 UT) TABS Take by mouth daily.   Yes [provider]  cyanocobalamin 1000 MCG tablet Take 1,000 mcg by mouth daily.   Yes [provider]  escitalopram (LEXAPRO) 10 MG tablet Take 10 mg by mouth daily.   Yes [provider]  ferrous sulfate 325 (65 FE) MG tablet Take 325 mg by mouth daily with breakfast.    Yes [provider]  fexofenadine (ALLEGRA) 180 MG tablet Take 180 mg by mouth daily.   Yes [provider]  fluticasone (FLONASE) 50 MCG/ACT nasal spray as needed. 02/12/14  Yes [provider]  levETIRAcetam (KEPPRA) 500 MG tablet Take 500 mg by mouth 2 (two) times daily.   Yes [provider]  melatonin 5 MG TABS Take 5 mg by mouth at bedtime as needed.   Yes [provider]  minoxidil (LONITEN) 2.5 MG tablet Take 1/2 tablet a day to help increase hair 04/10/22  Yes Ralene Bathe, MD  Multiple Vitamins-Minerals (MULTIVITAMIN WITH MINERALS) tablet Take 1 tablet by mouth daily.   Yes [provider]  omeprazole (PRILOSEC) 20 MG capsule Take 20 mg by mouth daily.   Yes [provider]  PROAIR HFA 108 (90 BASE) MCG/ACT inhaler as needed. 01/28/14  Yes [provider]    Family History Family History   Problem Relation Age of Onset   Heart attack Mother    Cancer Father        prostate   Heart attack Sister 63   Cancer Brother        half brother/lung   Breast cancer Other     Social History Social History   Tobacco Use   Smoking status: Never   Smokeless tobacco: Never  Vaping Use   Vaping Use: Never used  Substance Use Topics   Alcohol use: Not Currently   Drug use: No     Allergies   Erythromycin, Latex, Levaquin [levofloxacin], and Nickel   Review of Systems Review of Systems  Constitutional:  Positive for chills, diaphoresis and fatigue. Negative for activity change, appetite change and fever.  HENT:  Positive for congestion, postnasal drip and sore throat. Negative for ear discharge, ear pain, rhinorrhea and trouble swallowing.   Eyes:  Negative for discharge.  Respiratory:  Positive for cough and wheezing. Negative for chest tightness and shortness of breath.   Skin:  Negative for rash.  Hematological:  Negative for adenopathy.    Physical Exam Triage Vital Signs ED Triage Vitals  Enc Vitals Group     BP 05/15/22 1626 127/69     Pulse Rate 05/15/22 1626 66     Resp 05/15/22 1626 20     Temp 05/15/22 1626 98.5 F (36.9 C)     Temp Source 05/15/22 1626 Oral     SpO2 05/15/22 1626 98 %     Weight 05/15/22 1624 205 lb (93 kg)     Height 05/15/22 1624 '5\' 3"'$  (1.6 m)     Head Circumference --      Peak Flow --      Pain Score 05/15/22 1624 0     Pain Loc --      Pain Edu? --      Excl. in [place]? --    No data found.  Updated Vital Signs BP 127/69 (BP Location: Right Arm)   Pulse 66   Temp 98.5 F (36.9 C) (Oral)   Resp 20   Ht '5\' 3"'$  (1.6 m)   Wt 205 lb (93 kg)   SpO2 98%   BMI 36.31 kg/m   Visual Acuity Right Eye Distance:   Left Eye Distance:   Bilateral Distance:    Right Eye Near:   Left Eye Near:    Bilateral Near:     Physical Exam Physical Exam Vitals signs and nursing note reviewed.  Constitutional:      General: She is not  in acute distress.    Appearance: Normal appearance. She is not ill-appearing, toxic-appearing or diaphoretic.  HENT:     Head: Normocephalic.     Right Ear: Tympanic membrane,  ear canal and external ear normal.     Left Ear: Tympanic membrane, ear canal and external ear normal.     Nose: Nose normal.     Mouth/Throat:     Mouth: Mucous membranes are moist.  Eyes:     General: No scleral icterus.       Right eye: No discharge.        Left eye: No discharge.     Conjunctiva/sclera: Conjunctivae normal.  Neck:     Musculoskeletal: Neck supple. No neck rigidity.  Cardiovascular:     Rate and Rhythm: Normal rate and regular rhythm.     Heart sounds: No murmur.  Pulmonary:     Effort: Pulmonary effort is normal.     Breath sounds: Normal breath sounds.  Musculoskeletal: Normal range of motion.  Lymphadenopathy:     Cervical: No cervical adenopathy.  Skin:    General: Skin is warm and dry.     Coloration: Skin is not jaundiced.     Findings: No rash.  Neurological:     Mental Status: She is alert and oriented to person, place, and time.     Gait: Gait normal.  Psychiatric:        Mood and Affect: Mood normal.        Behavior: Behavior normal.        Thought Content: Thought content normal.        Judgment: Judgment normal.  ]  UC Treatments / Results  Labs (all labs ordered are listed, but only abnormal results are displayed) Labs Reviewed - No data to display  EKG   Radiology No results found.  Procedures Procedures (including critical care time)  Medications Ordered in UC Medications - No data to display  Initial Impression / Assessment and Plan / UC Course  I have reviewed the triage vital signs and the nursing notes.  Bronchitis I placed her on Doxy, Flonase and Albuterol as noted.  Final Clinical Impressions(s) / UC Diagnoses   Final diagnoses:  None   Discharge Instructions   None    ED Prescriptions   None    PDMP not reviewed this  encounter.   Shelby Mattocks, PA-C 05/15/22 1707

## 2022-05-15 NOTE — ED Triage Notes (Signed)
Patient c/o congestion, dry cough -- started about 12 days ago.

## 2022-05-21 ENCOUNTER — Encounter: Payer: Self-pay | Admitting: Nurse Practitioner

## 2022-05-21 ENCOUNTER — Ambulatory Visit (INDEPENDENT_AMBULATORY_CARE_PROVIDER_SITE_OTHER): Payer: No Typology Code available for payment source | Admitting: Nurse Practitioner

## 2022-05-21 VITALS — BP 120/86 | HR 72 | Ht 63.0 in | Wt 204.0 lb

## 2022-05-21 DIAGNOSIS — I6522 Occlusion and stenosis of left carotid artery: Secondary | ICD-10-CM | POA: Diagnosis not present

## 2022-05-21 DIAGNOSIS — E782 Mixed hyperlipidemia: Secondary | ICD-10-CM

## 2022-05-21 DIAGNOSIS — I10 Essential (primary) hypertension: Secondary | ICD-10-CM

## 2022-05-21 DIAGNOSIS — N183 Chronic kidney disease, stage 3 unspecified: Secondary | ICD-10-CM

## 2022-05-21 DIAGNOSIS — R002 Palpitations: Secondary | ICD-10-CM | POA: Diagnosis not present

## 2022-05-21 DIAGNOSIS — I38 Endocarditis, valve unspecified: Secondary | ICD-10-CM | POA: Diagnosis not present

## 2022-05-21 NOTE — Progress Notes (Signed)
Office Visit    Patient Name: Abigail Moody Date of Encounter: 05/21/2022  Primary Care Provider:  Adin Hector, MD Primary Cardiologist:  Nelva Bush, MD  Chief Complaint    76 y/o ?  With a history of carotid arterial disease, hypertension, hyperlipidemia, obstructive sleep apnea, reactive airway disease, CKD III, sz d/o w/ mild cognitive impairment, OA, acid reflux, and anxiety/depression, who presents for f/u of palpitations.  Past Medical History    Past Medical History:  Diagnosis Date   CKD (chronic kidney disease) stage 3, GFR 30-59 ml/min (HCC)    Depression    Diffuse cystic mastopathy    GERD (gastroesophageal reflux disease)    Headache    migraines in distant past   Hemorrhoid    Hyperlipidemia    Hypertension    OSA (obstructive sleep apnea)    Palpitations    a. 02/2022 Holter: Sinus rhythm w/ PACs, PVCs, and brief runs of NSVT/PSVT. No Afib; b. 04/2022 Echo: EF 55-60%, no rmwa, mild LVH, GrI DD, nl RV fxn, triv MR.   Seizures ([place])    none for over 1 yr (since on Keppra)   Past Surgical History:  Procedure Laterality Date   ANKLE SURGERY Right 2006   CARPAL TUNNEL RELEASE Bilateral    CATARACT EXTRACTION W/PHACO Right 06/12/2020   Procedure: CATARACT EXTRACTION PHACO AND INTRAOCULAR LENS PLACEMENT ([place]) RIGHT TORIC LENS 5.27 00:34.5 ;  Surgeon: Birder Robson, MD;  Location: [facility];  Service: Ophthalmology;  Laterality: Right;  Latex   CATARACT EXTRACTION W/PHACO Left 07/31/2020   Procedure: CATARACT EXTRACTION PHACO AND INTRAOCULAR LENS PLACEMENT ([facility]) LEFT TORIC LENS 4.20   00:26.9;  Surgeon: Birder Robson, MD;  Location: [facility];  Service: Ophthalmology;  Laterality: Left;   COLONOSCOPY  2008   Dr. Epifanio Lesches   COLONOSCOPY WITH PROPOFOL N/A 03/31/2018   Procedure: COLONOSCOPY WITH PROPOFOL;  Surgeon: Manya Silvas, MD;  Location: [facility] ENDOSCOPY;  Service: Endoscopy;  Laterality: N/A;   MOUTH SURGERY  2010    TOE SURGERY Left 2006   TONSILLECTOMY  1959   TRIGGER FINGER RELEASE Right 2013-2014?    Allergies  Allergies  Allergen Reactions   Erythromycin Itching   Latex     Rash (some bandaids and when gloves are worn)   Levaquin [Levofloxacin] Other (See Comments)    Lethargic, no energy, cannot function for daily activities per patient.    Nickel Rash    History of Present Illness    76 y/o ?  With a history of carotid arterial disease, hypertension, hyperlipidemia, obstructive sleep apnea, reactive airway disease, CKD III, sz d/o w/ mild cognitive impairment, OA, acid reflux, and anxiety/depression.  She previously underwent echo in 07/2016, showing an EF of 50% w/ Grade I diast dysfunction, and mild mitral regurgitation.  In March 2023, she reached out to her primary care provider's office secondary to palpitations described as a vibratory sensation in her left upper chest.  A Holter monitor was placed and showed predominantly sinus rhythm with occasional PACs, PVCs, and brief runs of PSVT/nonsustained VT.  Notes indicated that slow atrial fibrillation cannot be excluded.  She was subsequently referred to Dr. Saunders Revel, whom she saw on 4/27.  An echocardiogram was carried out and showed an EF of 55 to 60% without regional wall motion abnormalities, mild LVH, grade 1 diastolic dysfunction, and trivial MR.  Subsequent review of Holter monitor tracings did not show any evidence of atrial fibrillation.  Since last  seen in cardiology clinic in April 27, [name] notes that she developed nasal congestion and nonproductive cough, eventually prompting her to present to urgent care on May 25, which she was diagnosed with bronchitis and placed on albuterol and doxycycline.  Symptoms have improved since then.  She does note some increase in heart rate following albuterol inhalations but overall has been feeling better.  She has not had any significant palpitations and notes reasonable activity at home without  symptoms or limitations.  She denies chest pain, dyspnea, PND, orthopnea, dizziness, syncope, or early satiety.  She sometimes notes lower extremity swelling prior to bed.  Home Medications    Current Outpatient Medications  Medication Sig Dispense Refill   albuterol (PROAIR HFA) 108 (90 Base) MCG/ACT inhaler Inhale 2 puffs into the lungs as needed. 18 g 0   amLODipine (NORVASC) 10 MG tablet Take 10 mg by mouth daily.     aspirin 81 MG EC tablet Take 81 mg by mouth daily.     atorvastatin (LIPITOR) 10 MG tablet Take 10 mg by mouth daily.     Biotin 10 MG TABS Take 1 tablet by mouth daily.     Cholecalciferol 50 MCG (2000 UT) TABS Take by mouth daily.     cyanocobalamin 1000 MCG tablet Take 1,000 mcg by mouth daily.     doxycycline (MONODOX) 100 MG capsule Take 1 capsule (100 mg total) by mouth 2 (two) times daily. 20 capsule 0   escitalopram (LEXAPRO) 10 MG tablet Take 10 mg by mouth daily.     ferrous sulfate 325 (65 FE) MG tablet Take 325 mg by mouth daily with breakfast.      fexofenadine (ALLEGRA) 180 MG tablet Take 180 mg by mouth daily.     fluticasone (FLONASE) 50 MCG/ACT nasal spray Place 2 sprays into both nostrils daily. 18.2 g 0   levETIRAcetam (KEPPRA) 500 MG tablet Take 500 mg by mouth 2 (two) times daily.     melatonin 5 MG TABS Take 10 mg by mouth at bedtime as needed.     minoxidil (LONITEN) 2.5 MG tablet Take 1/2 tablet a day to help increase hair 30 tablet 2   Multiple Vitamins-Minerals (MULTIVITAMIN WITH MINERALS) tablet Take 1 tablet by mouth daily.     omeprazole (PRILOSEC) 20 MG capsule Take 20 mg by mouth daily.     No current facility-administered medications for this visit.     Review of Systems    Currently recovering from bronchitis.  She denies chest pain, palpitations, dyspnea, PND, orthopnea, dizziness, syncope, edema, or early satiety.  All other systems reviewed and are otherwise negative except as noted above.    Physical Exam    VS:  BP 120/86 (BP  Location: Left Arm, Patient Position: Sitting, Cuff Size: Large)   Pulse 72   Ht '5\' 3"'$  (1.6 m)   Wt 204 lb (92.5 kg)   SpO2 96%   BMI 36.14 kg/m  , BMI Body mass index is 36.14 kg/m.     GEN: Well nourished, well developed, in no acute distress. HEENT: normal. Neck: Supple, no JVD, carotid bruits, or masses. Cardiac: RRR, no murmurs, rubs, or gallops. No clubbing, cyanosis, edema.  Radials/PT 2+ and equal bilaterally.  Respiratory:  Respirations regular and unlabored, clear to auscultation bilaterally. GI: Soft, nontender, nondistended, BS + x 4. MS: no deformity or atrophy. Skin: warm and dry, no rash. Neuro:  Strength and sensation are intact. Psych: Normal affect.  Accessory Clinical Findings  Labs dated April 02, 2012  Sodium 143, potassium 4.1, chloride 107, CO2 28.5, BUN 20, creatinine 1.2, glucose 90, GFR 44  Labs dated February 25, 2022  Total cholesterol 151, triglycerides 90, HDL 53.4, LDL 80 TSH 1.199  Assessment & Plan    1.  Palpitations/PVCs: Ms. Walen previously wore Holter monitor in the setting of palpitations in March, which showed occasional PACs and PVCs with a few brief runs of PSVT and nonsustained VT.  Recent echo showed normal LV function with grade 1 diastolic dysfunction and trivial mitral regurgitation.  She has not had any significant palpitations since her last visit and we reviewed her testing results today and reassurance provided.  Resting heart rate and blood pressure are stable.  No medication changes today.  Would consider initiation of low-dose beta-blocker in the setting of recurrent palpitations in the future.  2.  Valvular heart disease: Echocardiogram in 2018 at [facility] clinic showed mild MR and TR.  Echo last week showed a trivial MR.  Discussed with patient today.  Reassurance provided.  3.  Essential hypertension: Mild diastolic hypertension today with a blood pressure of 120/86.  She says this is unusual for her and that typically  diastolic pressure is less than 80.  She remains on amlodipine therapy.  4.  Hyperlipidemia: LDL of 80 in March 2023.  She remains on statin therapy.  5.  Carotid arterial disease: Ultrasound in 2017 showed less than 50% left carotid stenosis.  She had a subsequent ultrasound in 2018 through her primary care provider-this report is not available.  She remains on aspirin and statin therapy.  6.  Stage III chronic kidney disease: Creatinine was stable at 1.2 in April.  7.  Disposition: Follow-up in 6 months or sooner if necessary.   Murray Hodgkins, NP 05/21/2022, 3:15 PM

## 2022-05-21 NOTE — Patient Instructions (Signed)
Medication Instructions:  No changes at this time  *If you need a refill on your cardiac medications before your next appointment, please call your pharmacy*   Lab Work: None  If you have labs (blood work) drawn today and your tests are completely normal, you will receive your results only by: [place] (if you have MyChart) OR A paper copy in the mail If you have any lab test that is abnormal or we need to change your treatment, we will call you to review the results.   Testing/Procedures: None   Follow-Up: At [facility], you and your health needs are our priority.  As part of our continuing mission to provide you with exceptional heart care, we have created designated Provider Care Teams.  These Care Teams include your primary Cardiologist (physician) and Advanced Practice Providers (APPs -  Physician Assistants and Nurse Practitioners) who all work together to provide you with the care you need, when you need it.  We recommend signing up for the patient portal called "MyChart".  Sign up information is provided on this After Visit Summary.  MyChart is used to connect with patients for Virtual Visits (Telemedicine).  Patients are able to view lab/test results, encounter notes, upcoming appointments, etc.  Non-urgent messages can be sent to your provider as well.   To learn more about what you can do with MyChart, go to [email or web address].    Your next appointment:   6 month(s)  The format for your next appointment:   In Person  Provider:   Nelva Bush, MD or Murray Hodgkins, NP        Important Information About Sugar

## 2022-07-30 ENCOUNTER — Ambulatory Visit
Admission: EM | Admit: 2022-07-30 | Discharge: 2022-07-30 | Disposition: A | Discharge status: Home / Self Care | Payer: No Typology Code available for payment source | Attending: Physician Assistant | Admitting: Physician Assistant

## 2022-07-30 ENCOUNTER — Other Ambulatory Visit: Payer: Self-pay

## 2022-07-30 DIAGNOSIS — W5501XA Bitten by cat, initial encounter: Secondary | ICD-10-CM

## 2022-07-30 DIAGNOSIS — L03114 Cellulitis of left upper limb: Secondary | ICD-10-CM

## 2022-07-30 DIAGNOSIS — S61452A Open bite of left hand, initial encounter: Secondary | ICD-10-CM | POA: Diagnosis not present

## 2022-07-30 DIAGNOSIS — L089 Local infection of the skin and subcutaneous tissue, unspecified: Secondary | ICD-10-CM | POA: Diagnosis not present

## 2022-07-30 MED ORDER — AMOXICILLIN-POT CLAVULANATE 875-125 MG PO TABS: 1.0000 | ORAL_TABLET | Freq: Two times a day (BID) | ORAL | 0 refills | Status: AC

## 2022-07-30 NOTE — ED Notes (Signed)
Patient is being discharged from the Urgent Care and sent to the Emergency Department via POV . Per Christene Slates, PA, patient is in need of higher level of care due to rabies injections after cat bite not UTD on rabies vaccine . Patient is aware and verbalizes understanding of plan of care.  Vitals:   07/30/22 1401  BP: 134/83  Pulse: 71  Resp: 18  Temp: 98.5 F (36.9 C)  SpO2: 100%

## 2022-07-30 NOTE — Discharge Instructions (Addendum)
-  I have sent antibiotics for the infection related to the cat bite. - We also discussed the importance of you receiving the rabies vaccine if you are unable to have the cat tested and get results back very quickly.  You would have to initiate rabies vaccination in the emergency department which would be best done as soon as possible.  It is already been a couple of days so I really would recommend you getting a rabies vaccination in the emergency department.  You could go to [facility] and just let them know you need a rabies vaccination and you have already been seen for the cat bite/soft tissue infection. - Keep the area clean with soap and water.  Do not apply any more hydroperoxide.  May apply Neosporin.  May ice and elevate the hand.  The swelling and redness should improve over the next 2 days but if you feel that the redness and swelling worsen or you notice a red streak up your wrist/arm, have a fever, swollen lymph nodes, you should be seen again immediately.

## 2022-07-30 NOTE — ED Triage Notes (Addendum)
Pt is here with a cat bite to her left hand this happened 2 days ago, pt has not taken any meds to relieve discomfort. Pt states her cat does NOT have a rabies vaccination.

## 2022-07-30 NOTE — ED Provider Notes (Signed)
[facility]    CSN: [id number] Arrival date & time: 07/30/22  1338      History   Chief Complaint Chief Complaint  Patient presents with   Animal Bite    HPI Abigail Moody is a 76 y.o. female presenting for cat bite of the left hand that occurred 2 to 3 days ago.  Patient reports she has had increased swelling, redness and warmth of her hand since.  She says the cat is a Energy manager and she does not know its rabies status.  She says she does not want to go to the emergency department and wait to get a rabies vaccination.  She says she will take it to the vet and get tested.  She is complaining that her hand hurts.  Denies fever.  Has not had any pustular drainage.  Has been keeping it clean with hydrogen peroxide.  HPI  Past Medical History:  Diagnosis Date   CKD (chronic kidney disease) stage 3, GFR 30-59 ml/min (HCC)    Depression    Diffuse cystic mastopathy    GERD (gastroesophageal reflux disease)    Headache    migraines in distant past   Hemorrhoid    Hyperlipidemia    Hypertension    OSA (obstructive sleep apnea)    Palpitations    a. 02/2022 Holter: Sinus rhythm w/ PACs, PVCs, and brief runs of NSVT/PSVT. No Afib; b. 04/2022 Echo: EF 55-60%, no rmwa, mild LVH, GrI DD, nl RV fxn, triv MR.   Seizures ([place])    none for over 1 yr (since on Keppra)    Patient Active Problem List   Diagnosis Date Noted   Palpitations 04/18/2022   PVC's (premature ventricular contractions) 04/18/2022   Valvular heart disease 04/18/2022   Essential hypertension 04/18/2022   Amnesia 08/06/2016   Fibrocystic breast 02/24/2014    Past Surgical History:  Procedure Laterality Date   ANKLE SURGERY Right 2006   CARPAL TUNNEL RELEASE Bilateral    CATARACT EXTRACTION W/PHACO Right 06/12/2020   Procedure: CATARACT EXTRACTION PHACO AND INTRAOCULAR LENS PLACEMENT ([place]) RIGHT TORIC LENS 5.27 00:34.5 ;  Surgeon: Birder Robson, MD;  Location: [place];  Service:  Ophthalmology;  Laterality: Right;  Latex   CATARACT EXTRACTION W/PHACO Left 07/31/2020   Procedure: CATARACT EXTRACTION PHACO AND INTRAOCULAR LENS PLACEMENT ([facility]) LEFT TORIC LENS 4.20   00:26.9;  Surgeon: Birder Robson, MD;  Location: [place];  Service: Ophthalmology;  Laterality: Left;   COLONOSCOPY  2008   Dr. Epifanio Lesches   COLONOSCOPY WITH PROPOFOL N/A 03/31/2018   Procedure: COLONOSCOPY WITH PROPOFOL;  Surgeon: Manya Silvas, MD;  Location[facility] ENDOSCOPY;  Service: Endoscopy;  Laterality: N/A;   MOUTH SURGERY  2010   TOE SURGERY Left 2006   TONSILLECTOMY  1959   TRIGGER FINGER RELEASE Right 2013-2014?    OB History   No obstetric history on file.      Home Medications    Prior to Admission medications   Medication Sig Start Date End Date Taking? Authorizing Provider  amoxicillin-clavulanate (AUGMENTIN) 875-125 MG tablet Take 1 tablet by mouth every 12 (twelve) hours for 7 days. 07/30/22 08/06/22 Yes Danton Clap, PA-C  albuterol (PROAIR HFA) 108 (90 Base) MCG/ACT inhaler Inhale 2 puffs into the lungs as needed. 05/15/22   Rodriguez-Southworth, Sunday Spillers, PA-C  amLODipine (NORVASC) 10 MG tablet Take 10 mg by mouth daily. 05/20/22   [provider]  aspirin 81 MG EC tablet Take 81 mg by mouth  daily.    [provider]  atorvastatin (LIPITOR) 10 MG tablet Take 10 mg by mouth daily.    [provider]  Biotin 10 MG TABS Take 1 tablet by mouth daily.    [provider]  Cholecalciferol 50 MCG (2000 UT) TABS Take by mouth daily.    [provider]  cyanocobalamin 1000 MCG tablet Take 1,000 mcg by mouth daily.    [provider]  escitalopram (LEXAPRO) 10 MG tablet Take 10 mg by mouth daily.    [provider]  ferrous sulfate 325 (65 FE) MG tablet Take 325 mg by mouth daily with breakfast.     [provider]  fexofenadine (ALLEGRA) 180 MG tablet Take 180 mg by mouth daily.    [provider]   fluticasone (FLONASE) 50 MCG/ACT nasal spray Place 2 sprays into both nostrils daily. 05/15/22   Rodriguez-Southworth, Sunday Spillers, PA-C  levETIRAcetam (KEPPRA) 500 MG tablet Take 500 mg by mouth 2 (two) times daily.    [provider]  melatonin 5 MG TABS Take 10 mg by mouth at bedtime as needed.    [provider]  minoxidil (LONITEN) 2.5 MG tablet Take 1/2 tablet a day to help increase hair 04/10/22   Ralene Bathe, MD  Multiple Vitamins-Minerals (MULTIVITAMIN WITH MINERALS) tablet Take 1 tablet by mouth daily.    [provider]  omeprazole (PRILOSEC) 20 MG capsule Take 20 mg by mouth daily.    [provider]    Family History Family History  Problem Relation Age of Onset   Heart attack Mother    Cancer Father        prostate   Heart attack Sister 15   Cancer Brother        half brother/lung   Breast cancer Other     Social History Social History   Tobacco Use   Smoking status: Never   Smokeless tobacco: Never  Vaping Use   Vaping Use: Never used  Substance Use Topics   Alcohol use: Not Currently   Drug use: No     Allergies   Erythromycin, Latex, Levaquin [levofloxacin], and Nickel   Review of Systems Review of Systems  Constitutional:  Negative for fatigue and fever.  Musculoskeletal:  Positive for joint swelling. Negative for arthralgias.  Skin:  Positive for color change and wound.  Neurological:  Negative for weakness and numbness.  Hematological:  Negative for adenopathy.     Physical Exam Triage Vital Signs ED Triage Vitals  Enc Vitals Group     BP 07/30/22 1401 134/83     Pulse Rate 07/30/22 1401 71     Resp 07/30/22 1401 18     Temp 07/30/22 1401 98.5 F (36.9 C)     Temp Source 07/30/22 1401 Oral     SpO2 07/30/22 1401 100 %     Weight --      Height --      Head Circumference --      Peak Flow --      Pain Score 07/30/22 1400 3     Pain Loc --      Pain Edu? --      Excl. in [place]? --    No data  found.  Updated Vital Signs BP 134/83 (BP Location: Left Arm)   Pulse 71   Temp 98.5 F (36.9 C) (Oral)   Resp 18   SpO2 100%     Physical Exam Vitals and nursing note reviewed.  Constitutional:      General: She is not in acute distress.    Appearance: Normal appearance. She is not ill-appearing or toxic-appearing.  HENT:     Head: Normocephalic and atraumatic.  Eyes:     General: No scleral icterus.       Right eye: No discharge.        Left eye: No discharge.     Conjunctiva/sclera: Conjunctivae normal.  Cardiovascular:     Rate and Rhythm: Normal rate and regular rhythm.     Pulses: Normal pulses.  Pulmonary:     Effort: Pulmonary effort is normal. No respiratory distress.  Musculoskeletal:     Cervical back: Neck supple.  Skin:    General: Skin is dry.     Comments: Superficial puncture wounds about the base of the left thumb with erythema, warmth and swelling of dorsal hand.  Area is tender to palpation.  Neurological:     General: No focal deficit present.     Mental Status: She is alert. Mental status is at baseline.     Motor: No weakness.     Gait: Gait normal.  Psychiatric:        Mood and Affect: Mood normal.        Behavior: Behavior normal.        Thought Content: Thought content normal.      UC Treatments / Results  Labs (all labs ordered are listed, but only abnormal results are displayed) Labs Reviewed - No data to display  EKG   Radiology No results found.  Procedures Procedures (including critical care time)  Medications Ordered in UC Medications - No data to display  Initial Impression / Assessment and Plan / UC Course  I have reviewed the triage vital signs and the nursing notes.  Pertinent labs & imaging results that were available during my care of the patient were reviewed by me and considered in my medical decision making (see chart for details).  76 year old female presenting for cat bite that occurred a couple of days ago.   Cat is a stray.  Does not know his vaccination status.  She believes that she is up-to-date with tetanus.  On examination today she does have puncture wound consistent with a cat bite at the base of her left thumb/dorsal hand and moderate swelling, erythema and warmth of the entire dorsal hand.  I discussion with patient and advised her since she does not know the vaccination status of the cat she really should go to the emergency department to have rabies vaccination.  Advised her that it can take several days for rabies symptoms to appear and she should get the vaccination as soon as possible.  I do not believe she would have results back on the cat soon enough.  She is to contact a vet first and then consider going to [facility] ED.  I advised her to try to figure this out today as the sooner she is vaccinated better.  Will go ahead and treat her for the cat bite infection with Augmentin.  Advised close monitoring and if no improvement in the infection or worsening symptoms, she should be seen again and reexamined.   Final Clinical Impressions(s) / UC Diagnoses   Final diagnoses:  Cellulitis of left hand  Cat bite, initial encounter     Discharge Instructions      -I have sent antibiotics for the infection related to the cat bite. - We also discussed the importance of you receiving the  rabies vaccine if you are unable to have the cat tested and get results back very quickly.  You would have to initiate rabies vaccination in the emergency department which would be best done as soon as possible.  It is already been a couple of days so I really would recommend you getting a rabies vaccination in the emergency department.  You could go to [facility] and just let them know you need a rabies vaccination and you have already been seen for the cat bite/soft tissue infection. - Keep the area clean with soap and water.  Do not apply any more hydroperoxide.  May apply Neosporin.  May ice and  elevate the hand.  The swelling and redness should improve over the next 2 days but if you feel that the redness and swelling worsen or you notice a red streak up your wrist/arm, have a fever, swollen lymph nodes, you should be seen again immediately.     ED Prescriptions     Medication Sig Dispense Auth. Provider   amoxicillin-clavulanate (AUGMENTIN) 875-125 MG tablet Take 1 tablet by mouth every 12 (twelve) hours for 7 days. 14 tablet Gretta Cool      PDMP not reviewed this encounter.   Danton Clap, PA-C 07/30/22 1559

## 2022-09-01 ENCOUNTER — Other Ambulatory Visit: Payer: Self-pay | Admitting: Internal Medicine

## 2022-09-01 DIAGNOSIS — E7849 Other hyperlipidemia: Secondary | ICD-10-CM | POA: Diagnosis not present

## 2022-09-01 DIAGNOSIS — Z1231 Encounter for screening mammogram for malignant neoplasm of breast: Secondary | ICD-10-CM

## 2022-09-01 DIAGNOSIS — R3129 Other microscopic hematuria: Secondary | ICD-10-CM | POA: Diagnosis not present

## 2022-09-01 DIAGNOSIS — N183 Chronic kidney disease, stage 3 unspecified: Secondary | ICD-10-CM | POA: Diagnosis not present

## 2022-09-08 DIAGNOSIS — G40109 Localization-related (focal) (partial) symptomatic epilepsy and epileptic syndromes with simple partial seizures, not intractable, without status epilepticus: Secondary | ICD-10-CM | POA: Diagnosis not present

## 2022-09-08 DIAGNOSIS — Z23 Encounter for immunization: Secondary | ICD-10-CM | POA: Diagnosis not present

## 2022-09-08 DIAGNOSIS — G3184 Mild cognitive impairment, so stated: Secondary | ICD-10-CM | POA: Diagnosis not present

## 2022-09-08 DIAGNOSIS — F3342 Major depressive disorder, recurrent, in full remission: Secondary | ICD-10-CM | POA: Diagnosis not present

## 2022-09-08 DIAGNOSIS — Z0000 Encounter for general adult medical examination without abnormal findings: Secondary | ICD-10-CM | POA: Diagnosis not present

## 2022-09-08 DIAGNOSIS — I10 Essential (primary) hypertension: Secondary | ICD-10-CM | POA: Diagnosis not present

## 2022-09-08 DIAGNOSIS — R569 Unspecified convulsions: Secondary | ICD-10-CM | POA: Diagnosis not present

## 2022-09-08 DIAGNOSIS — I779 Disorder of arteries and arterioles, unspecified: Secondary | ICD-10-CM | POA: Diagnosis not present

## 2022-09-08 DIAGNOSIS — N1831 Chronic kidney disease, stage 3a: Secondary | ICD-10-CM | POA: Diagnosis not present

## 2022-09-08 DIAGNOSIS — J4520 Mild intermittent asthma, uncomplicated: Secondary | ICD-10-CM | POA: Diagnosis not present

## 2022-09-08 DIAGNOSIS — E559 Vitamin D deficiency, unspecified: Secondary | ICD-10-CM | POA: Diagnosis not present

## 2022-09-08 DIAGNOSIS — E7849 Other hyperlipidemia: Secondary | ICD-10-CM | POA: Diagnosis not present

## 2022-09-17 ENCOUNTER — Ambulatory Visit
Admission: RE | Admit: 2022-09-17 | Discharge: 2022-09-17 | Disposition: A | Discharge status: Home / Self Care | Payer: No Typology Code available for payment source | Source: Ambulatory Visit | Attending: Internal Medicine | Admitting: Internal Medicine

## 2022-09-17 DIAGNOSIS — Z1231 Encounter for screening mammogram for malignant neoplasm of breast: Secondary | ICD-10-CM | POA: Insufficient documentation

## 2022-10-13 ENCOUNTER — Ambulatory Visit (INDEPENDENT_AMBULATORY_CARE_PROVIDER_SITE_OTHER): Payer: No Typology Code available for payment source | Admitting: Dermatology

## 2022-10-13 DIAGNOSIS — L649 Androgenic alopecia, unspecified: Secondary | ICD-10-CM | POA: Diagnosis not present

## 2022-10-13 DIAGNOSIS — L820 Inflamed seborrheic keratosis: Secondary | ICD-10-CM

## 2022-10-13 DIAGNOSIS — L821 Other seborrheic keratosis: Secondary | ICD-10-CM

## 2022-10-13 DIAGNOSIS — L814 Other melanin hyperpigmentation: Secondary | ICD-10-CM | POA: Diagnosis not present

## 2022-10-13 DIAGNOSIS — L578 Other skin changes due to chronic exposure to nonionizing radiation: Secondary | ICD-10-CM

## 2022-10-13 DIAGNOSIS — L84 Corns and callosities: Secondary | ICD-10-CM

## 2022-10-13 NOTE — Progress Notes (Signed)
Follow-Up Visit   Subjective  Abigail Moody is a 76 y.o. female who presents for the following: Irregular skin lesions (On the R lower leg and chest - very irritated, patient would like lesions checked and treated today ). The patient has spots, moles and lesions to be evaluated, some may be new or changing.  The following portions of the chart were reviewed this encounter and updated as appropriate:   Tobacco  Allergies  Meds  Problems  Med Hx  Surg Hx  Fam Hx     Review of Systems:  No other skin or systemic complaints except as noted in HPI or Assessment and Plan.  Objective  Well appearing patient in no apparent distress; mood and affect are within normal limits.  A focused examination was performed including the face, trunk, and extremities. Relevant physical exam findings are noted in the Assessment and Plan.  R lower leg x 1, chest x 1 (2) Erythematous stuck-on, waxy papule or plaque  Scalp Diffuse thinning of the crown and widening of the midline part with retention of the frontal hairline - Reviewed progressive nature and prognosis.   R 4-5th toe webspace Thickened skin.   Assessment & Plan  Inflamed seborrheic keratosis (2) R lower leg x 1, chest x 1 Symptomatic, irritating, patient would like treated. Destruction of lesion - R lower leg x 1, chest x 1 Complexity: simple   Destruction method: cryotherapy   Informed consent: discussed and consent obtained   Timeout:  patient name, date of birth, surgical site, and procedure verified Lesion destroyed using liquid nitrogen: Yes   Region frozen until ice ball extended beyond lesion: Yes   Outcome: patient tolerated procedure well with no complications   Post-procedure details: wound care instructions given    Androgenic alopecia Scalp Chronic and persistent condition with duration or expected duration over one year. Condition is symptomatic / bothersome to patient. Not to goal.  Pt discontinued  minoxidil after 1 month because she did not feel it was working.   Discussed with patient that she should use Minoxidil 2.'5mg'$  1/2 tab po QD for at least six months before seeing peak results.  Restart Minoxidil 2.'5mg'$  1/2 tab po QD. Doses of minoxidil for hair loss are considered 'low dose'. This is because the doses used for hair loss are a lot lower than the doses which are used for conditions such as high blood pressure (hypertension). The doses used for hypertension are 10-'40mg'$  per day.  Side effects are uncommon at the low doses (up to 2.5 mg/day) used to treat hair loss. Potential side effects, more commonly seen at higher doses, include:  Increase in hair growth (hypertrichosis) elsewhere on face and body Temporary hair shedding upon starting medication which may last up to 4 weeks Ankle swelling, fluid retention, rapid weight gain more than 5 pounds Low blood pressure and feeling lightheaded or dizzy when standing up quickly Fast or irregular heartbeat Headaches  Female Androgenic Alopecia is a chronic condition related to genetics and/or hormonal changes.  In women androgenetic alopecia is commonly associated with menopause but may occur any time after puberty.  It causes hair thinning primarily on the crown with widening of the part and temporal hairline recession.  Can use OTC Rogaine (minoxidil) 5% solution/foam as directed.  Oral treatments in female patients who have no contraindication may include : - Low dose oral minoxidil 1.25 - '5mg'$  daily - Spironolactone 50 - '100mg'$  bid - Finasteride 2.5 - 5 mg daily Adjunctive  therapies include: - Low Level Laser Light Therapy (LLLT) - Platelet-rich plasma injections (PRP) - Hair Transplants or scalp reduction  Corns and callosities R 4-5th toe webspace "Soft corn" - benign appearing, recommend following up with podiatrist if bothersome.   Seborrheic Keratoses - Stuck-on, waxy, tan-brown papules and/or plaques  - Benign-appearing -  Discussed benign etiology and prognosis. - Observe - Call for any changes  Actinic Damage - chronic, secondary to cumulative UV radiation exposure/sun exposure over time - diffuse scaly erythematous macules with underlying dyspigmentation - Recommend daily broad spectrum sunscreen SPF 30+ to sun-exposed areas, reapply every 2 hours as needed.  - Recommend staying in the shade or wearing long sleeves, sun glasses (UVA+UVB protection) and wide brim hats (4-inch brim around the entire circumference of the hat). - Call for new or changing lesions.  Lentigines - Scattered tan macules - Due to sun exposure - Benign-appearing, observe - Recommend daily broad spectrum sunscreen SPF 30+ to sun-exposed areas, reapply every 2 hours as needed. - Call for any changes  Return in about 6 months (around 04/14/2023) for alopecia follow up.  Luther Redo, CMA, am acting as scribe for Sarina Ser, MD . Documentation: I have reviewed the above documentation for accuracy and completeness, and I agree with the above.  Sarina Ser, MD

## 2022-10-13 NOTE — Patient Instructions (Signed)
Doses of minoxidil for hair loss are considered 'low dose'. This is because the doses used for hair loss are a lot lower than the doses which are used for conditions such as high blood pressure (hypertension). The doses used for hypertension are 10-40mg per day.  Side effects are uncommon at the low doses (up to 2.5 mg/day) used to treat hair loss. Potential side effects, more commonly seen at higher doses, include: Increase in hair growth (hypertrichosis) elsewhere on face and body Temporary hair shedding upon starting medication which may last up to 4 weeks Ankle swelling, fluid retention, rapid weight gain more than 5 pounds Low blood pressure and feeling lightheaded or dizzy when standing up quickly Fast or irregular heartbeat Headaches      Due to recent changes in healthcare laws, you may see results of your pathology and/or laboratory studies on MyChart before the doctors have had a chance to review them. We understand that in some cases there may be results that are confusing or concerning to you. Please understand that not all results are received at the same time and often the doctors may need to interpret multiple results in order to provide you with the best plan of care or course of treatment. Therefore, we ask that you please give us 2 business days to thoroughly review all your results before contacting the office for clarification. Should we see a critical lab result, you will be contacted sooner.   If You Need Anything After Your Visit  If you have any questions or concerns for your doctor, please call our main line at [phone] and press option 4 to reach your doctor's medical assistant. If no one answers, please leave a voicemail as directed and we will return your call as soon as possible. Messages left after 4 pm will be answered the following business day.   You may also send us a message via MyChart. We typically respond to MyChart messages within 1-2 business  days.  For prescription refills, please ask your pharmacy to contact our office. Our fax number is [phone].  If you have an urgent issue when the clinic is closed that cannot wait until the next business day, you can page your doctor at the number below.    Please note that while we do our best to be available for urgent issues outside of office hours, we are not available 24/7.   If you have an urgent issue and are unable to reach us, you may choose to seek medical care at your doctor's office, retail clinic, urgent care center, or emergency room.  If you have a medical emergency, please immediately call 911 or go to the emergency department.  Pager Numbers  - Dr. Kowalski: [phone]  - Dr. Moye: [phone]  - Dr. Stewart: [phone]  In the event of inclement weather, please call our main line at [phone] for an update on the status of any delays or closures.  Dermatology Medication Tips: Please keep the boxes that topical medications come in in order to help keep track of the instructions about where and how to use these. Pharmacies typically print the medication instructions only on the boxes and not directly on the medication tubes.   If your medication is too expensive, please contact our office at [phone] option 4 or send us a message through MyChart.   We are unable to tell what your co-pay for medications will be in advance as this is different depending on your insurance coverage. However, we   find a substitute medication at lower cost or fill out paperwork to get insurance to cover a needed medication.   If a prior authorization is required to get your medication covered by your insurance company, please allow [place] 1-2 business days to complete this process.  Drug prices often vary depending on where the prescription is filled and some pharmacies may offer cheaper prices.  The website www.goodrx.com contains coupons for medications through different  pharmacies. The prices here do not account for what the cost may be with help from insurance (it may be cheaper with your insurance), but the website can give you the price if you did not use any insurance.  - You can print the associated coupon and take it with your prescription to the pharmacy.  - You may also stop by our office during regular business hours and pick up a GoodRx coupon card.  - If you need your prescription sent electronically to a different pharmacy, notify our office through [facility] or by phone at [phone] option 4.     Si Usted Necesita Algo Despus de Su Visita  Tambin puede enviarnos un mensaje a travs de Pharmacist, community. Por lo general respondemos a los mensajes de MyChart en el transcurso de 1 a 2 das hbiles.  Para renovar recetas, por favor pida a su farmacia que se ponga en contacto con nuestra oficina. Harland Dingwall de fax es [place] [phone].  Si tiene un asunto urgente cuando la clnica est cerrada y que no puede esperar hasta el siguiente da hbil, puede llamar/localizar a su doctor(a) al nmero que aparece a continuacin.   Por favor, tenga en cuenta que aunque hacemos todo lo posible para estar disponibles para asuntos urgentes fuera del horario de [place], no estamos disponibles las 24 horas del da, los 7 das de la [place].   Si tiene un problema urgente y no puede comunicarse con nosotros, puede optar por buscar atencin mdica  en el consultorio de su doctor(a), en una clnica privada, en un centro de atencin urgente o en una sala de emergencias.  Si tiene Engineering geologist, por favor llame inmediatamente al 911 o vaya a la sala de emergencias.  Nmeros de bper  - Dr. Nehemiah Massed: [phone]  - Dra. Moye: [phone]  - Dra. Nicole Kindred: [phone]  En caso de inclemencias del [place], por favor llame a Johnsie Kindred principal al [phone] para una actualizacin sobre el [place] de cualquier retraso o cierre.  Consejos para la  medicacin en dermatologa: Por favor, guarde las cajas en las que vienen los medicamentos de uso tpico para ayudarle a seguir las instrucciones sobre dnde y cmo usarlos. Las farmacias generalmente imprimen las instrucciones del medicamento slo en las cajas y no directamente en los tubos del [place].   Si su medicamento es muy caro, por favor, pngase en contacto con Zigmund Daniel llamando al [phone] y presione la opcin 4 o envenos un mensaje a travs de Pharmacist, community.   No podemos decirle cul ser su copago por los medicamentos por adelantado ya que esto es diferente dependiendo de la cobertura de su seguro. Sin embargo, es posible que podamos encontrar un medicamento sustituto a Electrical engineer un formulario para que el seguro cubra el medicamento que se considera necesario.   Si se requiere una autorizacin previa para que su compaa de seguros [place] su medicamento, por favor permtanos de 1 a 2 das hbiles para completar este proceso.  Los precios de los medicamentos varan con frecuencia dependiendo  del lugar de dnde se surte la receta y alguna farmacias pueden ofrecer precios ms baratos.  El sitio web www.goodrx.com tiene cupones para medicamentos de Airline pilot. Los precios aqu no tienen en cuenta lo que podra costar con la ayuda del seguro (puede ser ms barato con su seguro), pero el sitio web puede darle el precio si no utiliz Research scientist (physical sciences).  - Puede imprimir el cupn correspondiente y llevarlo con su receta a la farmacia.  - Tambin puede pasar por nuestra oficina durante el horario de atencin regular y Charity fundraiser una tarjeta de cupones de GoodRx.  - Si necesita que su receta se enve electrnicamente a una farmacia diferente, informe a nuestra oficina a travs de MyChart de [place] o por telfono llamando al [phone] y presione la opcin 4.

## 2022-10-19 ENCOUNTER — Encounter: Payer: Self-pay | Admitting: Dermatology

## 2022-11-11 DIAGNOSIS — H43812 Vitreous degeneration, left eye: Secondary | ICD-10-CM | POA: Diagnosis not present

## 2022-11-26 ENCOUNTER — Encounter: Payer: Self-pay | Admitting: Internal Medicine

## 2022-11-26 ENCOUNTER — Ambulatory Visit: Payer: No Typology Code available for payment source | Attending: Internal Medicine | Admitting: Internal Medicine

## 2022-11-26 VITALS — BP 110/84 | HR 97 | Ht 63.0 in | Wt 188.0 lb

## 2022-11-26 DIAGNOSIS — M79602 Pain in left arm: Secondary | ICD-10-CM

## 2022-11-26 DIAGNOSIS — I6522 Occlusion and stenosis of left carotid artery: Secondary | ICD-10-CM | POA: Diagnosis not present

## 2022-11-26 DIAGNOSIS — I493 Ventricular premature depolarization: Secondary | ICD-10-CM

## 2022-11-26 DIAGNOSIS — R002 Palpitations: Secondary | ICD-10-CM

## 2022-11-26 DIAGNOSIS — I10 Essential (primary) hypertension: Secondary | ICD-10-CM

## 2022-11-26 DIAGNOSIS — E782 Mixed hyperlipidemia: Secondary | ICD-10-CM
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
## 2022-11-26 NOTE — Patient Instructions (Signed)
Medication Instructions:  Your Physician recommend you continue on your current medication as directed.    *If you need a refill on your cardiac medications before your next appointment, please call your pharmacy*   Lab Work: None ordered today   Testing/Procedures: None ordered today   Follow-Up: At [facility], you and your health needs are our priority.  As part of our continuing mission to provide you with exceptional heart care, we have created designated Provider Care Teams.  These Care Teams include your primary Cardiologist (physician) and Advanced Practice Providers (APPs -  Physician Assistants and Nurse Practitioners) who all work together to provide you with the care you need, when you need it.  We recommend signing up for the patient portal called "MyChart".  Sign up information is provided on this After Visit Summary.  MyChart is used to connect with patients for Virtual Visits (Telemedicine).  Patients are able to view lab/test results, encounter notes, upcoming appointments, etc.  Non-urgent messages can be sent to your provider as well.   To learn more about what you can do with MyChart, go to [email or web address].    Your next appointment:   3 month(s)  The format for your next appointment:   In Person  Provider:   You will see one of the following Advanced Practice Providers on your designated Care Team:   Murray Hodgkins, NP Christell Faith, PA-C Cadence Kathlen Mody, PA-C Gerrie Nordmann, NP      Your physician recommends that you schedule a follow-up appointment with primary care for neck and shoulder pain.

## 2022-11-27 ENCOUNTER — Encounter: Payer: Self-pay | Admitting: Internal Medicine

## 2022-11-27 DIAGNOSIS — M79602 Pain in left arm: Secondary | ICD-10-CM | POA: Insufficient documentation

## 2022-11-27 DIAGNOSIS — E782 Mixed hyperlipidemia: Secondary | ICD-10-CM | POA: Insufficient documentation

## 2022-11-27 DIAGNOSIS — I6522 Occlusion and stenosis of left carotid artery: Secondary | ICD-10-CM | POA: Insufficient documentation

## 2022-12-10 DIAGNOSIS — Z0100 Encounter for examination of eyes and vision without abnormal findings: Secondary | ICD-10-CM | POA: Diagnosis not present

## 2022-12-17 ENCOUNTER — Other Ambulatory Visit: Payer: Self-pay | Admitting: Dermatology

## 2022-12-17 DIAGNOSIS — L659 Nonscarring hair loss, unspecified: Secondary | ICD-10-CM

## 2023-01-26 ENCOUNTER — Emergency Department
Admission: EM | Admit: 2023-01-26 | Discharge: 2023-01-26 | Disposition: A | Discharge status: Home / Self Care | Payer: Medicare HMO | Attending: Emergency Medicine | Admitting: Emergency Medicine

## 2023-01-26 ENCOUNTER — Other Ambulatory Visit: Payer: Self-pay

## 2023-01-26 ENCOUNTER — Emergency Department: Payer: Medicare HMO

## 2023-01-26 DIAGNOSIS — I10 Essential (primary) hypertension: Secondary | ICD-10-CM | POA: Diagnosis not present

## 2023-01-26 DIAGNOSIS — G319 Degenerative disease of nervous system, unspecified: Secondary | ICD-10-CM | POA: Diagnosis not present

## 2023-01-26 DIAGNOSIS — G510 Bell's palsy: Secondary | ICD-10-CM

## 2023-01-26 DIAGNOSIS — R29818 Other symptoms and signs involving the nervous system: Secondary | ICD-10-CM | POA: Diagnosis not present

## 2023-01-26 DIAGNOSIS — R29810 Facial weakness: Secondary | ICD-10-CM | POA: Diagnosis present

## 2023-01-26 DIAGNOSIS — I6782 Cerebral ischemia: Secondary | ICD-10-CM | POA: Diagnosis not present

## 2023-01-26 LAB — BASIC METABOLIC PANEL
Anion gap: 12 (ref 5–15)
BUN: 21 mg/dL (ref 8–23)
CO2: 20 mmol/L — ABNORMAL LOW (ref 22–32)
Calcium: 9.2 mg/dL (ref 8.9–10.3)
Chloride: 105 mmol/L (ref 98–111)
Creatinine, Ser: 1.45 mg/dL — ABNORMAL HIGH (ref 0.44–1.00)
GFR, Estimated: 37 mL/min — ABNORMAL LOW (ref >60)
Glucose, Bld: 92 mg/dL (ref 70–99)
Potassium: 4 mmol/L (ref 3.5–5.1)
Sodium: 137 mmol/L (ref 135–145)

## 2023-01-26 LAB — CBC
HCT: 37.1 % (ref 36.0–46.0)
Hemoglobin: 12 g/dL (ref 12.0–15.0)
MCH: 30.8 pg (ref 26.0–34.0)
MCHC: 32.3 g/dL (ref 30.0–36.0)
MCV: 95.4 fL (ref 80.0–100.0)
Platelets: 284 10*3/uL (ref 150–400)
RBC: 3.89 MIL/uL (ref 3.87–5.11)
RDW: 13.6 % (ref 11.5–15.5)
WBC: 8.3 10*3/uL (ref 4.0–10.5)
nRBC: 0 % (ref 0.0–0.2)

## 2023-01-26 MED ORDER — VALACYCLOVIR HCL 1 G PO TABS: 1000.0000 mg | ORAL_TABLET | Freq: Three times a day (TID) | ORAL | 0 refills | Status: AC

## 2023-01-26 MED ORDER — ARTIFICIAL TEARS OPHTHALMIC OINT: TOPICAL_OINTMENT | OPHTHALMIC | 1 refills | Status: DC | PRN

## 2023-01-26 MED ORDER — ARTIFICIAL TEARS OPHTHALMIC OINT: TOPICAL_OINTMENT | OPHTHALMIC | 1 refills | Status: AC | PRN

## 2023-01-26 MED ORDER — PREDNISONE 10 MG (21) PO TBPK: ORAL_TABLET | ORAL | 0 refills | Status: DC

## 2023-01-26 NOTE — ED Notes (Signed)
See triage note  Presents with some left sided facial numbness  States this started on Friday  Speech is clear  Right sided facial movement is less

## 2023-01-26 NOTE — ED Triage Notes (Signed)
Pt states she has had right sided facial palsy since Friday. Pt has less facial movement on the right side- states she can only spit when brushing teeth from the left. No other deficits noted. Pt endorses some pain in her face as well.  Equal strength bilaterally in both upper and lower limbs.

## 2023-01-26 NOTE — ED Triage Notes (Signed)
C/o right sided facial numbness and paralysis since Friday.  Presents AAOx3.  Skin warm and dry.  MAE equally and strong.  Right facial movement less than left.  Speech clear. No numbness noted.

## 2023-01-26 NOTE — ED Provider Notes (Signed)
[facility] Provider Note    Event Date/Time   First MD Initiated Contact with Patient 01/26/23 1812     (approximate)   History   Facial Palsy   HPI  Abigail Moody is a 77 y.o. female with history of hypertension, seizures, hyperlipidemia, headaches presents emergency department with right-sided facial palsy.  Patient states symptoms started on Friday.  States having eye pain Ert feels really dry and is sensitive to light.  No known injury.  Noticed today that she was trying to brush her teeth in the right side of her mouth will not let her .7/2 spit out the left side.  No numbness or tingling.  No severe headache.  Patient does take [place] for seizures.      Physical Exam   Triage Vital Signs: ED Triage Vitals  Enc Vitals Group     BP 01/26/23 1720 (!) 122/96     Pulse Rate 01/26/23 1720 69     Resp 01/26/23 1720 16     Temp 01/26/23 1720 98.6 F (37 C)     Temp Source 01/26/23 1720 Oral     SpO2 01/26/23 1720 96 %     Weight 01/26/23 1721 177 lb (80.3 kg)     Height 01/26/23 1721 '5\' 3"'$  (1.6 m)     Head Circumference --      Peak Flow --      Pain Score 01/26/23 1720 3     Pain Loc --      Pain Edu? --      Excl. in [place]? --     Most recent vital signs: Vitals:   01/26/23 1720  BP: (!) 122/96  Pulse: 69  Resp: 16  Temp: 98.6 F (37 C)  SpO2: 96%     General: Awake, no distress.   CV:  Good peripheral perfusion. regular rate and  rhythm Resp:  Normal effort. Lungs cta Abd:  No distention.   Other:  Facial palsy noted on the right, flex the right side of her lips and cheek along with the right eye and eyebrow, very typical of Bell's palsy   ED Results / Procedures / Treatments   Labs (all labs ordered are listed, but only abnormal results are displayed) Labs Reviewed  BASIC METABOLIC PANEL - Abnormal; Notable for the following components:      Result Value   CO2 20 (*)    Creatinine, Ser 1.45 (*)    GFR, Estimated 37  (*)    All other components within normal limits  CBC     EKG     RADIOLOGY CT of the head    PROCEDURES:   Procedures   MEDICATIONS ORDERED IN ED: Medications - No data to display   IMPRESSION / MDM / [place] / ED COURSE  I reviewed the triage vital signs and the nursing notes.                              Differential diagnosis includes, but is not limited to, CVA, Bell's palsy, subdural subarachnoid  Patient's presentation is most consistent with acute presentation with potential threat to life or bodily function.   CT of the head to rule out CVA  Feel the patient most likely has Bell's palsy.  Will confirm with CT   CT of the head independently reviewed and interpreted by me as being negative for any acute abnormality.  Confirmed  by radiology  I did explain everything to the patient.  Due to her having Bell's palsy she will need to use a eye ointment, Lacri-Lube, and her eye throughout the day and night.  She is to take to the eyelid down at night.  She was given paper tape and silk tape here in the ED.  She is also to take valacyclovir 1000 mg 3 times daily and start Sterapred tomorrow morning.  She is in agreement with treatment plan.  Do not feel that she would benefit from an admission as this can easily be treated as an outpatient.  She was discharged stable condition.   FINAL CLINICAL IMPRESSION(S) / ED DIAGNOSES   Final diagnoses:  Bell's palsy     Rx / DC Orders   ED Discharge Orders          Ordered    predniSONE (STERAPRED UNI-PAK 21 TAB) 10 MG (21) TBPK tablet        01/26/23 1917    valACYclovir (VALTREX) 1000 MG tablet  3 times daily        01/26/23 1917    artificial tears (LACRILUBE) OINT ophthalmic ointment  Every 4 hours PRN,   Status:  Discontinued        01/26/23 1917    artificial tears (LACRILUBE) OINT ophthalmic ointment  Every 4 hours PRN        01/26/23 1918             Note:  This document was prepared  using Dragon voice recognition software and may include unintentional dictation errors.    Versie Starks, PA-C 01/26/23 Urbano Heir, MD 01/26/23 2157

## 2023-01-26 NOTE — Discharge Instructions (Signed)
Use the Lacri-Lube during the day for 5 dryness.  Apply a 1/4 inch in your eye at night and take the eyelid down to protect the cornea from drying out. Take the valacyclovir as prescribed Start steroid tomorrow Return emergency department worsening Follow-up with your regular doctor if you feel that you are not improving in 4 to 5 days.

## 2023-01-26 NOTE — ED Notes (Signed)
E signature pad not working. Pt educated on discharge instructions and verbalized understanding.  

## 2023-02-02 DIAGNOSIS — G510 Bell's palsy: Secondary | ICD-10-CM | POA: Diagnosis not present

## 2023-02-02 DIAGNOSIS — Z09 Encounter for follow-up examination after completed treatment for conditions other than malignant neoplasm: Secondary | ICD-10-CM | POA: Diagnosis not present

## 2023-03-02 DIAGNOSIS — N1831 Chronic kidney disease, stage 3a: Secondary | ICD-10-CM | POA: Diagnosis not present

## 2023-03-02 DIAGNOSIS — I779 Disorder of arteries and arterioles, unspecified: Secondary | ICD-10-CM | POA: Diagnosis not present

## 2023-03-02 DIAGNOSIS — I10 Essential (primary) hypertension: Secondary | ICD-10-CM | POA: Diagnosis not present

## 2023-03-02 DIAGNOSIS — E7849 Other hyperlipidemia: Secondary | ICD-10-CM | POA: Diagnosis not present

## 2023-03-02 DIAGNOSIS — R569 Unspecified convulsions: Secondary | ICD-10-CM | POA: Diagnosis not present

## 2023-03-04 ENCOUNTER — Encounter: Payer: Self-pay | Admitting: Internal Medicine

## 2023-03-04 ENCOUNTER — Ambulatory Visit: Payer: Medicare HMO | Attending: Internal Medicine | Admitting: Internal Medicine

## 2023-03-04 VITALS — BP 120/78 | HR 67 | Ht 63.0 in | Wt 174.0 lb

## 2023-03-04 DIAGNOSIS — E782 Mixed hyperlipidemia: Secondary | ICD-10-CM

## 2023-03-04 DIAGNOSIS — I6522 Occlusion and stenosis of left carotid artery: Secondary | ICD-10-CM | POA: Diagnosis not present

## 2023-03-04 DIAGNOSIS — R002 Palpitations: Secondary | ICD-10-CM

## 2023-03-04 DIAGNOSIS — I10 Essential (primary) hypertension: Secondary | ICD-10-CM | POA: Diagnosis not present

## 2023-03-04 DIAGNOSIS — M79602 Pain in left arm: Secondary | ICD-10-CM | POA: Diagnosis not present

## 2023-03-04 NOTE — Progress Notes (Unsigned)
Follow-up Outpatient Visit Date: 03/04/2023  Primary Care Provider: Adin Hector, MD [place] [id number]  Chief Complaint: Follow-up palpitations  HPI:  Abigail Moody is a 77 y.o. female with history of carotid artery stenosis, hypertension, hyperlipidemia, obstructive sleep apnea, reactive airway disease, chronic kidney disease, seizure disorder with mild cognitive impairment, arthritis, acid reflux, and anxiety/depression, who presents for follow-up of palpitations.  I last saw her in December, at which time she reported not doing well due to a number of stressors in her life.  She developed severe left arm pain while undergoing a dental procedure, leading to EMS being called.  She was not taken to the ED.  She continued to complain of some left upper arm pain with abduction or pressure along the lateral aspect of the arm.  Pain was felt to be noncardiac.  No further testing was pursued.  She was seen in the ED last month with right-sided facial weakness and diagnosed with Bell's palsy.  Today, Abigail Moody reports that she is feeling fairly well though she still has some soreness of her left arm/shoulder.  It feels similar to a previous rotator cuff problem that she had a few years ago.  She denies chest pain, shortness of breath,  lightheadedness, and edema.  She notices sporadic palpitations lasting only few seconds at a time that seem to be associated with stress.  These are no worse than at prior visits.  Facial weakness associated with recent Bell's palsy has resolved.  --------------------------------------------------------------------------------------------------  Cardiovascular History & Procedures: Cardiovascular Problems: Palpitations Left arm pain   Risk Factors: Carotid artery stenosis, hypertension, hyperlipidemia, obesity, and age greater than 72   Cath/PCI: None   CV Surgery: None   EP Procedures and Devices: Recent  Holter monitor reportedly showing occasional ventricular ectopy.  Slow atrial fibrillation could not be excluded.  Tracings not available for review.   Non-Invasive Evaluation(s): TTE (05/08/2022): Normal LV size with mild LVH.  LVEF 55-60% with grade 1 diastolic dysfunction.  Normal RV size and function.  Normal biatrial size.  Trivial mitral and tricuspid regurgitation.   Normal CVP. TTE (04/20/2017, [facility]): Normal LV size with mild LVH.  LVEF greater than 55%.  Mild mitral and tricuspid regurgitation.  Trivial aortic regurgitation.  Normal RV size and function.  Recent CV Pertinent Labs: Lab Results  Component Value Date   CHOL 144 08/07/2016   HDL 45 08/07/2016   LDLCALC 70 08/07/2016   TRIG 146 08/07/2016   CHOLHDL 3.2 08/07/2016   INR 0.92 08/06/2016   K 4.0 01/26/2023   K 4.0 01/27/2012   BUN 21 01/26/2023   BUN 15 01/27/2012   CREATININE 1.45 (H) 01/26/2023   CREATININE 0.90 01/27/2012    Past medical and surgical history were reviewed and updated in EPIC.  Current Meds  Medication Sig   albuterol (PROAIR HFA) 108 (90 Base) MCG/ACT inhaler Inhale 2 puffs into the lungs as needed.   amLODipine (NORVASC) 10 MG tablet Take 10 mg by mouth daily.   artificial tears (LACRILUBE) OINT ophthalmic ointment Place into the right eye every 4 (four) hours as needed for dry eyes.   aspirin 81 MG EC tablet Take 81 mg by mouth daily.   atorvastatin (LIPITOR) 10 MG tablet Take 10 mg by mouth daily.   Biotin 10 MG TABS Take 1 tablet by mouth daily.   Cholecalciferol 50 MCG (2000 UT) TABS Take by mouth daily.   cyanocobalamin 1000  MCG tablet Take 1,000 mcg by mouth daily.   escitalopram (LEXAPRO) 10 MG tablet Take 10 mg by mouth daily.   ferrous sulfate 325 (65 FE) MG tablet Take 325 mg by mouth daily with breakfast.    fexofenadine (ALLEGRA) 180 MG tablet Take 180 mg by mouth daily.   fluticasone (FLONASE) 50 MCG/ACT nasal spray Place 2 sprays into both nostrils daily.    levETIRAcetam (KEPPRA) 500 MG tablet Take 500 mg by mouth 2 (two) times daily.   Meloxicam 10 MG CAPS Take 1 capsule by mouth at bedtime as needed.   minoxidil (LONITEN) 2.5 MG tablet Take 1/2 tab po QD.   Multiple Vitamins-Minerals (MULTIVITAMIN WITH MINERALS) tablet Take 1 tablet by mouth daily.   omeprazole (PRILOSEC) 20 MG capsule Take 20 mg by mouth daily.    Allergies: Erythromycin, Latex, Levaquin [levofloxacin], and Nickel  Social History   Tobacco Use   Smoking status: Never   Smokeless tobacco: Never  Vaping Use   Vaping Use: Never used  Substance Use Topics   Alcohol use: Not Currently   Drug use: No    Family History  Problem Relation Age of Onset   Heart attack Mother    Cancer Father        prostate   Heart attack Sister 14   Cancer Brother        half brother/lung   Breast cancer Other     Review of Systems: A 12-system review of systems was performed and was negative except as noted in the HPI.  --------------------------------------------------------------------------------------------------  Physical Exam: BP 120/78 (BP Location: Left Arm, Patient Position: Sitting, Cuff Size: Large)   Pulse 67   Ht '5\' 3"'$  (1.6 m)   Wt 174 lb (78.9 kg)   SpO2 98%   BMI 30.82 kg/m   General:  NAD. Neck: No JVD or HJR. Lungs: Clear to auscultation bilaterally without wheezes or crackles. Heart: Regular rate and rhythm without murmurs, rubs, or gallops. Abdomen: Soft, nontender, nondistended. Extremities: No lower extremity edema.  Lab Results  Component Value Date   WBC 8.3 01/26/2023   HGB 12.0 01/26/2023   HCT 37.1 01/26/2023   MCV 95.4 01/26/2023   PLT 284 01/26/2023    Lab Results  Component Value Date   NA 137 01/26/2023   K 4.0 01/26/2023   CL 105 01/26/2023   CO2 20 (L) 01/26/2023   BUN 21 01/26/2023   CREATININE 1.45 (H) 01/26/2023   GLUCOSE 92 01/26/2023   ALT 17 08/06/2016    Lab Results  Component Value Date   CHOL 144 08/07/2016    HDL 45 08/07/2016   LDLCALC 70 08/07/2016   TRIG 146 08/07/2016   CHOLHDL 3.2 08/07/2016    --------------------------------------------------------------------------------------------------  ASSESSMENT AND PLAN: Palpitations: Sporadic palpitations are stable and without associated symptoms.  No further workup or intervention needed at this time.  Left arm pain: Most likely musculoskeletal and not consistent with atypical angina.  I encouraged Ms. Shurtz to speak with her PCP at their upcoming visit about this and the need for orthopedics consultations.  Hypertension: BP well-controlled today.  No medication changes at this time.  Carotid artery stenosis and hyperlipidemia: Mild carotid disease noted on prior Dopplers.  Continue aspirin and atorvastatin; could consider escalation of atorvastatin given recent LDL of 71.  I will defer ongoing management and follow-up to Dr. Caryl Comes.  Follow-up: Return to clinic in 1 year.  Nelva Bush, MD 03/04/2023 2:56 PM

## 2023-03-04 NOTE — Patient Instructions (Signed)
Medication Instructions:  No changes  *If you need a refill on your cardiac medications before your next appointment, please call your pharmacy*   Lab Work: None If you have labs (blood work) drawn today and your tests are completely normal, you will receive your results only by: [place] (if you have MyChart) OR A paper copy in the mail If you have any lab test that is abnormal or we need to change your treatment, we will call you to review the results.   Testing/Procedures: None   Follow-Up: At [facility], you and your health needs are our priority.  As part of our continuing mission to provide you with exceptional heart care, we have created designated Provider Care Teams.  These Care Teams include your primary Cardiologist (physician) and Advanced Practice Providers (APPs -  Physician Assistants and Nurse Practitioners) who all work together to provide you with the care you need, when you need it.  We recommend signing up for the patient portal called "MyChart".  Sign up information is provided on this After Visit Summary.  MyChart is used to connect with patients for Virtual Visits (Telemedicine).  Patients are able to view lab/test results, encounter notes, upcoming appointments, etc.  Non-urgent messages can be sent to your provider as well.   To learn more about what you can do with MyChart, go to [email or web address].    Your next appointment:   1 year(s)  Provider:   You may see Nelva Bush, MD or one of the following Advanced Practice Providers on your designated Care Team:   Murray Hodgkins, NP Christell Faith, PA-C Cadence Kathlen Mody, PA-C Gerrie Nordmann, NP  Other Instructions None

## 2023-03-05 ENCOUNTER — Encounter: Payer: Self-pay | Admitting: Internal Medicine

## 2023-03-09 DIAGNOSIS — F32A Depression, unspecified: Secondary | ICD-10-CM | POA: Diagnosis not present

## 2023-03-09 DIAGNOSIS — K219 Gastro-esophageal reflux disease without esophagitis: Secondary | ICD-10-CM | POA: Diagnosis not present

## 2023-03-09 DIAGNOSIS — I779 Disorder of arteries and arterioles, unspecified: Secondary | ICD-10-CM | POA: Diagnosis not present

## 2023-03-09 DIAGNOSIS — E559 Vitamin D deficiency, unspecified: Secondary | ICD-10-CM | POA: Diagnosis not present

## 2023-03-09 DIAGNOSIS — I129 Hypertensive chronic kidney disease with stage 1 through stage 4 chronic kidney disease, or unspecified chronic kidney disease: Secondary | ICD-10-CM | POA: Diagnosis not present

## 2023-03-09 DIAGNOSIS — F419 Anxiety disorder, unspecified: Secondary | ICD-10-CM | POA: Diagnosis not present

## 2023-03-09 DIAGNOSIS — R569 Unspecified convulsions: Secondary | ICD-10-CM | POA: Diagnosis not present

## 2023-03-09 DIAGNOSIS — N183 Chronic kidney disease, stage 3 unspecified: Secondary | ICD-10-CM | POA: Diagnosis not present

## 2023-04-22 ENCOUNTER — Ambulatory Visit: Payer: Medicare HMO | Admitting: Dermatology

## 2023-04-22 VITALS — BP 138/85 | HR 63

## 2023-04-22 DIAGNOSIS — L821 Other seborrheic keratosis: Secondary | ICD-10-CM

## 2023-04-22 DIAGNOSIS — L820 Inflamed seborrheic keratosis: Secondary | ICD-10-CM | POA: Diagnosis not present

## 2023-04-22 DIAGNOSIS — Z79899 Other long term (current) drug therapy: Secondary | ICD-10-CM

## 2023-04-22 DIAGNOSIS — L649 Androgenic alopecia, unspecified: Secondary | ICD-10-CM | POA: Diagnosis not present

## 2023-04-22 DIAGNOSIS — L578 Other skin changes due to chronic exposure to nonionizing radiation: Secondary | ICD-10-CM

## 2023-04-22 DIAGNOSIS — L814 Other melanin hyperpigmentation: Secondary | ICD-10-CM | POA: Diagnosis not present

## 2023-04-22 DIAGNOSIS — Z7189 Other specified counseling: Secondary | ICD-10-CM | POA: Diagnosis not present

## 2023-04-22 DIAGNOSIS — L659 Nonscarring hair loss, unspecified: Secondary | ICD-10-CM

## 2023-04-22 MED ORDER — MINOXIDIL 2.5 MG PO TABS: ORAL_TABLET | ORAL | 2 refills | Status: DC

## 2023-04-22 NOTE — Patient Instructions (Addendum)
Seborrheic Keratosis  What causes seborrheic keratoses? Seborrheic keratoses are harmless, common skin growths that first appear during adult life.  As time goes by, more growths appear.  Some people may develop a large number of them.  Seborrheic keratoses appear on both covered and uncovered body parts.  They are not caused by sunlight.  The tendency to develop seborrheic keratoses can be inherited.  They vary in color from skin-colored to gray, brown, or even black.  They can be either smooth or have a rough, warty surface.   Seborrheic keratoses are superficial and look as if they were stuck on the skin.  Under the microscope this type of keratosis looks like layers upon layers of skin.  That is why at times the top layer may seem to fall off, but the rest of the growth remains and re-grows.    Treatment Seborrheic keratoses do not need to be treated, but can easily be removed in the office.  Seborrheic keratoses often cause symptoms when they rub on clothing or jewelry.  Lesions can be in the way of shaving.  If they become inflamed, they can cause itching, soreness, or burning.  Removal of a seborrheic keratosis can be accomplished by freezing, burning, or surgery. If any spot bleeds, scabs, or grows rapidly, please return to have it checked, as these can be an indication of a skin cancer.   Cryotherapy Aftercare  Wash gently with soap and water everyday.   Apply Vaseline and Band-Aid daily until healed.      Restart Minoxidil 2.5mg  1/2 tab by mouth daily  Doses of minoxidil for hair loss are considered 'low dose'. This is because the doses used for hair loss are much lower than the doses which are used for conditions such as high blood pressure (hypertension). The doses used for hypertension are 10-40mg  per day.  Side effects are uncommon at the low doses (up to 2.5 mg/day) used to treat hair loss. Potential side effects, more commonly seen at higher doses, include: Increase in hair  growth (hypertrichosis) elsewhere on face and body Temporary hair shedding upon starting medication which may last up to 4 weeks Ankle swelling, fluid retention, rapid weight gain more than 5 pounds Low blood pressure and feeling lightheaded or dizzy when standing up quickly Fast or irregular heartbeat Headaches   Due to recent changes in healthcare laws, you may see results of your pathology and/or laboratory studies on MyChart before the doctors have had a chance to review them. We understand that in some cases there may be results that are confusing or concerning to you. Please understand that not all results are received at the same time and often the doctors may need to interpret multiple results in order to provide you with the best plan of care or course of treatment. Therefore, we ask that you please give [place] 2 business days to thoroughly review all your results before contacting the office for clarification. Should we see a critical lab result, you will be contacted sooner.   If You Need Anything After Your Visit  If you have any questions or concerns for your doctor, please call our main line at [phone] and press option 4 to reach your doctor's medical assistant. If no one answers, please leave a voicemail as directed and we will return your call as soon as possible. Messages left after 4 pm will be answered the following business day.   You may also send [place] a message via MyChart. We typically respond  to MyChart messages within 1-2 business days.  For prescription refills, please ask your pharmacy to contact our office. Our fax number is [phone].  If you have an urgent issue when the clinic is closed that cannot wait until the next business day, you can page your doctor at the number below.    Please note that while we do our best to be available for urgent issues outside of office hours, we are not available 24/7.   If you have an urgent issue and are unable to reach [place],  you may choose to seek medical care at your doctor's office, retail clinic, urgent care center, or emergency room.  If you have a medical emergency, please immediately call 911 or go to the emergency department.  Pager Numbers  - Dr. Gwen Pounds: [phone]  - Dr. Neale Burly: [phone]  - Dr. Roseanne Reno: [phone]  In the event of inclement weather, please call our main line at [phone] for an update on the status of any delays or closures.  Dermatology Medication Tips: Please keep the boxes that topical medications come in in order to help keep track of the instructions about where and how to use these. Pharmacies typically print the medication instructions only on the boxes and not directly on the medication tubes.   If your medication is too expensive, please contact our office at [phone] option 4 or send [place] a message through MyChart.   We are unable to tell what your co-pay for medications will be in advance as this is different depending on your insurance coverage. However, we may be able to find a substitute medication at lower cost or fill out paperwork to get insurance to cover a needed medication.   If a prior authorization is required to get your medication covered by your insurance company, please allow [place] 1-2 business days to complete this process.  Drug prices often vary depending on where the prescription is filled and some pharmacies may offer cheaper prices.  The website www.goodrx.com contains coupons for medications through different pharmacies. The prices here do not account for what the cost may be with help from insurance (it may be cheaper with your insurance), but the website can give you the price if you did not use any insurance.  - You can print the associated coupon and take it with your prescription to the pharmacy.  - You may also stop by our office during regular business hours and pick up a GoodRx coupon card.  - If you need your prescription sent  electronically to a different pharmacy, notify our office through [facility] or by phone at [phone] option 4.     Si Usted Necesita Algo Despus de Su Visita  Tambin puede enviarnos un mensaje a travs de Clinical cytogeneticist. Por lo general respondemos a los mensajes de MyChart en el transcurso de 1 a 2 das hbiles.  Para renovar recetas, por favor pida a su farmacia que se ponga en contacto con nuestra oficina. Annie Sable de fax es [place] [phone].  Si tiene un asunto urgente cuando la clnica est cerrada y que no puede esperar hasta el siguiente da hbil, puede llamar/localizar a su doctor(a) al nmero que aparece a continuacin.   Por favor, tenga en cuenta que aunque hacemos todo lo posible para estar disponibles para asuntos urgentes fuera del horario de [place], no estamos disponibles las 24 horas del da, los 7 [address]  [address] de la [place].   Si tiene un problema urgente y no puede comunicarse con nosotros,  puede optar por buscar atencin mdica  en el consultorio de su doctor(a), en una clnica privada, en un centro de atencin urgente o en una sala de emergencias.  Si tiene Engineer, drilling, por favor llame inmediatamente al 911 o vaya a la sala de emergencias.  Nmeros de bper  - Dr. Gwen Pounds: [phone]  - Dra. Moye: [phone]  - Dra. Roseanne Reno: [phone]  En caso de inclemencias del [place], por favor llame a Lacy Duverney principal al [phone] para una actualizacin sobre el [place] de cualquier retraso o cierre.  Consejos para la medicacin en dermatologa: Por favor, guarde las cajas en las que vienen los medicamentos de uso tpico para ayudarle a seguir las instrucciones sobre dnde y cmo usarlos. Las farmacias generalmente imprimen las instrucciones del medicamento slo en las cajas y no directamente en los tubos del [place].   Si su medicamento es muy caro, por favor, pngase en contacto con Rolm [place]ala llamando al [phone] y presione la  opcin 4 o envenos un mensaje a travs de Clinical cytogeneticist.   No podemos decirle cul ser su copago por los medicamentos por adelantado ya que esto es diferente dependiendo de la cobertura de su seguro. Sin embargo, es posible que podamos encontrar un medicamento sustituto a Audiological scientist un formulario para que el seguro cubra el medicamento que se considera necesario.   Si se requiere una autorizacin previa para que su compaa de seguros [place] su medicamento, por favor permtanos de 1 a 2 das hbiles para completar [address]  Los precios de los medicamentos varan con frecuencia dependiendo del Environmental consultant de dnde se surte la receta y alguna farmacias pueden ofrecer precios ms baratos.  El sitio web www.goodrx.com tiene cupones para medicamentos de Health and safety inspector. Los precios aqu no tienen en cuenta lo que podra costar con la ayuda del seguro (puede ser ms barato con su seguro), pero el sitio web puede darle el precio si no utiliz Tourist information centre manager.  - Puede imprimir el cupn correspondiente y llevarlo con su receta a la farmacia.  - Tambin puede pasar por nuestra oficina durante el horario de atencin regular y Education officer, museum una tarjeta de cupones de GoodRx.  - Si necesita que su receta se enve electrnicamente a una farmacia diferente, informe a nuestra oficina a travs de MyChart de [place] o por telfono llamando al [phone] y presione la opcin 4.

## 2023-04-22 NOTE — Progress Notes (Signed)
Follow-Up Visit   Subjective  Abigail Moody is a 77 y.o. female who presents for the following: patient here for 6 month follow up on isks and androgenetic alopecia. Patient reports some spots at  b/l arms and spot at left chest she would like checked.   The following portions of the chart were reviewed this encounter and updated as appropriate: medications, allergies, medical history  Review of Systems:  No other skin or systemic complaints except as noted in HPI or Assessment and Plan.  Objective  Well appearing patient in no apparent distress; mood and affect are within normal limits. A focused examination was performed of the following areas: Chest, back, arms, and scalp  Relevant exam findings are noted in the Assessment and Plan.  left chest x 1, right forearm x 1, right lower back x 1 , back x 13 (16) Erythematous stuck-on, waxy papule or plaque   Assessment & Plan   Androgenic alopecia Scalp Chronic and persistent condition with duration or expected duration over one year. Condition is symptomatic / bothersome to patient. Not to goal.  Discussed with patient that she should use Minoxidil 2.5mg  1/2 tab po QD for at least six months before seeing peak results.   Restart Minoxidil 2.5mg  1/2 tab po QD. 45 tabs 2 rfs Doses of minoxidil for hair loss are considered 'low dose'. This is because the doses used for hair loss are a lot lower than the doses which are used for conditions such as high blood pressure (hypertension). The doses used for hypertension are 10-40mg  per day.  Side effects are uncommon at the low doses (up to 2.5 mg/day) used to treat hair loss. Potential side effects, more commonly seen at higher doses, include:   Increase in hair growth (hypertrichosis) elsewhere on face and body Temporary hair shedding upon starting medication which may last up to 4 weeks Ankle swelling, fluid retention, rapid weight gain more than 5 pounds Low blood pressure and feeling  lightheaded or dizzy when standing up quickly Fast or irregular heartbeat Headaches   Female Androgenic Alopecia is a chronic condition related to genetics and/or hormonal changes.  In women androgenetic alopecia is commonly associated with menopause but may occur any time after puberty.  It causes hair thinning primarily on the crown with widening of the part and temporal hairline recession.  Can use OTC Rogaine (minoxidil) 5% solution/foam as directed.  Oral treatments in female patients who have no contraindication may include : - Low dose oral minoxidil 1.25 - 5mg  daily - Spironolactone 50 - 100mg  bid - Finasteride 2.5 - 5 mg daily Adjunctive therapies include: - Low Level Laser Light Therapy (LLLT) - Platelet-rich plasma injections (PRP) - Hair Transplants or scalp reduction Long term medication management.  Patient is using long term (months to years) prescription medication  to control their dermatologic condition.  These medications require periodic monitoring to evaluate for efficacy and side effects and may require periodic laboratory monitoring.  SEBORRHEIC KERATOSIS - Stuck-on, waxy, tan-brown papules and/or plaques  - Benign-appearing - Discussed benign etiology and prognosis. - Observe - Call for any changes  LENTIGINES Exam: scattered tan macules Due to sun exposure Treatment Plan: Benign-appearing, observe. Recommend daily broad spectrum sunscreen SPF 30+ to sun-exposed areas, reapply every 2 hours as needed.  Call for any changes  ACTINIC DAMAGE - chronic, secondary to cumulative UV radiation exposure/sun exposure over time - diffuse scaly erythematous macules with underlying dyspigmentation - Recommend daily broad spectrum sunscreen SPF 30+ to  sun-exposed areas, reapply every 2 hours as needed.  - Recommend staying in the shade or wearing long sleeves, sun glasses (UVA+UVB protection) and wide brim hats (4-inch brim around the entire circumference of the hat). -  Call for new or changing lesions.  Inflamed seborrheic keratosis (16) left chest x 1, right forearm x 1, right lower back x 1 , back x 13  Symptomatic, irritating, patient would like treated.  Destruction of lesion - left chest x 1, right forearm x 1, right lower back x 1 , back x 13 Complexity: simple   Destruction method: cryotherapy   Informed consent: discussed and consent obtained   Timeout:  patient name, date of birth, surgical site, and procedure verified Lesion destroyed using liquid nitrogen: Yes   Region frozen until ice ball extended beyond lesion: Yes   Outcome: patient tolerated procedure well with no complications   Post-procedure details: wound care instructions given    Alopecia  Related Medications minoxidil (LONITEN) 2.5 MG tablet Take 1/2 tab po QD.  Medication management  Actinic skin damage  Lentigo   Return in about 6 months (around 10/23/2023) for isk and alopecia .  IAsher Muir, CMA, am acting as scribe for Armida Sans, MD.  Documentation: I have reviewed the above documentation for accuracy and completeness, and I agree with the above.  Armida Sans, MD

## 2023-04-28 ENCOUNTER — Encounter: Payer: Self-pay | Admitting: Dermatology

## 2023-08-18 ENCOUNTER — Other Ambulatory Visit: Payer: Self-pay | Admitting: Internal Medicine

## 2023-08-18 DIAGNOSIS — Z1231 Encounter for screening mammogram for malignant neoplasm of breast: Secondary | ICD-10-CM

## 2023-09-08 DIAGNOSIS — I10 Essential (primary) hypertension: Secondary | ICD-10-CM | POA: Diagnosis not present

## 2023-09-08 DIAGNOSIS — E7849 Other hyperlipidemia: Secondary | ICD-10-CM | POA: Diagnosis not present

## 2023-09-08 DIAGNOSIS — N183 Chronic kidney disease, stage 3 unspecified: Secondary | ICD-10-CM | POA: Diagnosis not present

## 2023-09-08 DIAGNOSIS — E559 Vitamin D deficiency, unspecified: Secondary | ICD-10-CM | POA: Diagnosis not present

## 2023-09-15 DIAGNOSIS — I779 Disorder of arteries and arterioles, unspecified: Secondary | ICD-10-CM | POA: Diagnosis not present

## 2023-09-15 DIAGNOSIS — Z23 Encounter for immunization: Secondary | ICD-10-CM | POA: Diagnosis not present

## 2023-09-15 DIAGNOSIS — Z1331 Encounter for screening for depression: Secondary | ICD-10-CM | POA: Diagnosis not present

## 2023-09-15 DIAGNOSIS — I129 Hypertensive chronic kidney disease with stage 1 through stage 4 chronic kidney disease, or unspecified chronic kidney disease: Secondary | ICD-10-CM | POA: Diagnosis not present

## 2023-09-15 DIAGNOSIS — F32A Depression, unspecified: Secondary | ICD-10-CM | POA: Diagnosis not present

## 2023-09-15 DIAGNOSIS — Z0000 Encounter for general adult medical examination without abnormal findings: Secondary | ICD-10-CM | POA: Diagnosis not present

## 2023-09-15 DIAGNOSIS — R569 Unspecified convulsions: Secondary | ICD-10-CM | POA: Diagnosis not present

## 2023-09-15 DIAGNOSIS — E559 Vitamin D deficiency, unspecified: Secondary | ICD-10-CM | POA: Diagnosis not present

## 2023-09-15 DIAGNOSIS — N183 Chronic kidney disease, stage 3 unspecified: Secondary | ICD-10-CM | POA: Diagnosis not present

## 2023-09-21 ENCOUNTER — Ambulatory Visit
Admission: RE | Admit: 2023-09-21 | Discharge: 2023-09-21 | Disposition: A | Discharge status: Home / Self Care | Payer: Medicare HMO | Source: Ambulatory Visit | Attending: Internal Medicine | Admitting: Internal Medicine

## 2023-09-21 DIAGNOSIS — Z1231 Encounter for screening mammogram for malignant neoplasm of breast: Secondary | ICD-10-CM | POA: Insufficient documentation

## 2023-09-23 DIAGNOSIS — M3501 Sicca syndrome with keratoconjunctivitis: Secondary | ICD-10-CM | POA: Diagnosis not present

## 2023-09-23 DIAGNOSIS — H26491 Other secondary cataract, right eye: Secondary | ICD-10-CM | POA: Diagnosis not present

## 2023-10-27 ENCOUNTER — Ambulatory Visit: Payer: Medicare HMO | Admitting: Dermatology

## 2023-10-27 VITALS — BP 111/66

## 2023-10-27 DIAGNOSIS — L578 Other skin changes due to chronic exposure to nonionizing radiation: Secondary | ICD-10-CM

## 2023-10-27 DIAGNOSIS — L918 Other hypertrophic disorders of the skin: Secondary | ICD-10-CM

## 2023-10-27 DIAGNOSIS — W908XXA Exposure to other nonionizing radiation, initial encounter: Secondary | ICD-10-CM

## 2023-10-27 DIAGNOSIS — L821 Other seborrheic keratosis: Secondary | ICD-10-CM | POA: Diagnosis not present

## 2023-10-27 DIAGNOSIS — L820 Inflamed seborrheic keratosis: Secondary | ICD-10-CM

## 2023-10-27 DIAGNOSIS — Z79899 Other long term (current) drug therapy: Secondary | ICD-10-CM | POA: Diagnosis not present

## 2023-10-27 DIAGNOSIS — Z7189 Other specified counseling: Secondary | ICD-10-CM | POA: Diagnosis not present

## 2023-10-27 DIAGNOSIS — L649 Androgenic alopecia, unspecified: Secondary | ICD-10-CM | POA: Diagnosis not present

## 2023-10-27 MED ORDER — MINOXIDIL 2.5 MG PO TABS: 2.5000 mg | ORAL_TABLET | Freq: Every day | ORAL | 2 refills | Status: DC

## 2023-10-27 NOTE — Patient Instructions (Addendum)
Start Biotin 2.5mg  1 pill a day Continue Minoxidil 2.5mg  1/2 pill a day   Cryotherapy Aftercare  Wash gently with soap and water everyday.   Apply Vaseline and Band-Aid daily until healed.   Due to recent changes in healthcare laws, you may see results of your pathology and/or laboratory studies on MyChart before the doctors have had a chance to review them. We understand that in some cases there may be results that are confusing or concerning to you. Please understand that not all results are received at the same time and often the doctors may need to interpret multiple results in order to provide you with the best plan of care or course of treatment. Therefore, we ask that you please give [place] 2 business days to thoroughly review all your results before contacting the office for clarification. Should we see a critical lab result, you will be contacted sooner.   If You Need Anything After Your Visit  If you have any questions or concerns for your doctor, please call our main line at [phone] and press option 4 to reach your doctor's medical assistant. If no one answers, please leave a voicemail as directed and we will return your call as soon as possible. Messages left after 4 pm will be answered the following business day.   You may also send [place] a message via MyChart. We typically respond to MyChart messages within 1-2 business days.  For prescription refills, please ask your pharmacy to contact our office. Our fax number is [phone].  If you have an urgent issue when the clinic is closed that cannot wait until the next business day, you can page your doctor at the number below.    Please note that while we do our best to be available for urgent issues outside of office hours, we are not available 24/7.   If you have an urgent issue and are unable to reach [place], you may choose to seek medical care at your doctor's office, retail clinic, urgent care center, or emergency room.  If you  have a medical emergency, please immediately call 911 or go to the emergency department.  Pager Numbers  - Dr. Gwen Pounds: [phone]  - Dr. Roseanne Reno: [phone]  - Dr. Katrinka Blazing: [phone]   In the event of inclement weather, please call our main line at [phone] for an update on the status of any delays or closures.  Dermatology Medication Tips: Please keep the boxes that topical medications come in in order to help keep track of the instructions about where and how to use these. Pharmacies typically print the medication instructions only on the boxes and not directly on the medication tubes.   If your medication is too expensive, please contact our office at [phone] option 4 or send [place] a message through MyChart.   We are unable to tell what your co-pay for medications will be in advance as this is different depending on your insurance coverage. However, we may be able to find a substitute medication at lower cost or fill out paperwork to get insurance to cover a needed medication.   If a prior authorization is required to get your medication covered by your insurance company, please allow [place] 1-2 business days to complete this process.  Drug prices often vary depending on where the prescription is filled and some pharmacies may offer cheaper prices.  The website www.goodrx.com contains coupons for medications through different pharmacies. The prices here do not account for what the cost may be  with help from insurance (it may be cheaper with your insurance), but the website can give you the price if you did not use any insurance.  - You can print the associated coupon and take it with your prescription to the pharmacy.  - You may also stop by our office during regular business hours and pick up a GoodRx coupon card.  - If you need your prescription sent electronically to a different pharmacy, notify our office through [facility] or by phone at [phone] option  4.     Si Usted Necesita Algo Despus de Su Visita  Tambin puede enviarnos un mensaje a travs de Clinical cytogeneticist. Por lo general respondemos a los mensajes de MyChart en el transcurso de 1 a 2 das hbiles.  Para renovar recetas, por favor pida a su farmacia que se ponga en contacto con nuestra oficina. Annie Sable de fax es [place] [phone].  Si tiene un asunto urgente cuando la clnica est cerrada y que no puede esperar hasta el siguiente da hbil, puede llamar/localizar a su doctor(a) al nmero que aparece a continuacin.   Por favor, tenga en cuenta que aunque hacemos todo lo posible para estar disponibles para asuntos urgentes fuera del horario de [place], no estamos disponibles las 24 horas del da, los 7 [address]  [address] de la [place].   Si tiene un problema urgente y no puede comunicarse con nosotros, puede optar por buscar atencin mdica  en el consultorio de su doctor(a), en una clnica privada, en un centro de atencin urgente o en una sala de emergencias.  Si tiene Engineer, drilling, por favor llame inmediatamente al 911 o vaya a la sala de emergencias.  Nmeros de bper  - Dr. Gwen Pounds: [phone]  - Dra. Roseanne Reno: [phone]  - Dr. Katrinka Blazing: [phone]   En caso de inclemencias del tiempo, por favor llame a Lacy Duverney principal al [phone] para una actualizacin sobre el [place] de cualquier retraso o cierre.  Consejos para la medicacin en dermatologa: Por favor, guarde las cajas en las que vienen los medicamentos de uso tpico para ayudarle a seguir las instrucciones sobre dnde y cmo usarlos. Las farmacias generalmente imprimen las instrucciones del medicamento slo en las cajas y no directamente en los tubos del [place].   Si su medicamento es muy caro, por favor, pngase en contacto con Rolm Gala llamando al [phone] y presione la opcin 4 o envenos un mensaje a travs de Clinical cytogeneticist.   No podemos decirle cul ser su copago por los medicamentos por  adelantado ya que esto es diferente dependiendo de la cobertura de su seguro. Sin embargo, es posible que podamos encontrar un medicamento sustituto a Audiological scientist un formulario para que el seguro cubra el medicamento que se considera necesario.   Si se requiere una autorizacin previa para que su compaa de seguros [place] su medicamento, por favor permtanos de 1 a 2 das hbiles para completar [address]  Los precios de los medicamentos varan con frecuencia dependiendo del Environmental consultant de dnde se surte la receta y alguna farmacias pueden ofrecer precios ms baratos.  El sitio web www.goodrx.com tiene cupones para medicamentos de Health and safety inspector. Los precios aqu no tienen en cuenta lo que podra costar con la ayuda del seguro (puede ser ms barato con su seguro), pero el sitio web puede darle el precio si no utiliz Tourist information centre manager.  - Puede imprimir el cupn correspondiente y llevarlo con su receta a la farmacia.  - Tambin puede pasar por Rolm Gala  durante el horario de atencin regular y Education officer, museum una tarjeta de cupones de GoodRx.  - Si necesita que su receta se enve electrnicamente a una farmacia diferente, informe a nuestra oficina a travs de MyChart de [place] o por telfono llamando al [phone] y presione la opcin 4.

## 2023-10-27 NOTE — Progress Notes (Signed)
Follow-Up Visit   Subjective  Abigail Moody is a 77 y.o. female who presents for the following: ISK 47m f/u, face Alopecia scalp, 67m f/u, Minoxidil 2.5mg  1/2 po qd, pt feels like hair is growing The patient has spots, moles and lesions to be evaluated, some may be new or changing and the patient may have concern these could be cancer.   The following portions of the chart were reviewed this encounter and updated as appropriate: medications, allergies, medical history  Review of Systems:  No other skin or systemic complaints except as noted in HPI or Assessment and Plan.  Objective  Well appearing patient in no apparent distress; mood and affect are within normal limits.   A focused examination was performed of the following areas: Scalp,   Relevant exam findings are noted in the Assessment and Plan.  face, neck x 30 (30) Stuck on waxy paps with erythema  neck x 2 (2) Fleshy, skin-colored pedunculated papules.      Assessment & Plan   ANDROGENETIC ALOPECIA (FEMALE PATTERN HAIR LOSS) Scalp BP today 111/66 Exam: Diffuse thinning of the crown and widening of the midline part with retention of the frontal hairline  Chronic and persistent condition with duration or expected duration over one year. Condition is improving with treatment but not currently at goal.  Female Androgenic Alopecia is a chronic condition related to genetics and/or hormonal changes.  In women androgenetic alopecia is commonly associated with menopause but may occur any time after puberty.  It causes hair thinning primarily on the crown with widening of the part and temporal hairline recession.  Can use OTC Rogaine (minoxidil) 5% solution/foam as directed.  Oral treatments in female patients who have no contraindication may include : - Low dose oral minoxidil 1.25 - 5mg  daily - Spironolactone 50 - 100mg  bid - Finasteride 2.5 - 5 mg daily Adjunctive therapies include: - Low Level Laser Light Therapy  (LLLT) - Platelet-rich plasma injections (PRP) - Hair Transplants or scalp reduction   Treatment Plan: Cont Minoxidil 2.5mg  1/2 po qd Recommend Biotin 2.5mg  1 po qd  Doses of minoxidil for hair loss are considered 'low dose'. This is because the doses used for hair loss are much lower than the doses which are used for conditions such as high blood pressure (hypertension). The doses used for hypertension are 10-40mg  per day.  Side effects are uncommon at the low doses (up to 2.5 mg/day) used to treat hair loss. Potential side effects, more commonly seen at higher doses, include: Increase in hair growth (hypertrichosis) elsewhere on face and body Temporary hair shedding upon starting medication which may last up to 4 weeks Ankle swelling, fluid retention, rapid weight gain more than 5 pounds Low blood pressure and feeling lightheaded or dizzy when standing up quickly Fast or irregular heartbeat Headaches   Long term medication management.  Patient is using long term (months to years) prescription medication  to control their dermatologic condition.  These medications require periodic monitoring to evaluate for efficacy and side effects and may require periodic laboratory monitoring.    Inflamed seborrheic keratosis (30) face, neck x 30  Symptomatic, irritating, patient would like treated.  Destruction of lesion - face, neck x 30 (30) Complexity: simple   Destruction method: cryotherapy   Informed consent: discussed and consent obtained   Timeout:  patient name, date of birth, surgical site, and procedure verified Lesion destroyed using liquid nitrogen: Yes   Region frozen until ice ball extended beyond lesion:  Yes   Outcome: patient tolerated procedure well with no complications   Post-procedure details: wound care instructions given    Skin tag (2) neck x 2  Symptomatic, irritating, patient would like treated.   Destruction of lesion - neck x 2 (2) Complexity: simple    Destruction method: cryotherapy   Informed consent: discussed and consent obtained   Timeout:  patient name, date of birth, surgical site, and procedure verified Lesion destroyed using liquid nitrogen: Yes   Region frozen until ice ball extended beyond lesion: Yes   Outcome: patient tolerated procedure well with no complications   Post-procedure details: wound care instructions given     SEBORRHEIC KERATOSIS - Stuck-on, waxy, tan-brown papules and/or plaques  - Benign-appearing - Discussed benign etiology and prognosis. - Observe - Call for any changes  ACTINIC DAMAGE - chronic, secondary to cumulative UV radiation exposure/sun exposure over time - diffuse scaly erythematous macules with underlying dyspigmentation - Recommend daily broad spectrum sunscreen SPF 30+ to sun-exposed areas, reapply every 2 hours as needed.  - Recommend staying in the shade or wearing long sleeves, sun glasses (UVA+UVB protection) and wide brim hats (4-inch brim around the entire circumference of the hat). - Call for new or changing lesions.  Return in about 1 year (around 10/26/2024) for Alopecia.  I, Ardis Rowan, RMA, am acting as scribe for Armida Sans, MD .   Documentation: I have reviewed the above documentation for accuracy and completeness, and I agree with the above.  Armida Sans, MD

## 2023-11-03 ENCOUNTER — Encounter: Payer: Self-pay | Admitting: Dermatology

## 2024-03-08 DIAGNOSIS — N183 Chronic kidney disease, stage 3 unspecified: Secondary | ICD-10-CM | POA: Diagnosis not present

## 2024-03-08 DIAGNOSIS — I779 Disorder of arteries and arterioles, unspecified: Secondary | ICD-10-CM | POA: Diagnosis not present

## 2024-03-08 DIAGNOSIS — I10 Essential (primary) hypertension: Secondary | ICD-10-CM | POA: Diagnosis not present

## 2024-03-08 DIAGNOSIS — K219 Gastro-esophageal reflux disease without esophagitis: Secondary | ICD-10-CM | POA: Diagnosis not present

## 2024-03-08 DIAGNOSIS — E7849 Other hyperlipidemia: Secondary | ICD-10-CM | POA: Diagnosis not present

## 2024-03-15 DIAGNOSIS — I779 Disorder of arteries and arterioles, unspecified: Secondary | ICD-10-CM | POA: Diagnosis not present

## 2024-03-15 DIAGNOSIS — I129 Hypertensive chronic kidney disease with stage 1 through stage 4 chronic kidney disease, or unspecified chronic kidney disease: Secondary | ICD-10-CM | POA: Diagnosis not present

## 2024-03-15 DIAGNOSIS — K219 Gastro-esophageal reflux disease without esophagitis: Secondary | ICD-10-CM | POA: Diagnosis not present

## 2024-03-15 DIAGNOSIS — N183 Chronic kidney disease, stage 3 unspecified: Secondary | ICD-10-CM | POA: Diagnosis not present

## 2024-03-15 DIAGNOSIS — F3342 Major depressive disorder, recurrent, in full remission: Secondary | ICD-10-CM | POA: Diagnosis not present

## 2024-03-15 DIAGNOSIS — G3184 Mild cognitive impairment, so stated: Secondary | ICD-10-CM | POA: Diagnosis not present

## 2024-03-15 DIAGNOSIS — G40909 Epilepsy, unspecified, not intractable, without status epilepticus: Secondary | ICD-10-CM | POA: Diagnosis not present

## 2024-03-15 DIAGNOSIS — E559 Vitamin D deficiency, unspecified: Secondary | ICD-10-CM | POA: Diagnosis not present

## 2024-03-15 DIAGNOSIS — E785 Hyperlipidemia, unspecified: Secondary | ICD-10-CM | POA: Diagnosis not present

## 2024-03-29 DIAGNOSIS — M19012 Primary osteoarthritis, left shoulder: Secondary | ICD-10-CM | POA: Diagnosis not present

## 2024-03-29 DIAGNOSIS — G8929 Other chronic pain: Secondary | ICD-10-CM | POA: Diagnosis not present

## 2024-03-29 DIAGNOSIS — M7542 Impingement syndrome of left shoulder: Secondary | ICD-10-CM | POA: Diagnosis not present

## 2024-03-29 DIAGNOSIS — M778 Other enthesopathies, not elsewhere classified: Secondary | ICD-10-CM | POA: Diagnosis not present

## 2024-03-29 DIAGNOSIS — M8588 Other specified disorders of bone density and structure, other site: Secondary | ICD-10-CM | POA: Diagnosis not present

## 2024-03-29 DIAGNOSIS — M7552 Bursitis of left shoulder: Secondary | ICD-10-CM | POA: Diagnosis not present

## 2024-03-29 DIAGNOSIS — M25512 Pain in left shoulder: Secondary | ICD-10-CM | POA: Diagnosis not present

## 2024-04-14 DIAGNOSIS — M6281 Muscle weakness (generalized): Secondary | ICD-10-CM | POA: Diagnosis not present

## 2024-04-14 DIAGNOSIS — M25512 Pain in left shoulder: Secondary | ICD-10-CM | POA: Diagnosis not present

## 2024-04-14 DIAGNOSIS — G8929 Other chronic pain: Secondary | ICD-10-CM | POA: Diagnosis not present

## 2024-04-14 DIAGNOSIS — M25612 Stiffness of left shoulder, not elsewhere classified: Secondary | ICD-10-CM | POA: Diagnosis not present

## 2024-04-26 DIAGNOSIS — G8929 Other chronic pain: Secondary | ICD-10-CM | POA: Diagnosis not present

## 2024-04-26 DIAGNOSIS — M6281 Muscle weakness (generalized): Secondary | ICD-10-CM | POA: Diagnosis not present

## 2024-04-26 DIAGNOSIS — M25612 Stiffness of left shoulder, not elsewhere classified: Secondary | ICD-10-CM | POA: Diagnosis not present

## 2024-04-26 DIAGNOSIS — M25512 Pain in left shoulder: Secondary | ICD-10-CM | POA: Diagnosis not present

## 2024-05-02 DIAGNOSIS — G8929 Other chronic pain: Secondary | ICD-10-CM | POA: Diagnosis not present

## 2024-05-02 DIAGNOSIS — M25612 Stiffness of left shoulder, not elsewhere classified: Secondary | ICD-10-CM | POA: Diagnosis not present

## 2024-05-02 DIAGNOSIS — M6281 Muscle weakness (generalized): Secondary | ICD-10-CM | POA: Diagnosis not present

## 2024-05-02 DIAGNOSIS — M25512 Pain in left shoulder: Secondary | ICD-10-CM | POA: Diagnosis not present

## 2024-05-04 DIAGNOSIS — M25612 Stiffness of left shoulder, not elsewhere classified: Secondary | ICD-10-CM | POA: Diagnosis not present

## 2024-05-04 DIAGNOSIS — M6281 Muscle weakness (generalized): Secondary | ICD-10-CM | POA: Diagnosis not present

## 2024-05-04 DIAGNOSIS — G8929 Other chronic pain: Secondary | ICD-10-CM | POA: Diagnosis not present

## 2024-05-04 DIAGNOSIS — M25512 Pain in left shoulder: Secondary | ICD-10-CM | POA: Diagnosis not present

## 2024-05-09 DIAGNOSIS — M778 Other enthesopathies, not elsewhere classified: Secondary | ICD-10-CM | POA: Diagnosis not present

## 2024-05-09 DIAGNOSIS — M7552 Bursitis of left shoulder: Secondary | ICD-10-CM | POA: Diagnosis not present

## 2024-05-09 DIAGNOSIS — G8929 Other chronic pain: Secondary | ICD-10-CM | POA: Diagnosis not present

## 2024-05-09 DIAGNOSIS — M25512 Pain in left shoulder: Secondary | ICD-10-CM | POA: Diagnosis not present

## 2024-05-09 DIAGNOSIS — M7542 Impingement syndrome of left shoulder: Secondary | ICD-10-CM | POA: Diagnosis not present

## 2024-05-09 DIAGNOSIS — M19012 Primary osteoarthritis, left shoulder: Secondary | ICD-10-CM | POA: Diagnosis not present

## 2024-05-11 ENCOUNTER — Other Ambulatory Visit: Payer: Self-pay | Admitting: Sports Medicine

## 2024-05-11 DIAGNOSIS — M19012 Primary osteoarthritis, left shoulder: Secondary | ICD-10-CM

## 2024-05-11 DIAGNOSIS — M7552 Bursitis of left shoulder: Secondary | ICD-10-CM

## 2024-05-11 DIAGNOSIS — M778 Other enthesopathies, not elsewhere classified: Secondary | ICD-10-CM

## 2024-05-11 DIAGNOSIS — G8929 Other chronic pain: Secondary | ICD-10-CM

## 2024-05-11 DIAGNOSIS — M7542 Impingement syndrome of left shoulder: Secondary | ICD-10-CM

## 2024-05-19 ENCOUNTER — Ambulatory Visit
Admission: RE | Admit: 2024-05-19 | Discharge: 2024-05-19 | Disposition: A | Discharge status: Home / Self Care | Source: Ambulatory Visit | Attending: Sports Medicine | Admitting: Sports Medicine

## 2024-05-19 DIAGNOSIS — M778 Other enthesopathies, not elsewhere classified: Secondary | ICD-10-CM | POA: Diagnosis not present

## 2024-05-19 DIAGNOSIS — M7552 Bursitis of left shoulder: Secondary | ICD-10-CM | POA: Insufficient documentation

## 2024-05-19 DIAGNOSIS — M25512 Pain in left shoulder: Secondary | ICD-10-CM | POA: Diagnosis not present

## 2024-05-19 DIAGNOSIS — M129 Arthropathy, unspecified: Secondary | ICD-10-CM | POA: Diagnosis not present

## 2024-05-19 DIAGNOSIS — M7542 Impingement syndrome of left shoulder: Secondary | ICD-10-CM | POA: Diagnosis not present

## 2024-05-19 DIAGNOSIS — S43432A Superior glenoid labrum lesion of left shoulder, initial encounter: Secondary | ICD-10-CM | POA: Diagnosis not present

## 2024-05-19 DIAGNOSIS — M19012 Primary osteoarthritis, left shoulder: Secondary | ICD-10-CM | POA: Diagnosis not present

## 2024-05-19 DIAGNOSIS — M7582 Other shoulder lesions, left shoulder: Secondary | ICD-10-CM | POA: Diagnosis not present

## 2024-05-19 DIAGNOSIS — G8929 Other chronic pain: Secondary | ICD-10-CM | POA: Insufficient documentation

## 2024-05-30 DIAGNOSIS — M8580 Other specified disorders of bone density and structure, unspecified site: Secondary | ICD-10-CM | POA: Diagnosis not present

## 2024-06-22 DIAGNOSIS — G8929 Other chronic pain: Secondary | ICD-10-CM | POA: Diagnosis not present

## 2024-06-22 DIAGNOSIS — M67912 Unspecified disorder of synovium and tendon, left shoulder: Secondary | ICD-10-CM | POA: Diagnosis not present

## 2024-06-22 DIAGNOSIS — M75112 Incomplete rotator cuff tear or rupture of left shoulder, not specified as traumatic: Secondary | ICD-10-CM | POA: Diagnosis not present

## 2024-06-22 DIAGNOSIS — M19012 Primary osteoarthritis, left shoulder: Secondary | ICD-10-CM | POA: Diagnosis not present

## 2024-06-22 DIAGNOSIS — M7552 Bursitis of left shoulder: Secondary | ICD-10-CM | POA: Diagnosis not present

## 2024-06-22 DIAGNOSIS — M7542 Impingement syndrome of left shoulder: Secondary | ICD-10-CM | POA: Diagnosis not present

## 2024-06-22 DIAGNOSIS — M25512 Pain in left shoulder: Secondary | ICD-10-CM | POA: Diagnosis not present

## 2024-07-13 ENCOUNTER — Ambulatory Visit: Attending: Internal Medicine | Admitting: Internal Medicine

## 2024-07-13 ENCOUNTER — Encounter: Payer: Self-pay | Admitting: Internal Medicine

## 2024-07-13 VITALS — BP 116/70 | HR 61 | Ht 62.5 in | Wt 172.2 lb

## 2024-07-13 DIAGNOSIS — I10 Essential (primary) hypertension: Secondary | ICD-10-CM

## 2024-07-13 DIAGNOSIS — E782 Mixed hyperlipidemia: Secondary | ICD-10-CM | POA: Diagnosis not present

## 2024-07-13 DIAGNOSIS — R002 Palpitations: Secondary | ICD-10-CM | POA: Diagnosis not present

## 2024-07-13 DIAGNOSIS — I38 Endocarditis, valve unspecified: Secondary | ICD-10-CM

## 2024-07-13 DIAGNOSIS — I6522 Occlusion and stenosis of left carotid artery: Secondary | ICD-10-CM | POA: Diagnosis not present

## 2024-07-13 NOTE — Patient Instructions (Signed)
 Medication Instructions:   Your physician recommends that you continue on your current medications as directed. Please refer to the Current Medication list given to you today.  *If you need a refill on your cardiac medications before your next appointment, please call your pharmacy*  Lab Work:  NONE  If you have labs (blood work) drawn today and your tests are completely normal, you will receive your results only by: MyChart Message (if you have MyChart) OR A paper copy in the mail If you have any lab test that is abnormal or we need to change your treatment, we will call you to review the results.  Testing/Procedures:  NONE  Follow-Up: At [facility], you and your health needs are our priority.  As part of our continuing mission to provide you with exceptional heart care, our providers are all part of one team.  This team includes your primary Cardiologist (physician) and Advanced Practice Providers or APPs (Physician Assistants and Nurse Practitioners) who all work together to provide you with the care you need, when you need it.  Your next appointment:   12 month(s)  Provider:   You may see Lonni Hanson, MD or one of the following Advanced Practice Providers on your designated Care Team:   Lonni Meager, NP Lesley Maffucci, PA-C Bernardino Bring, PA-C Cadence [place], PA-C Tylene Lunch, NP Barnie Hila, NP   We recommend signing up for the patient portal called MyChart.  Sign up information is provided on this After Visit Summary.  MyChart is used to connect with patients for Virtual Visits (Telemedicine).  Patients are able to view lab/test results, encounter notes, upcoming appointments, etc.  Non-urgent messages can be sent to your provider as well.   To learn more about what you can do with MyChart, go to [email or web address].

## 2024-07-13 NOTE — Progress Notes (Unsigned)
  Cardiology Office Note:  .   Date:  07/15/2024  ID:  RIOT BARRICK, DOB 1945-12-23, MRN [id number] PCP: Fernande Ophelia JINNY DOUGLAS, MD  [facility] Providers Cardiologist:  Lonni Hanson, MD     History of Present Illness: Abigail Moody   DEZYRE HOEFER is a 78 y.o. female with history of carotid artery stenosis, hypertension, hyperlipidemia, obstructive sleep apnea, reactive airway disease, chronic kidney disease, seizure disorder with mild cognitive impairment, arthritis, acid reflux, and anxiety/depression, who presents for follow-up of palpitations.  I last saw her in 02/2023, at which time she noted some residual soreness in the left arm/shoulder similar to a previous rotator cuff issue.  She endorsed sporadic palpitations lasting only a few seconds at a time that seem to come on during stressful situations.  We did not make any medication changes or pursue additional testing.  Today, Ms. Malicoat reports that she has been feeling fairly well.  She has rare palpitations, which typically last less than 5 seconds and are without associated symptoms.  She sometimes has to clear her throat but has not had any frank shortness of breath.  She also denies chest pain, lightheadedness, and edema.  She does not check her blood pressure regularly at home.  She is scheduled for follow-up labs with Dr. Fernande in September.  ROS: See HPI  Studies Reviewed: Abigail Moody   EKG Interpretation Date/Time:  Wednesday July 13 2024 15:39:37 EDT Ventricular Rate:  61 PR Interval:  176 QRS Duration:  94 QT Interval:  428 QTC Calculation: 430 R Axis:   0  Text Interpretation: Normal sinus rhythm Low voltage QRS Borderline ECG When compared with ECG of 26-Nov-2022 Low voltage QRS is now Present Confirmed by Jeanenne Licea, Lonni [phone]) on 07/15/2024 7:21:57 AM     Risk Assessment/Calculations:         Physical Exam:   VS:  BP 116/70 (BP Location: Left Arm, Patient Position: Sitting, Cuff Size: Normal)   Pulse 61   Ht 5'  2.5 (1.588 m)   Wt 172 lb 4 oz (78.1 kg)   SpO2 98%   BMI 31.00 kg/m    Wt Readings from Last 3 Encounters:  07/13/24 172 lb 4 oz (78.1 kg)  03/04/23 174 lb (78.9 kg)  01/26/23 177 lb (80.3 kg)    General:  NAD. Neck: No JVD or HJR. Lungs: Clear to auscultation bilaterally without wheezes or crackles. Heart: Regular rate and rhythm without murmurs, rubs, or gallops. Abdomen: Soft, nontender, nondistended. Extremities: No lower extremity edema.  ASSESSMENT AND PLAN: .    Palpitations: Sporadic self-limited palpitations lasting only a few seconds noted again today by Ms. Laur.  Physical exam and EKG today are unrevealing.  No further workup or intervention at this time.  Hypertension: Blood pressure well-controlled today.  Continue current regimen of amlodipine and minoxidil  (latter use primarily for alopecia).  Carotid artery stenosis and hyperlipidemia: Mild carotid artery disease previously noted on Doppler examination.  No new neurologic symptoms noted.  Continue aspirin  and atorvastatin  for secondary prevention, with ongoing lipid follow-up per Dr. Fernande.    Dispo: Return to clinic in 1 year.  Signed, Lonni Hanson, MD

## 2024-07-15 ENCOUNTER — Encounter: Payer: Self-pay | Admitting: Internal Medicine

## 2024-07-26 DIAGNOSIS — H26493 Other secondary cataract, bilateral: Secondary | ICD-10-CM | POA: Diagnosis not present

## 2024-07-27 DIAGNOSIS — Z0100 Encounter for examination of eyes and vision without abnormal findings: Secondary | ICD-10-CM | POA: Diagnosis not present

## 2024-08-24 ENCOUNTER — Other Ambulatory Visit: Payer: Self-pay | Admitting: Internal Medicine

## 2024-08-24 DIAGNOSIS — Z1231 Encounter for screening mammogram for malignant neoplasm of breast: Secondary | ICD-10-CM

## 2024-09-09 DIAGNOSIS — I10 Essential (primary) hypertension: Secondary | ICD-10-CM | POA: Diagnosis not present

## 2024-09-09 DIAGNOSIS — N183 Chronic kidney disease, stage 3 unspecified: Secondary | ICD-10-CM | POA: Diagnosis not present

## 2024-09-09 DIAGNOSIS — M8580 Other specified disorders of bone density and structure, unspecified site: Secondary | ICD-10-CM | POA: Diagnosis not present

## 2024-09-09 DIAGNOSIS — E7849 Other hyperlipidemia: Secondary | ICD-10-CM | POA: Diagnosis not present

## 2024-09-09 DIAGNOSIS — R7309 Other abnormal glucose: Secondary | ICD-10-CM | POA: Diagnosis not present

## 2024-09-16 DIAGNOSIS — E785 Hyperlipidemia, unspecified: Secondary | ICD-10-CM | POA: Diagnosis not present

## 2024-09-16 DIAGNOSIS — Z1331 Encounter for screening for depression: Secondary | ICD-10-CM | POA: Diagnosis not present

## 2024-09-16 DIAGNOSIS — R7303 Prediabetes: Secondary | ICD-10-CM | POA: Diagnosis not present

## 2024-09-16 DIAGNOSIS — Z23 Encounter for immunization: Secondary | ICD-10-CM | POA: Diagnosis not present

## 2024-09-16 DIAGNOSIS — I129 Hypertensive chronic kidney disease with stage 1 through stage 4 chronic kidney disease, or unspecified chronic kidney disease: Secondary | ICD-10-CM | POA: Diagnosis not present

## 2024-09-16 DIAGNOSIS — F32A Depression, unspecified: Secondary | ICD-10-CM | POA: Diagnosis not present

## 2024-09-16 DIAGNOSIS — N1832 Chronic kidney disease, stage 3b: Secondary | ICD-10-CM | POA: Diagnosis not present

## 2024-09-16 DIAGNOSIS — Z0000 Encounter for general adult medical examination without abnormal findings: Secondary | ICD-10-CM | POA: Diagnosis not present

## 2024-09-16 DIAGNOSIS — G40109 Localization-related (focal) (partial) symptomatic epilepsy and epileptic syndromes with simple partial seizures, not intractable, without status epilepticus: Secondary | ICD-10-CM | POA: Diagnosis not present

## 2024-09-22 ENCOUNTER — Ambulatory Visit
Admission: RE | Admit: 2024-09-22 | Discharge: 2024-09-22 | Disposition: A | Discharge status: Home / Self Care | Source: Ambulatory Visit | Attending: Internal Medicine | Admitting: Internal Medicine

## 2024-09-22 DIAGNOSIS — Z1231 Encounter for screening mammogram for malignant neoplasm of breast: Secondary | ICD-10-CM | POA: Insufficient documentation

## 2024-10-05 DIAGNOSIS — M8580 Other specified disorders of bone density and structure, unspecified site: Secondary | ICD-10-CM | POA: Diagnosis not present

## 2024-10-21 ENCOUNTER — Other Ambulatory Visit: Payer: Self-pay | Admitting: Dermatology

## 2024-10-21 DIAGNOSIS — L649 Androgenic alopecia, unspecified: Secondary | ICD-10-CM

## 2024-10-26 ENCOUNTER — Ambulatory Visit: Payer: Medicare HMO | Admitting: Dermatology

## 2024-10-26 DIAGNOSIS — W908XXA Exposure to other nonionizing radiation, initial encounter: Secondary | ICD-10-CM | POA: Diagnosis not present

## 2024-10-26 DIAGNOSIS — L578 Other skin changes due to chronic exposure to nonionizing radiation: Secondary | ICD-10-CM

## 2024-10-26 DIAGNOSIS — L649 Androgenic alopecia, unspecified: Secondary | ICD-10-CM | POA: Diagnosis not present

## 2024-10-26 DIAGNOSIS — L821 Other seborrheic keratosis: Secondary | ICD-10-CM | POA: Diagnosis not present

## 2024-10-26 DIAGNOSIS — Z79899 Other long term (current) drug therapy: Secondary | ICD-10-CM | POA: Diagnosis not present

## 2024-10-26 DIAGNOSIS — Z7189 Other specified counseling: Secondary | ICD-10-CM

## 2024-10-26 NOTE — Progress Notes (Signed)
 Follow-Up Visit   Subjective  Abigail Moody is a 78 y.o. female who presents for the following: androgenetic alopecia - minoxidil  pt taking 1.25 mg po QD and biotin and tolerating well with no s/e, she takes it before bed now, because it did make her feel funny when she took it in the morning.  The following portions of the chart were reviewed this encounter and updated as appropriate: medications, allergies, medical history  Review of Systems:  No other skin or systemic complaints except as noted in HPI or Assessment and Plan.  Objective  Well appearing patient in no apparent distress; mood and affect are within normal limits.  A focused examination was performed of the following areas: the face and scalp  Relevant exam findings are noted in the Assessment and Plan.    Assessment & Plan   SEBORRHEIC KERATOSIS - Stuck-on, waxy, tan-brown papules and/or plaques  - Benign-appearing - Discussed benign etiology and prognosis. - Observe - Call for any changes  ACTINIC DAMAGE - chronic, secondary to cumulative UV radiation exposure/sun exposure over time - diffuse scaly erythematous macules with underlying dyspigmentation - Recommend daily broad spectrum sunscreen SPF 30+ to sun-exposed areas, reapply every 2 hours as needed.  - Recommend staying in the shade or wearing long sleeves, sun glasses (UVA+UVB protection) and wide brim hats (4-inch brim around the entire circumference of the hat). - Call for new or changing lesions.  ANDROGENETIC ALOPECIA (FEMALE PATTERN HAIR LOSS)      ANDROGENETIC ALOPECIA (FEMALE PATTERN HAIR LOSS)  Scalp - improving on minoxidil  1.25 mg po QD compared to previous photos  Exam: Diffuse thinning of the crown and widening of the midline part with retention of the frontal hairline   Chronic and persistent condition with duration or expected duration over one year. Condition is improving with treatment but not currently at goal.   Female  Androgenic Alopecia is a chronic condition related to genetics and/or hormonal changes.  In women androgenetic alopecia is commonly associated with menopause but may occur any time after puberty.  It causes hair thinning primarily on the crown with widening of the part and temporal hairline recession.  Can use OTC Rogaine  (minoxidil ) 5% solution/foam as directed.  Oral treatments in female patients who have no contraindication may include : - Low dose oral minoxidil  1.25 - 5mg  daily - Spironolactone 50 - 100mg  bid - Finasteride 2.5 - 5 mg daily Adjunctive therapies include: - Low Level Laser Light Therapy (LLLT) - Platelet-rich plasma injections (PRP) - Hair Transplants or scalp reduction    Treatment Plan: Cont Minoxidil  2.5mg  1/2 po qd Recommend Biotin 2.5mg  1 po qd   Doses of minoxidil  for hair loss are considered 'low dose'. This is because the doses used for hair loss are much lower than the doses which are used for conditions such as high blood pressure (hypertension). The doses used for hypertension are 10-40mg  per day.  Side effects are uncommon at the low doses (up to 2.5 mg/day) used to treat hair loss. Potential side effects, more commonly seen at higher doses, include: Increase in hair growth (hypertrichosis) elsewhere on face and body Temporary hair shedding upon starting medication which may last up to 4 weeks Ankle swelling, fluid retention, rapid weight gain more than 5 pounds Low blood pressure and feeling lightheaded or dizzy when standing up quickly Fast or irregular heartbeat Headaches    Long term medication management.  Patient is using long term (months to years) prescription medication  to control their dermatologic condition.  These medications require periodic monitoring to evaluate for efficacy and side effects and may require periodic laboratory monitoring.     Return in about 1 year (around 10/26/2025) for androgenetic alopecia follow up.  LILLETTE Rosina Mayans, CMA,  am acting as scribe for Alm Rhyme, MD .   Documentation: I have reviewed the above documentation for accuracy and completeness, and I agree with the above.  Alm Rhyme, MD

## 2024-10-26 NOTE — Patient Instructions (Signed)

## 2024-11-01 ENCOUNTER — Encounter: Payer: Self-pay | Admitting: Dermatology

## 2025-01-22 DEATH — deceased

## 2025-11-01 ENCOUNTER — Ambulatory Visit: Admitting: Dermatology
